# Patient Record
Sex: Male | Born: 1950 | Race: White | Hispanic: No | Marital: Married
Health system: Midwestern US, Community
[De-identification: ages and names within clinical notes are randomized; demographics above are authoritative.]

## PROBLEM LIST (undated history)

## (undated) DIAGNOSIS — E119 Type 2 diabetes mellitus without complications: Secondary | ICD-10-CM

## (undated) DIAGNOSIS — I10 Essential (primary) hypertension: Secondary | ICD-10-CM

## (undated) DIAGNOSIS — K219 Gastro-esophageal reflux disease without esophagitis: Secondary | ICD-10-CM

## (undated) DIAGNOSIS — E7800 Pure hypercholesterolemia, unspecified: Secondary | ICD-10-CM

## (undated) DIAGNOSIS — M48062 Spinal stenosis, lumbar region with neurogenic claudication: Secondary | ICD-10-CM

## (undated) DIAGNOSIS — I2510 Atherosclerotic heart disease of native coronary artery without angina pectoris: Secondary | ICD-10-CM

## (undated) DIAGNOSIS — R9439 Abnormal result of other cardiovascular function study: Principal | ICD-10-CM

## (undated) DIAGNOSIS — E782 Mixed hyperlipidemia: Secondary | ICD-10-CM

## (undated) DIAGNOSIS — Z955 Presence of coronary angioplasty implant and graft: Secondary | ICD-10-CM

## (undated) DIAGNOSIS — Z125 Encounter for screening for malignant neoplasm of prostate: Secondary | ICD-10-CM

## (undated) DIAGNOSIS — D649 Anemia, unspecified: Secondary | ICD-10-CM

## (undated) DIAGNOSIS — R1013 Epigastric pain: Secondary | ICD-10-CM

## (undated) DIAGNOSIS — F419 Anxiety disorder, unspecified: Principal | ICD-10-CM

## (undated) DIAGNOSIS — R7401 Elevation of levels of liver transaminase levels: Secondary | ICD-10-CM

## (undated) DIAGNOSIS — M5431 Sciatica, right side: Secondary | ICD-10-CM

## (undated) DIAGNOSIS — M5432 Sciatica, left side: Secondary | ICD-10-CM

## (undated) DIAGNOSIS — M5441 Lumbago with sciatica, right side: Secondary | ICD-10-CM

## (undated) DIAGNOSIS — I255 Ischemic cardiomyopathy: Secondary | ICD-10-CM

## (undated) DIAGNOSIS — M5442 Lumbago with sciatica, left side: Secondary | ICD-10-CM

## (undated) HISTORY — PX: KNEE SURGERY: SHX244

## (undated) HISTORY — PX: BACK SURGERY: SHX140

---

## 2017-10-08 LAB — COMPREHENSIVE METABOLIC PANEL
ALT: 13 U/L (ref 0–41)
AST: 14 U/L (ref 0–40)
Albumin/Globulin Ratio: 2.1 mmol/L — ABNORMAL HIGH (ref 1.00–2.00)
Albumin: 4.7 g/dL (ref 3.5–5.2)
Alk Phosphatase: 36 U/L — ABNORMAL LOW (ref 40–130)
Anion Gap: 14 mmol/L (ref 2–17)
BUN: 20 mg/dL (ref 8–23)
CO2: 27 mmol/L (ref 22–29)
Calcium: 9.7 mg/dL (ref 8.8–10.2)
Chloride: 101 mmol/L (ref 98–107)
Creatinine: 1 mg/dL (ref 0.7–1.3)
GFR African American: 89 mL/min/{1.73_m2} — ABNORMAL LOW (ref >=90)
GFR Non-African American: 77 mL/min/{1.73_m2} — ABNORMAL LOW (ref >=90)
Globulin: 2 g/dL (ref 1.9–4.4)
Glucose: 108 mg/dL — ABNORMAL HIGH (ref 70–99)
Potassium: 4.3 mmol/L (ref 3.5–5.3)
Sodium: 142 mmol/L (ref 135–145)
Total Bilirubin: 0.4 mg/dL (ref 0.00–1.20)
Total Protein: 6.9 g/dL (ref 6.4–8.3)

## 2017-10-08 LAB — HEMOGLOBIN A1C
Est. Avg. Glucose, WB: 120
Est. Avg. Glucose-calculated: 129
Hemoglobin A1C: 5.8 % (ref 4.0–6.0)

## 2017-10-08 LAB — LIPID PANEL
Chol/HDL Ratio: 3.2 (ref 0.0–4.4)
Cholesterol: 174 mg/dL (ref 100–200)
HDL: 54 mg/dL — ABNORMAL LOW (ref 55–72)
LDL Cholesterol: 96.2 mg/dL (ref 0.0–100.0)
LDL/HDL Ratio: 1.8
Triglycerides: 119 mg/dL (ref 0–149)
VLDL: 23.8 mg/dL (ref 5.0–40.0)

## 2018-01-21 LAB — COMPREHENSIVE METABOLIC PANEL
ALT: 15 U/L (ref 0–41)
AST: 18 U/L (ref 0–40)
Albumin/Globulin Ratio: 2.1 mmol/L — ABNORMAL HIGH (ref 1.00–2.00)
Albumin: 4.6 g/dL (ref 3.5–5.2)
Alk Phosphatase: 39 U/L — ABNORMAL LOW (ref 40–130)
Anion Gap: 12 mmol/L (ref 2–17)
BUN: 17 mg/dL (ref 8–23)
CO2: 26 mmol/L (ref 22–29)
Calcium: 9.7 mg/dL (ref 8.8–10.2)
Chloride: 103 mmol/L (ref 98–107)
Creatinine: 1 mg/dL (ref 0.7–1.3)
GFR African American: 89 mL/min/{1.73_m2} — ABNORMAL LOW (ref >=90)
GFR Non-African American: 76 mL/min/{1.73_m2} — ABNORMAL LOW (ref >=90)
Globulin: 2 g/dL (ref 1.9–4.4)
Glucose: 113 mg/dL — ABNORMAL HIGH (ref 70–99)
OSMOLALITY CALCULATED: 284 mOsm/kg (ref 270–287)
Potassium: 4.5 mmol/L (ref 3.5–5.3)
Sodium: 141 mmol/L (ref 135–145)
Total Bilirubin: 0.5 mg/dL (ref 0.00–1.20)
Total Protein: 6.8 g/dL (ref 6.4–8.3)

## 2018-01-21 LAB — CBC WITH AUTO DIFFERENTIAL
Absolute Baso #: 0 10*3/uL (ref 0.0–0.2)
Absolute Eos #: 0.1 10*3/uL (ref 0.0–0.5)
Absolute Lymph #: 1 10*3/uL (ref 1.0–3.2)
Absolute Mono #: 0.3 10*3/uL (ref 0.3–1.0)
Basophils %: 0.7 % (ref 0.0–2.0)
Eosinophils %: 1.4 % (ref 0.0–7.0)
Hematocrit: 42 % (ref 38.0–52.0)
Hemoglobin: 13.7 g/dL (ref 12.0–17.3)
Immature Grans (Abs): 0 10*3/uL
Immature Granulocytes: 0.5 %
Lymphocytes: 23.3 % (ref 15.0–45.0)
MCH: 29.9 pg (ref 27.0–34.5)
MCHC: 32.6 g/dL (ref 32.0–36.0)
MCV: 91.7 fL (ref 84.0–100.0)
MPV: 11.7 fL (ref 7.2–13.2)
Monocytes: 8 % (ref 4.0–12.0)
NRBC Absolute: 0 10*3/uL
NRBC Automated: 0 %
Neutrophils %: 66.1 % (ref 42.0–74.0)
Neutrophils Absolute: 2.8 10*3/uL (ref 1.6–7.3)
Platelets: 178 10*3/uL (ref 140–440)
RBC: 4.58 x10e6/mcL (ref 4.00–5.20)
RDW: 11.8 % (ref 11.0–16.0)
WBC: 4.3 10*3/uL (ref 3.8–10.6)

## 2018-01-21 LAB — TSH WITH REFLEX TO FT4: TSH: 3.66 mcIU/mL (ref 0.358–3.740)

## 2018-01-21 LAB — LIPID PANEL
Chol/HDL Ratio: 2.9 (ref 0.0–4.4)
Cholesterol: 180 mg/dL (ref 100–200)
HDL: 62 mg/dL (ref 55–72)
LDL Cholesterol: 99 mg/dL (ref 0.0–100.0)
LDL/HDL Ratio: 1.6
Triglycerides: 95 mg/dL (ref 0–149)
VLDL: 19 mg/dL (ref 5.0–40.0)

## 2018-01-21 LAB — PSA SCREENING: Screening PSA: 1.13 ng/mL (ref 0.000–4.000)

## 2018-01-21 NOTE — Other (Signed)
Outpatient Cardiac Rehab Order       Outpatient Cardiac Rehabilitation Order Entered On:  03/08/2018 11:48 EST    Performed On:  03/08/2018 11:47 EST by Lenis Noon  AMY               Outpatient Cardiac Rehabilitation Order   Phase II Cardiac Rehab :   Phase II - Up to 12 weeks of continuous EKG telemetry monitoring during exercise and educational class series   Begin Cardiac Rehabilitation :   Patient is stable and cleared to begin Cardiac Rehabilitation immediately   Referring Physician :   Bennie Pierini,  AMY - 03/08/2018 11:47 EST   Cardiac Rehab Phase II   Myocardial Infarction :   Myocardial infarction   Myocardial Infarction Date :   02/13/2018 EST   PTCA/Stent :   PTCA/Stent   PTCA/Stent Date :   02/13/2018 EST   LEVINE,  AMY - 03/08/2018 11:47 EST    Signature Line     Electronically Signed on 03/08/2018 11:47 AM EST   ________________________________________________   Harden Mo      Electronically Signed on 03/08/2018 05:36 PM EST   ________________________________________________   Kathaleen Grinder, MD, Margit Banda.

## 2018-02-13 ENCOUNTER — Encounter (HOSPITAL_BASED_OUTPATIENT_CLINIC_OR_DEPARTMENT_OTHER): Payer: Self-pay | Admitting: Emergency Medicine

## 2018-02-13 ENCOUNTER — Other Ambulatory Visit: Payer: Self-pay

## 2018-02-13 ENCOUNTER — Inpatient Hospital Stay (HOSPITAL_BASED_OUTPATIENT_CLINIC_OR_DEPARTMENT_OTHER)
Admission: EM | Admit: 2018-02-13 | Discharge: 2018-02-16 | DRG: 247 | Disposition: A | Discharge status: Home / Self Care | Payer: Medicare Other | Attending: Cardiology | Admitting: Cardiology

## 2018-02-13 ENCOUNTER — Emergency Department (HOSPITAL_BASED_OUTPATIENT_CLINIC_OR_DEPARTMENT_OTHER): Payer: Medicare Other

## 2018-02-13 DIAGNOSIS — E7800 Pure hypercholesterolemia, unspecified: Secondary | ICD-10-CM | POA: Diagnosis not present

## 2018-02-13 DIAGNOSIS — Z7984 Long term (current) use of oral hypoglycemic drugs: Secondary | ICD-10-CM | POA: Diagnosis not present

## 2018-02-13 DIAGNOSIS — E785 Hyperlipidemia, unspecified: Secondary | ICD-10-CM | POA: Diagnosis present

## 2018-02-13 DIAGNOSIS — Z8249 Family history of ischemic heart disease and other diseases of the circulatory system: Secondary | ICD-10-CM

## 2018-02-13 DIAGNOSIS — I214 Non-ST elevation (NSTEMI) myocardial infarction: Secondary | ICD-10-CM | POA: Diagnosis present

## 2018-02-13 DIAGNOSIS — I493 Ventricular premature depolarization: Secondary | ICD-10-CM | POA: Diagnosis present

## 2018-02-13 DIAGNOSIS — I2510 Atherosclerotic heart disease of native coronary artery without angina pectoris: Secondary | ICD-10-CM

## 2018-02-13 DIAGNOSIS — Z8349 Family history of other endocrine, nutritional and metabolic diseases: Secondary | ICD-10-CM | POA: Diagnosis not present

## 2018-02-13 DIAGNOSIS — K219 Gastro-esophageal reflux disease without esophagitis: Secondary | ICD-10-CM | POA: Diagnosis present

## 2018-02-13 DIAGNOSIS — I10 Essential (primary) hypertension: Secondary | ICD-10-CM | POA: Diagnosis present

## 2018-02-13 DIAGNOSIS — I255 Ischemic cardiomyopathy: Secondary | ICD-10-CM | POA: Diagnosis present

## 2018-02-13 DIAGNOSIS — I2584 Coronary atherosclerosis due to calcified coronary lesion: Secondary | ICD-10-CM | POA: Diagnosis present

## 2018-02-13 DIAGNOSIS — E119 Type 2 diabetes mellitus without complications: Secondary | ICD-10-CM | POA: Diagnosis present

## 2018-02-13 DIAGNOSIS — Z888 Allergy status to other drugs, medicaments and biological substances status: Secondary | ICD-10-CM | POA: Diagnosis not present

## 2018-02-13 DIAGNOSIS — Z87891 Personal history of nicotine dependence: Secondary | ICD-10-CM | POA: Diagnosis not present

## 2018-02-13 DIAGNOSIS — Z833 Family history of diabetes mellitus: Secondary | ICD-10-CM | POA: Diagnosis not present

## 2018-02-13 DIAGNOSIS — Z955 Presence of coronary angioplasty implant and graft: Secondary | ICD-10-CM

## 2018-02-13 HISTORY — DX: Pure hypercholesterolemia, unspecified: E78.00

## 2018-02-13 HISTORY — DX: Gastro-esophageal reflux disease without esophagitis: K21.9

## 2018-02-13 HISTORY — DX: Type 2 diabetes mellitus without complications: E11.9

## 2018-02-13 HISTORY — DX: Essential (primary) hypertension: I10

## 2018-02-13 LAB — HEMOGLOBIN A1C
Hgb A1c MFr Bld: 5.3 % (ref 4.8–5.6)
Mean Plasma Glucose: 105.41 mg/dL

## 2018-02-13 LAB — COMPREHENSIVE METABOLIC PANEL
ALT: 17 U/L (ref 0–44)
AST: 24 U/L (ref 15–41)
Albumin: 4.5 g/dL (ref 3.5–5.0)
Alkaline Phosphatase: 38 U/L (ref 38–126)
Anion gap: 9 (ref 5–15)
BUN: 20 mg/dL (ref 8–23)
CO2: 26 mmol/L (ref 22–32)
Calcium: 9.8 mg/dL (ref 8.9–10.3)
Chloride: 104 mmol/L (ref 98–111)
Creatinine, Ser: 1 mg/dL (ref 0.61–1.24)
GFR calc Af Amer: >60 mL/min (ref >60)
GFR calc non Af Amer: >60 mL/min (ref >60)
Glucose, Bld: 144 mg/dL — ABNORMAL HIGH (ref 70–99)
Potassium: 3.9 mmol/L (ref 3.5–5.1)
Sodium: 139 mmol/L (ref 135–145)
Total Bilirubin: 0.7 mg/dL (ref 0.3–1.2)
Total Protein: 7.7 g/dL (ref 6.5–8.1)

## 2018-02-13 LAB — CBC WITH DIFFERENTIAL/PLATELET
Abs Immature Granulocytes: 0.03 10*3/uL (ref 0.00–0.07)
Basophils Absolute: 0 10*3/uL (ref 0.0–0.1)
Basophils Relative: 1 %
EOS ABS: 0.1 10*3/uL (ref 0.0–0.5)
Eosinophils Relative: 1 %
HCT: 44.5 % (ref 39.0–52.0)
Hemoglobin: 14.3 g/dL (ref 13.0–17.0)
Immature Granulocytes: 1 %
LYMPHS ABS: 1.4 10*3/uL (ref 0.7–4.0)
Lymphocytes Relative: 23 %
MCH: 29.7 pg (ref 26.0–34.0)
MCHC: 32.1 g/dL (ref 30.0–36.0)
MCV: 92.5 fL (ref 80.0–100.0)
Monocytes Absolute: 0.6 10*3/uL (ref 0.1–1.0)
Monocytes Relative: 10 %
Neutro Abs: 3.8 10*3/uL (ref 1.7–7.7)
Neutrophils Relative %: 64 %
Platelets: 173 10*3/uL (ref 150–400)
RBC: 4.81 MIL/uL (ref 4.22–5.81)
RDW: 11.9 % (ref 11.5–15.5)
WBC: 5.9 10*3/uL (ref 4.0–10.5)
nRBC: 0 % (ref 0.0–0.2)

## 2018-02-13 LAB — GLUCOSE, CAPILLARY
Glucose-Capillary: 102 mg/dL — ABNORMAL HIGH (ref 70–99)
Glucose-Capillary: 96 mg/dL (ref 70–99)

## 2018-02-13 LAB — TROPONIN I
Troponin I: 0.57 ng/mL (ref <0.03)
Troponin I: 7.54 ng/mL (ref <0.03)

## 2018-02-13 LAB — TSH: TSH: 3.837 u[IU]/mL (ref 0.350–4.500)

## 2018-02-13 LAB — BRAIN NATRIURETIC PEPTIDE: B Natriuretic Peptide: 98.7 pg/mL (ref 0.0–100.0)

## 2018-02-13 LAB — HEPARIN LEVEL (UNFRACTIONATED): Heparin Unfractionated: 0.4 IU/mL (ref 0.30–0.70)

## 2018-02-13 MED ORDER — NITROGLYCERIN 2 % TD OINT
1.0000 [in_us] | TOPICAL_OINTMENT | Freq: Once | TRANSDERMAL | Status: AC
  Administered 2018-02-13: 1 [in_us] via TOPICAL
  Filled 2018-02-13: qty 1

## 2018-02-13 MED ORDER — METOPROLOL TARTRATE 25 MG PO TABS
25.0000 mg | ORAL_TABLET | Freq: Two times a day (BID) | ORAL | Status: DC
  Administered 2018-02-13 – 2018-02-14 (×3): 25 mg via ORAL
  Filled 2018-02-13 (×3): qty 1

## 2018-02-13 MED ORDER — ASPIRIN 81 MG PO CHEW
324.0000 mg | CHEWABLE_TABLET | Freq: Once | ORAL | Status: AC
  Administered 2018-02-13: 324 mg via ORAL
  Filled 2018-02-13: qty 4

## 2018-02-13 MED ORDER — ACETAMINOPHEN 325 MG PO TABS
650.0000 mg | ORAL_TABLET | ORAL | Status: DC | PRN
  Administered 2018-02-13 – 2018-02-15 (×3): 650 mg via ORAL
  Filled 2018-02-13 (×3): qty 2

## 2018-02-13 MED ORDER — FENTANYL CITRATE (PF) 100 MCG/2ML IJ SOLN
50.0000 ug | Freq: Once | INTRAMUSCULAR | Status: AC
  Administered 2018-02-13: 50 ug via INTRAVENOUS
  Filled 2018-02-13: qty 2

## 2018-02-13 MED ORDER — ALUM & MAG HYDROXIDE-SIMETH 200-200-20 MG/5ML PO SUSP: 30.0000 mL | Freq: Once | ORAL | Status: DC

## 2018-02-13 MED ORDER — ZOLPIDEM TARTRATE 5 MG PO TABS: 5.0000 mg | ORAL_TABLET | Freq: Every evening | ORAL | Status: DC | PRN

## 2018-02-13 MED ORDER — INSULIN ASPART 100 UNIT/ML ~~LOC~~ SOLN: 0.0000 [IU] | Freq: Three times a day (TID) | SUBCUTANEOUS | Status: DC

## 2018-02-13 MED ORDER — NITROGLYCERIN 0.4 MG SL SUBL
0.4000 mg | SUBLINGUAL_TABLET | SUBLINGUAL | Status: AC | PRN
  Administered 2018-02-13 (×3): 0.4 mg via SUBLINGUAL
  Filled 2018-02-13: qty 1

## 2018-02-13 MED ORDER — ASPIRIN EC 81 MG PO TBEC
81.0000 mg | DELAYED_RELEASE_TABLET | Freq: Every day | ORAL | Status: DC
  Administered 2018-02-14 – 2018-02-16 (×2): 81 mg via ORAL
  Filled 2018-02-13 (×2): qty 1

## 2018-02-13 MED ORDER — HEPARIN (PORCINE) 25000 UT/250ML-% IV SOLN
1400.0000 [IU]/h | INTRAVENOUS | Status: DC
  Administered 2018-02-13 – 2018-02-14 (×2): 1400 [IU]/h via INTRAVENOUS
  Filled 2018-02-13 (×3): qty 250

## 2018-02-13 MED ORDER — LIDOCAINE VISCOUS HCL 2 % MT SOLN: 15.0000 mL | Freq: Once | OROMUCOSAL | Status: DC

## 2018-02-13 MED ORDER — ONDANSETRON HCL 4 MG/2ML IJ SOLN: 4.0000 mg | Freq: Four times a day (QID) | INTRAMUSCULAR | Status: DC | PRN

## 2018-02-13 MED ORDER — ATORVASTATIN CALCIUM 80 MG PO TABS
80.0000 mg | ORAL_TABLET | Freq: Every day | ORAL | Status: DC
  Administered 2018-02-13: 80 mg via ORAL
  Filled 2018-02-13: qty 1

## 2018-02-13 MED ORDER — INSULIN ASPART 100 UNIT/ML ~~LOC~~ SOLN: 0.0000 [IU] | Freq: Every day | SUBCUTANEOUS | Status: DC

## 2018-02-13 MED ORDER — NITROGLYCERIN 2 % TD OINT
0.5000 [in_us] | TOPICAL_OINTMENT | Freq: Four times a day (QID) | TRANSDERMAL | Status: DC
  Administered 2018-02-13 – 2018-02-15 (×6): 0.5 [in_us] via TOPICAL
  Filled 2018-02-13: qty 30

## 2018-02-13 MED ORDER — ALPRAZOLAM 0.25 MG PO TABS
0.2500 mg | ORAL_TABLET | Freq: Two times a day (BID) | ORAL | Status: DC | PRN
  Administered 2018-02-15 (×2): 0.25 mg via ORAL
  Filled 2018-02-13 (×2): qty 1

## 2018-02-13 MED ORDER — NITROGLYCERIN 0.4 MG SL SUBL: 0.4000 mg | SUBLINGUAL_TABLET | SUBLINGUAL | Status: DC | PRN

## 2018-02-13 MED ORDER — HEPARIN BOLUS VIA INFUSION
4000.0000 [IU] | Freq: Once | INTRAVENOUS | Status: AC
  Administered 2018-02-13: 4000 [IU] via INTRAVENOUS

## 2018-02-13 NOTE — ED Notes (Signed)
No repeat trp needed per EDP 

## 2018-02-13 NOTE — ED Notes (Signed)
Attempted report x1. 

## 2018-02-13 NOTE — Progress Notes (Signed)
Critical Troponin 7.54 paged md denies pain, ordered EKG

## 2018-02-13 NOTE — H&P (Signed)
Cardiology Admission History and Physical:   Patient ID: Patrick EmmsFrank Brame; MRN: [id number]; DOB: 01/21/1951   Admission date: 02/13/2018  Primary Care Provider: System, Pcp Not In Primary Cardiologist: No primary care provider on file. New Primary Electrophysiologist:  None   Chief Complaint:  NSTEMI  Patient Profile:   Patrick Crane is a 67 y.o. male with a history of DM, HTN, HLD.  He [facility] for chest pain.  Cardiac enzymes were elevated, indicating a non-STEMI.  He was transferred to [facility].  History of Present Illness:   Mr. Martha ClanHutto is here with his wife. [place] from [place]. He has never seen cardiology before.   He was wakened by chest pain at midnight last pm. Aching pain, 6/10 worst. Rolaids no help. Position change no help. Went downstairs, walking did not change the pain. No SOB, N&V or diaphoresis.   When sx did not resolve, [facility]. He got ASA 81 mg x 4, nitro x 3 then nitro paste. Pain was relieved after that. He had earlier gotten fentanyl 50 mcg IV, but feels the nitro helped more. Is on hep gtt. Has been pain-free since the nitro. He got HA from the nitro.  He had been wakened by chest pain a week earlier, it came and went but it eventually went away.   His exercise has been decreased by the holidays, but walked twice for 30" between Tday and Xmas, no CP or SOB w/ this.   Has been busy during the holidays, has not felt limited by chest pain or SOB. He was chopping wood a week ago, did not have any sx then. Initial CP episode was a couple of days later.    Past Medical History:  Diagnosis Date  . Diabetes mellitus without complication (HCC)    A1c is generally normal.  . GERD (gastroesophageal reflux disease)   . High cholesterol    treated  . Hypertension    Not on rx in > 2 years after 60 lb wt loss.    Past Surgical History:  Procedure Laterality Date  . BACK SURGERY    . KNEE SURGERY       Medications Prior to Admission: Prior to Admission medications   pending     Allergies:   No Known Allergies  Social History:   Social History   Socioeconomic History  . Marital status: Married    Spouse name: Not on file  . Number of children: Not on file  . Years of education: Not on file  . Highest education level: Not on file  Occupational History  . Occupation: Retired  Engineer, productionocial Needs  . Financial resource strain: Not on file  . Food insecurity:    Worry: Not on file    Inability: Not on file  . Transportation needs:    Medical: Not on file    Non-medical: Not on file  Tobacco Use  . Smoking status: Former Smoker    Packs/day: 1.00    Types: Cigarettes    Last attempt to quit: 02/17/1982    Years since quitting: 36.0  . Smokeless tobacco: Never Used  Substance and Sexual Activity  . Alcohol use: Yes    Comment: occ  . Drug use: Never  . Sexual activity: Not on file  Lifestyle  . Physical activity:    Days per week: Not on file    Minutes per session: Not on file  . Stress: Not  on file  Relationships  . Social connections:    Talks on phone: Not on file    Gets together: Not on file    Attends religious service: Not on file    Active member of club or organization: Not on file    Attends meetings of clubs or organizations: Not on file    Relationship status: Not on file  . Intimate partner violence:    Fear of current or ex partner: Not on file    Emotionally abused: Not on file    Physically abused: Not on file    Forced sexual activity: Not on file  Other Topics Concern  . Not on file  Social History Narrative   [place]    Family History:   The patient's family history includes CAD in his maternal grandfather; CAD (age of onset: 9189) in his mother; Diabetes (age of onset: 2873) in his father; Hyperlipidemia in his mother; Hypertension in his mother.   The patient He indicated that his mother is deceased. He indicated that his  father is deceased. He indicated that his maternal grandfather is deceased.    ROS:  Please see the history of present illness.  All other ROS reviewed and negative.     Physical Exam/Data:   Vitals:   02/13/18 0930 02/13/18 1100 02/13/18 1230 02/13/18 1330  BP: (!) 144/93 136/89 (!) 164/93 (!) 130/91  Pulse: 73 72 66 64  Resp: 15 15 12 17   Temp:      TempSrc:      SpO2: 95% 95% 97% 94%  Weight:      Height:       No intake or output data in the 24 hours ending 02/13/18 1702 Filed Weights   02/13/18 0547  Weight: 122.5 kg   Body mass index is 32.87 kg/m.  General:  Well nourished, well developed, in no acute distress HEENT: normal Lymph: no adenopathy Neck:  JVD not elevated Endocrine:  No thryomegaly Vascular: No carotid bruits; FA pulses 2+ bilaterally  Cardiac:  normal S1, S2; RRR; murmur, no rub or gallop  Lungs:  clear to auscultation bilaterally, no wheezing, rhonchi or rales  Abd: soft, nontender, no hepatomegaly  Ext: no edema Musculoskeletal:  No deformities, BUE and BLE strength normal and equal Skin: warm and dry  Neuro:  CNs 2-12 intact, no focal abnormalities noted Psych:  Normal affect    EKG:  The ECG that was done 12/28, is SR, HR 91, ST depression noted anterior leads, morphology changes from earlier ECG, personally reviewed   Relevant CV Studies:  None  Laboratory Data:  Chemistry Recent Labs  Lab 02/13/18 0505  NA 139  K 3.9  CL 104  CO2 26  GLUCOSE 144*  BUN 20  CREATININE 1.00  CALCIUM 9.8  GFRNONAA >60  GFRAA >60  ANIONGAP 9    Recent Labs  Lab 02/13/18 0505  PROT 7.7  ALBUMIN 4.5  AST 24  ALT 17  ALKPHOS 38  BILITOT 0.7   Hematology Recent Labs  Lab 02/13/18 0505  WBC 5.9  RBC 4.81  HGB 14.3  HCT 44.5  MCV 92.5  MCH 29.7  MCHC 32.1  RDW 11.9  PLT 173   Cardiac Enzymes Recent Labs  Lab 02/13/18 0505  TROPONINI 0.57*     BNP Recent Labs  Lab 02/13/18 0505  BNP 98.7     Radiology/Studies:  Dg  Chest 2 View  Result Date: 02/13/2018 CLINICAL DATA:  Acute onset of generalized chest pain. EXAM: CHEST - 2 VIEW COMPARISON:  None. FINDINGS: The lungs are well-aerated. Vascular congestion is noted. Increased interstitial markings raise concern for mild interstitial edema. There is no evidence of pleural effusion or pneumothorax. The heart is normal in size; the mediastinal contour is within normal limits. No acute osseous abnormalities are seen. IMPRESSION: Vascular congestion. Increased interstitial markings raise concern for mild interstitial edema. Electronically Signed   By: Roanna Raider M.D.   On: 02/13/2018 05:27    Assessment and Plan:   Principal Problem: 1.  NSTEMI (non-ST elevated myocardial infarction) (HCC) -Initial cardiac enzymes are elevated, symptoms relieved by nitrates -Currently pain-free. -Continue heparin, nitrates, add beta-blocker and high-dose statin - Cardiac cath on Monday. - Risks benefits of a cardiac catheterization including, but not limited to, death, stroke, MI, kidney damage and bleeding were discussed with the patient who indicates understanding and agrees to proceed.  -His wife and son are present and agree with the plan  Active Problems: 2.  Hypertension -He has not been on blood pressure medication since weight loss - Add beta-blocker and see how tolerated  3.  High cholesterol -History of hyperlipidemia, treated -Check a lipid profile and add high-dose statin  4.  Diabetes mellitus without complication (HCC) -Add sliding scale insulin and hold metformin while in the hospital  5.  GERD (gastroesophageal reflux disease) - We will go ahead and start on Protonix if he is not on anything at home.   Severity of Illness: The appropriate patient status for this patient is INPATIENT. Inpatient status is judged to be reasonable and necessary in order to provide the required intensity of service to ensure the patient's safety. The patient's presenting  symptoms, physical exam findings, and initial radiographic and laboratory data in the context of their chronic comorbidities is felt to place them at high risk for further clinical deterioration. Furthermore, it is not anticipated that the patient will be medically stable for discharge from the hospital within 2 midnights of admission. The following factors support the patient status of inpatient.   " The patient's presenting symptoms include Chest pain. " The worrisome physical exam findings include abnl ECG. " The initial radiographic and laboratory data are worrisome because of elevated troponin. " The chronic co-morbidities include DM   * I certify that at the point of admission it is my clinical judgment that the patient will require inpatient hospital care spanning beyond 2 midnights from the point of admission due to high intensity of service, high risk for further deterioration and high frequency of surveillance required.*    For questions or updates, please contact [facility] Please consult [email or web address] for contact info under Cardiology/STEMI.    SignedTheodore Demark, PA-C  02/13/2018 5:02 PM

## 2018-02-13 NOTE — Progress Notes (Signed)
ANTICOAGULATION CONSULT NOTE - Initial Consult  Pharmacy Consult for heparin Indication: chest pain/ACS  No Known Allergies  Patient Measurements: Height: 6\' 4"  (193 cm) Weight: 270 lb (122.5 kg) IBW/kg (Calculated) : 86.8 Heparin Dosing Weight: 112.7 kg  Vital Signs: Temp: 98 F (36.7 C) (12/28 1643) Temp Source: Oral (12/28 1643) BP: 174/84 (12/28 1643) Pulse Rate: 78 (12/28 1643)  Labs: Recent Labs    02/13/18 0505 02/13/18 1622  HGB 14.3  --   HCT 44.5  --   PLT 173  --   HEPARINUNFRC  --  0.40  CREATININE 1.00  --   TROPONINI 0.57*  --     Estimated Creatinine Clearance: 99.7 mL/min (by C-G formula based on SCr of 1 mg/dL).  Assessment: 67 yo man with CP to start heparin  Initial hep lvl within goal 0.4  Goal of Therapy:  Heparin level 0.3-0.7 units/ml Monitor platelets by anticoagulation protocol: Yes   Plan:  Heparin drip 1400 units/hr Daily hep lvl cbc  Isaac BlissMichael Colvin Blatt, PharmD, BCPS, BCCCP Clinical Pharmacist [phone]  Please check AMION for all [facility] numbers  02/13/2018 5:34 PM

## 2018-02-13 NOTE — ED Triage Notes (Signed)
Pt is c/o chest pain that started about midnight tonight  Pt states the pain is throughout his chest  States he had some pain yesterday but it went away  Denies any other symptoms at this time

## 2018-02-13 NOTE — Progress Notes (Signed)
ANTICOAGULATION CONSULT NOTE - Initial Consult  Pharmacy Consult for heparin Indication: chest pain/ACS  No Known Allergies  Patient Measurements:   Heparin Dosing Weight: 112.7 kg  Vital Signs: Temp: 98.3 F (36.8 C) (12/28 0509) Temp Source: Oral (12/28 0509) BP: 163/88 (12/28 0509) Pulse Rate: 88 (12/28 0509)  Labs: Recent Labs    02/13/18 0505  HGB 14.3  HCT 44.5  PLT 173  CREATININE 1.00  TROPONINI 0.57*    CrCl cannot be calculated (Unknown ideal weight.).   Medical History: Past Medical History:  Diagnosis Date  . Diabetes mellitus without complication (HCC)   . High cholesterol   . Hypertension     Medications:  See medication history  Assessment: 67 yo man with CP to start heparin Goal of Therapy:  Heparin level 0.3-0.7 units/ml Monitor platelets by anticoagulation protocol: Yes   Plan:  Heparin 4000 unit bolus and drip at 1400 units/hr Check heparin level 6-8 hours after start and daily Daily CBC Monitor for bleeding complications  Lynzi Meulemans Poteet 02/13/2018,5:47 AM

## 2018-02-13 NOTE — ED Notes (Signed)
ED Provider at bedside. 

## 2018-02-13 NOTE — ED Notes (Signed)
Date and time results received: 02/13/18 0538 (use smartphrase ".now" to insert current time)  Test: troponin Critical Value: 0.57  Name of Provider Notified: Dr. Eudelia Bunchardama  Orders Received? Or Actions Taken?: awaiting further orders

## 2018-02-13 NOTE — ED Notes (Signed)
Patient transported to X-ray 

## 2018-02-13 NOTE — ED Provider Notes (Signed)
[facility] EMERGENCY DEPARTMENT Provider Note  CSN: [id number] Arrival date & time: 02/13/18 0451  Chief Complaint(s) Chest Pain  HPI Patrick Crane is a 67 y.o. male   The history is provided by the patient.  Chest Pain   This is a recurrent problem. Episode onset: 5 days. Episode frequency: intermittent. Progression since onset: fluctutating. Associated with: mostly while lying down. The pain is present in the substernal region. The pain is moderate. The quality of the pain is described as dull. The pain does not radiate. Exacerbated by: lying. Pertinent negatives include no cough, no diaphoresis, no exertional chest pressure, no irregular heartbeat, no leg pain, no lower extremity edema, no nausea, no shortness of breath and no vomiting. Treatments tried: improves with burping and sitting up. The treatment provided moderate relief. Risk factors include being elderly.  His past medical history is significant for diabetes, hyperlipidemia and hypertension.  Pertinent negatives for past medical history include no CAD.  Procedure history is negative for cardiac catheterization and exercise treadmill test.    Past Medical History Past Medical History:  Diagnosis Date  . Diabetes mellitus without complication (HCC)   . High cholesterol   . Hypertension    There are no active problems to display for this patient.  Home Medication(s) Prior to Admission medications   Not on File                                                                                                                                    Past Surgical History Past Surgical History:  Procedure Laterality Date  . BACK SURGERY    . KNEE SURGERY     Family History Family History  Problem Relation Age of Onset  . Hyperlipidemia Mother   . Hypertension Mother   . CAD Mother   . Diabetes Father     Social History Social History   Tobacco Use  . Smoking status: Former Games developer  . Smokeless tobacco:  Never Used  Substance Use Topics  . Alcohol use: Yes    Comment: occ  . Drug use: Never   Allergies Patient has no known allergies.  Review of Systems Review of Systems  Constitutional: Negative for diaphoresis.  Respiratory: Negative for cough and shortness of breath.   Cardiovascular: Positive for chest pain.  Gastrointestinal: Negative for nausea and vomiting.   All other systems are reviewed and are negative for acute change except as noted in the HPI  Physical Exam Vital Signs  I have reviewed the triage vital signs BP (!) 163/88 (BP Location: Right Arm)   Pulse 88   Temp 98.3 F (36.8 C) (Oral)   Resp 18   SpO2 96%   Physical Exam Vitals signs reviewed.  Constitutional:      General: He is not in acute distress.    Appearance: He is well-developed. He is not diaphoretic.  HENT:     Head: Normocephalic  and atraumatic.     Nose: Nose normal.  Eyes:     General: No scleral icterus.       Right eye: No discharge.        Left eye: No discharge.     Conjunctiva/sclera: Conjunctivae normal.     Pupils: Pupils are equal, round, and reactive to light.  Neck:     Musculoskeletal: Normal range of motion and neck supple.  Cardiovascular:     Rate and Rhythm: Normal rate. Rhythm regularly irregular.     Heart sounds: No murmur. No friction rub. No gallop.   Pulmonary:     Effort: Pulmonary effort is normal. No respiratory distress.     Breath sounds: Normal breath sounds. No stridor. No rales.  Abdominal:     General: There is no distension.     Palpations: Abdomen is soft.     Tenderness: There is no abdominal tenderness.  Musculoskeletal:        General: No tenderness.  Skin:    General: Skin is warm and dry.     Findings: No erythema or rash.  Neurological:     Mental Status: He is alert and oriented to person, place, and time.     ED Results and Treatments Labs (all labs ordered are listed, but only abnormal results are displayed) Labs Reviewed    COMPREHENSIVE METABOLIC PANEL - Abnormal; Notable for the following components:      Result Value   Glucose, Bld 144 (*)    All other components within normal limits  TROPONIN I - Abnormal; Notable for the following components:   Troponin I 0.57 (*)    All other components within normal limits  CBC WITH DIFFERENTIAL/PLATELET  BRAIN NATRIURETIC PEPTIDE                                                                                                                         EKG  EKG Interpretation  Date/Time:  Saturday February 13 2018 05:01:20 EST Ventricular Rate:  88 PR Interval:    QRS Duration: 112 QT Interval:  389 QTC Calculation: 471 R Axis:   74 Text Interpretation:  Sinus rhythm Multiple ventricular premature complexes Short PR interval Borderline intraventricular conduction delay Borderline ST depression, anterior leads. No old tracing to compare Confirmed by Drema Pryardama, Pedro [phone]([id number]) on 02/13/2018 5:51:59 AM       EKG Interpretation  Date/Time:  Saturday February 13 2018 05:47:47 EST Ventricular Rate:  91 PR Interval:    QRS Duration: 91 QT Interval:  300 QTC Calculation: 369 R Axis:   74 Text Interpretation:  POSTERIOR EKG Sinus rhythm Abnormal lateral Q waves ST depr, consider ischemia, anterolateral lds Baseline wander in lead(s) III aVF NO STEMI Confirmed by Drema Pryardama, Pedro [phone]) on 02/13/2018 5:53:11 AM        Radiology Dg Chest 2 View  Result Date: 02/13/2018 CLINICAL DATA:  Acute onset of generalized chest pain. EXAM: CHEST - 2 VIEW COMPARISON:  None. FINDINGS: The  lungs are well-aerated. Vascular congestion is noted. Increased interstitial markings raise concern for mild interstitial edema. There is no evidence of pleural effusion or pneumothorax. The heart is normal in size; the mediastinal contour is within normal limits. No acute osseous abnormalities are seen. IMPRESSION: Vascular congestion. Increased interstitial markings raise concern for mild  interstitial edema. Electronically Signed   By: Roanna RaiderJeffery  Chang M.D.   On: 02/13/2018 05:27   Pertinent labs & imaging results that were available during my care of the patient were reviewed by me and considered in my medical decision making (see chart for details).  Medications Ordered in ED Medications  nitroGLYCERIN (NITROSTAT) SL tablet 0.4 mg (has no administration in time range)  aspirin chewable tablet 324 mg (has no administration in time range)  fentaNYL (SUBLIMAZE) injection 50 mcg (has no administration in time range)  heparin bolus via infusion 4,000 Units (has no administration in time range)  heparin ADULT infusion 100 units/mL (25000 units/28150mL sodium chloride 0.45%) (has no administration in time range)                                                                                                                                    Procedures .Critical Care Performed by: Nira Connardama, Pedro Eduardo, MD Authorized by: Nira Connardama, Pedro Eduardo, MD     CRITICAL CARE Performed by: Amadeo GarnetPedro Eduardo Cardama Total critical care time: 35 minutes Critical care time was exclusive of separately billable procedures and treating other patients. Critical care was necessary to treat or prevent imminent or life-threatening deterioration. Critical care was time spent personally by me on the following activities: development of treatment plan with patient and/or surrogate as well as nursing, discussions with consultants, evaluation of patient's response to treatment, examination of patient, obtaining history from patient or surrogate, ordering and performing treatments and interventions, ordering and review of laboratory studies, ordering and review of radiographic studies, pulse oximetry and re-evaluation of patient's condition.   (including critical care time)  Medical Decision Making / ED Course I have reviewed the nursing notes for this encounter and the patient's prior records (if available in  EHR or on provided paperwork).    Patient presents with 5 days of intermittent substernal chest pain.  This episode has been ongoing for 6 hours.  EKG with ST depression in the anterior leads.  Posterior EKG without evidence of ST segment elevation MI.  Initial troponin positive, consistent with NSTEMI.  Chest x-ray concerning for pulmonary edema. Not suspicious for aortic dissection. Patient is satting well on room air.  BNP added.  Patient given 325 of aspirin, nitroglycerin, and started on heparin bolus.  Will discuss case with cardiology for admission and continued management.    Final Clinical Impression(s) / ED Diagnoses Final diagnoses:  NSTEMI (non-ST elevated myocardial infarction) [facility](HCC)      This chart was dictated using voice recognition software.  Despite best efforts to proofread,  errors can occur  which can change the documentation meaning.   Nira Conn, MD 02/13/18 [phone]

## 2018-02-13 NOTE — ED Notes (Signed)
Posterior EKG per Provider

## 2018-02-14 DIAGNOSIS — I214 Non-ST elevation (NSTEMI) myocardial infarction: Principal | ICD-10-CM

## 2018-02-14 DIAGNOSIS — I10 Essential (primary) hypertension: Secondary | ICD-10-CM

## 2018-02-14 DIAGNOSIS — E119 Type 2 diabetes mellitus without complications: Secondary | ICD-10-CM

## 2018-02-14 DIAGNOSIS — E7800 Pure hypercholesterolemia, unspecified: Secondary | ICD-10-CM

## 2018-02-14 LAB — COMPREHENSIVE METABOLIC PANEL
ALBUMIN: 3.7 g/dL (ref 3.5–5.0)
ALT: 19 U/L (ref 0–44)
AST: 37 U/L (ref 15–41)
Alkaline Phosphatase: 30 U/L — ABNORMAL LOW (ref 38–126)
Anion gap: 9 (ref 5–15)
BUN: 14 mg/dL (ref 8–23)
CHLORIDE: 104 mmol/L (ref 98–111)
CO2: 27 mmol/L (ref 22–32)
Calcium: 9.3 mg/dL (ref 8.9–10.3)
Creatinine, Ser: 1.07 mg/dL (ref 0.61–1.24)
GFR calc Af Amer: >60 mL/min (ref >60)
GFR calc non Af Amer: >60 mL/min (ref >60)
GLUCOSE: 114 mg/dL — AB (ref 70–99)
Potassium: 3.8 mmol/L (ref 3.5–5.1)
SODIUM: 140 mmol/L (ref 135–145)
Total Bilirubin: 0.8 mg/dL (ref 0.3–1.2)
Total Protein: 6.4 g/dL — ABNORMAL LOW (ref 6.5–8.1)

## 2018-02-14 LAB — TROPONIN I
Troponin I: 5.45 ng/mL (ref <0.03)
Troponin I: 6.57 ng/mL (ref <0.03)

## 2018-02-14 LAB — GLUCOSE, CAPILLARY
Glucose-Capillary: 122 mg/dL — ABNORMAL HIGH (ref 70–99)
Glucose-Capillary: 123 mg/dL — ABNORMAL HIGH (ref 70–99)
Glucose-Capillary: 108 mg/dL — ABNORMAL HIGH (ref 70–99)
Glucose-Capillary: 133 mg/dL — ABNORMAL HIGH (ref 70–99)

## 2018-02-14 LAB — LIPID PANEL
Cholesterol: 158 mg/dL (ref 0–200)
HDL: 51 mg/dL (ref >40)
LDL Cholesterol: 84 mg/dL (ref 0–99)
Total CHOL/HDL Ratio: 3.1 RATIO
Triglycerides: 115 mg/dL (ref <150)
VLDL: 23 mg/dL (ref 0–40)

## 2018-02-14 LAB — CBC
HCT: 37.6 % — ABNORMAL LOW (ref 39.0–52.0)
Hemoglobin: 12.8 g/dL — ABNORMAL LOW (ref 13.0–17.0)
MCH: 31.2 pg (ref 26.0–34.0)
MCHC: 34 g/dL (ref 30.0–36.0)
MCV: 91.7 fL (ref 80.0–100.0)
PLATELETS: 154 10*3/uL (ref 150–400)
RBC: 4.1 MIL/uL — ABNORMAL LOW (ref 4.22–5.81)
RDW: 12 % (ref 11.5–15.5)
WBC: 7 10*3/uL (ref 4.0–10.5)
nRBC: 0 % (ref 0.0–0.2)

## 2018-02-14 LAB — PROTIME-INR
INR: 1.06
Prothrombin Time: 13.7 seconds (ref 11.4–15.2)

## 2018-02-14 LAB — HEPARIN LEVEL (UNFRACTIONATED): Heparin Unfractionated: 0.41 IU/mL (ref 0.30–0.70)

## 2018-02-14 LAB — HIV ANTIBODY (ROUTINE TESTING W REFLEX): HIV SCREEN 4TH GENERATION: NONREACTIVE

## 2018-02-14 MED ORDER — SODIUM CHLORIDE 0.9 % WEIGHT BASED INFUSION
3.0000 mL/kg/h | INTRAVENOUS | Status: DC
  Administered 2018-02-15: 3 mL/kg/h via INTRAVENOUS

## 2018-02-14 MED ORDER — SODIUM CHLORIDE 0.9% FLUSH: 3.0000 mL | Freq: Two times a day (BID) | INTRAVENOUS | Status: DC

## 2018-02-14 MED ORDER — ASPIRIN 81 MG PO CHEW
81.0000 mg | CHEWABLE_TABLET | ORAL | Status: AC
  Administered 2018-02-15: 81 mg via ORAL
  Filled 2018-02-14: qty 1

## 2018-02-14 MED ORDER — SODIUM CHLORIDE 0.9 % IV SOLN: 250.0000 mL | INTRAVENOUS | Status: DC | PRN

## 2018-02-14 MED ORDER — SODIUM CHLORIDE 0.9 % WEIGHT BASED INFUSION
1.0000 mL/kg/h | INTRAVENOUS | Status: DC
  Administered 2018-02-15: 1 mL/kg/h via INTRAVENOUS

## 2018-02-14 MED ORDER — LOSARTAN POTASSIUM 50 MG PO TABS
25.0000 mg | ORAL_TABLET | Freq: Every day | ORAL | Status: DC
  Administered 2018-02-14 – 2018-02-16 (×3): 25 mg via ORAL
  Filled 2018-02-14 (×3): qty 1

## 2018-02-14 MED ORDER — SODIUM CHLORIDE 0.9% FLUSH: 3.0000 mL | INTRAVENOUS | Status: DC | PRN

## 2018-02-14 NOTE — H&P (View-Only) (Signed)
 Progress Note  Patient Name: Patrick Crane Date of Encounter: 02/14/2018  Primary Cardiologist: No primary care provider on file.   Subjective   Denies any further CP.  No SOB.  Trop peaked at 5.45  Inpatient Medications    Scheduled Meds: . aspirin EC  81 mg Oral Daily  . atorvastatin  80 mg Oral q1800  . insulin aspart  0-15 Units Subcutaneous TID WC  . insulin aspart  0-5 Units Subcutaneous QHS  . metoprolol tartrate  25 mg Oral BID  . nitroGLYCERIN  0.5 inch Topical Q6H   Continuous Infusions: . heparin 1,400 Units/hr (02/14/18 0600)   PRN Meds: acetaminophen, ALPRAZolam, nitroGLYCERIN, ondansetron (ZOFRAN) IV, zolpidem   Vital Signs    Vitals:   02/13/18 2009 02/13/18 2320 02/14/18 0412 02/14/18 0915  BP: (!) 152/81 (!) 150/95 120/63 (!) 156/96  Pulse: 77 74 71 71  Resp: 18 16 15 17  Temp: 97.8 F (36.6 C) 98.2 F (36.8 C) 97.6 F (36.4 C)   TempSrc: Oral Oral Oral   SpO2: 98% 97% 98% 96%  Weight:   122.5 kg   Height:        Intake/Output Summary (Last 24 hours) at 02/14/2018 1111 Last data filed at 02/14/2018 0600 Gross per 24 hour  Intake 690.59 ml  Output 1025 ml  Net -334.41 ml   Filed Weights   02/13/18 0547 02/14/18 0412  Weight: 122.5 kg 122.5 kg    Telemetry    NSR - Personally Reviewed  ECG    Sinus bradycardia with nonspecific ST abnormality - Personally Reviewed  Physical Exam   GEN: No acute distress.   Neck: No JVD Cardiac: RRR, no murmurs, rubs, or gallops.  Respiratory: Clear to auscultation bilaterally. GI: Soft, nontender, non-distended  MS: No edema; No deformity. Neuro:  Nonfocal  Psych: Normal affect   Labs    Chemistry Recent Labs  Lab 02/13/18 0505 02/14/18 0428  NA 139 140  K 3.9 3.8  CL 104 104  CO2 26 27  GLUCOSE 144* 114*  BUN 20 14  CREATININE 1.00 1.07  CALCIUM 9.8 9.3  PROT 7.7 6.4*  ALBUMIN 4.5 3.7  AST 24 37  ALT 17 19  ALKPHOS 38 30*  BILITOT 0.7 0.8  GFRNONAA >60 >60  GFRAA >60 >60   ANIONGAP 9 9     Hematology Recent Labs  Lab 02/13/18 0505 02/14/18 0428  WBC 5.9 7.0  RBC 4.81 4.10*  HGB 14.3 12.8*  HCT 44.5 37.6*  MCV 92.5 91.7  MCH 29.7 31.2  MCHC 32.1 34.0  RDW 11.9 12.0  PLT 173 154    Cardiac Enzymes Recent Labs  Lab 02/13/18 0505 02/13/18 1808 02/13/18 2331 02/14/18 0428  TROPONINI 0.57* 7.54* 6.57* 5.45*   No results for input(s): TROPIPOC in the last 168 hours.   BNP Recent Labs  Lab 02/13/18 0505  BNP 98.7     DDimer No results for input(s): DDIMER in the last 168 hours.   Radiology    Dg Chest 2 View  Result Date: 02/13/2018 CLINICAL DATA:  Acute onset of generalized chest pain. EXAM: CHEST - 2 VIEW COMPARISON:  None. FINDINGS: The lungs are well-aerated. Vascular congestion is noted. Increased interstitial markings raise concern for mild interstitial edema. There is no evidence of pleural effusion or pneumothorax. The heart is normal in size; the mediastinal contour is within normal limits. No acute osseous abnormalities are seen. IMPRESSION: Vascular congestion. Increased interstitial markings raise concern for mild interstitial edema.   Electronically Signed   By: Roanna RaiderJeffery  Chang M.D.   On: 02/13/2018 05:27    Cardiac Studies   none  Patient Profile     67 y.o. male with no prior cardiac hx but a hx of DM, HTN, hyperlipidemia, remote tobacco use and family hx of CAD.  He is visiting his children and is from [place] . About 2 weeks ago he had a brief episode of chest discomfort but did not seek medical attention.  Last night he awakened with CP that he described as an aching sensation 6/10 with radiation to his left arm but no associated SOB, N or diaphoresis.  This lasted about 6 hours and he [facility] where he ruled in for NSTEMI  Assessment & Plan    1.  NSTEMI -trop elevated at 0.57 on admission and now has peaked at 6.57 and trending downward with EKG showing ST depression in the anterior and lateral  leads -He has not had any further CP after ASA and NTG -Continue IV heparin gtt, nitropaste, ASA, BB, high dose statin -[facility]  2.  HTN -BP remains elevated on exam today -continue lopressor 25mg  BID -add losartan 25mg  daily in setting of DM  3.  Hyperlipidemia -he has not been on lipid lowering agent in the past -check FLP in am -continue high dose atorva  4.  DM -he was on Metformin at home -hold metformin for cath -BS checks q 6 hours with SS Insulin coverage -check HbA1c  For questions or updates, please contact [facility] Please consult [email or web address] for contact info under Cardiology/STEMI.      Signed, Armanda Magicraci Turner, MD  02/14/2018, 11:11 AM

## 2018-02-14 NOTE — Progress Notes (Signed)
ANTICOAGULATION CONSULT NOTE   Pharmacy Consult for heparin Indication: chest pain/ACS  No Known Allergies  Patient Measurements: Height: 6\' 4"  (193 cm) Weight: 270 lb 1 oz (122.5 kg) IBW/kg (Calculated) : 86.8 Heparin Dosing Weight: 112.7 kg  Vital Signs: Temp: 97.6 F (36.4 C) (12/29 0412) Temp Source: Oral (12/29 0412) BP: 156/96 (12/29 0915) Pulse Rate: 71 (12/29 0915)  Labs: Recent Labs    02/13/18 0505 02/13/18 1622 02/13/18 1808 02/13/18 2331 02/14/18 0428  HGB 14.3  --   --   --  12.8*  HCT 44.5  --   --   --  37.6*  PLT 173  --   --   --  154  LABPROT  --   --   --   --  13.7  INR  --   --   --   --  1.06  HEPARINUNFRC  --  0.40  --   --  0.41  CREATININE 1.00  --   --   --  1.07  TROPONINI 0.57*  --  7.54* 6.57* 5.45*    Estimated Creatinine Clearance: 93.2 mL/min (by C-G formula based on SCr of 1.07 mg/dL).  Assessment: 67 yo man with CP to start heparin. No anticoag PTA.   Heparin level came back therapeutic at 0.41, on 1400 units/hr. Hgb 12.8, plt 154. No s/sx of bleeding. No infusion issues.   Goal of Therapy:  Heparin level 0.3-0.7 units/ml Monitor platelets by anticoagulation protocol: Yes   Plan:  Continue heparin infusion at 1400 units/hr Monitor daily HL, CBC, for s/sx of bleeding  Sherron MondayKimberly Taylar Hartsough, PharmD, BCCCP Clinical Pharmacist  Pager: [phone] Phone: [phone] Please check AMION for all [facility] numbers 02/14/2018 10:10 AM

## 2018-02-14 NOTE — Progress Notes (Signed)
Progress Note  Patient Name: Estanislado EmmsFrank Vig Date of Encounter: 02/14/2018  Primary Cardiologist: No primary care provider on file.   Subjective   Denies any further CP.  No SOB.  Trop peaked at 5.45  Inpatient Medications    Scheduled Meds: . aspirin EC  81 mg Oral Daily  . atorvastatin  80 mg Oral q1800  . insulin aspart  0-15 Units Subcutaneous TID WC  . insulin aspart  0-5 Units Subcutaneous QHS  . metoprolol tartrate  25 mg Oral BID  . nitroGLYCERIN  0.5 inch Topical Q6H   Continuous Infusions: . heparin 1,400 Units/hr (02/14/18 0600)   PRN Meds: acetaminophen, ALPRAZolam, nitroGLYCERIN, ondansetron (ZOFRAN) IV, zolpidem   Vital Signs    Vitals:   02/13/18 2009 02/13/18 2320 02/14/18 0412 02/14/18 0915  BP: (!) 152/81 (!) 150/95 120/63 (!) 156/96  Pulse: 77 74 71 71  Resp: 18 16 15 17   Temp: 97.8 F (36.6 C) 98.2 F (36.8 C) 97.6 F (36.4 C)   TempSrc: Oral Oral Oral   SpO2: 98% 97% 98% 96%  Weight:   122.5 kg   Height:        Intake/Output Summary (Last 24 hours) at 02/14/2018 1111 Last data filed at 02/14/2018 0600 Gross per 24 hour  Intake 690.59 ml  Output 1025 ml  Net -334.41 ml   Filed Weights   02/13/18 0547 02/14/18 0412  Weight: 122.5 kg 122.5 kg    Telemetry    NSR - Personally Reviewed  ECG    Sinus bradycardia with nonspecific ST abnormality - Personally Reviewed  Physical Exam   GEN: No acute distress.   Neck: No JVD Cardiac: RRR, no murmurs, rubs, or gallops.  Respiratory: Clear to auscultation bilaterally. GI: Soft, nontender, non-distended  MS: No edema; No deformity. Neuro:  Nonfocal  Psych: Normal affect   Labs    Chemistry Recent Labs  Lab 02/13/18 0505 02/14/18 0428  NA 139 140  K 3.9 3.8  CL 104 104  CO2 26 27  GLUCOSE 144* 114*  BUN 20 14  CREATININE 1.00 1.07  CALCIUM 9.8 9.3  PROT 7.7 6.4*  ALBUMIN 4.5 3.7  AST 24 37  ALT 17 19  ALKPHOS 38 30*  BILITOT 0.7 0.8  GFRNONAA >60 >60  GFRAA >60 >60   ANIONGAP 9 9     Hematology Recent Labs  Lab 02/13/18 0505 02/14/18 0428  WBC 5.9 7.0  RBC 4.81 4.10*  HGB 14.3 12.8*  HCT 44.5 37.6*  MCV 92.5 91.7  MCH 29.7 31.2  MCHC 32.1 34.0  RDW 11.9 12.0  PLT 173 154    Cardiac Enzymes Recent Labs  Lab 02/13/18 0505 02/13/18 1808 02/13/18 2331 02/14/18 0428  TROPONINI 0.57* 7.54* 6.57* 5.45*   No results for input(s): TROPIPOC in the last 168 hours.   BNP Recent Labs  Lab 02/13/18 0505  BNP 98.7     DDimer No results for input(s): DDIMER in the last 168 hours.   Radiology    Dg Chest 2 View  Result Date: 02/13/2018 CLINICAL DATA:  Acute onset of generalized chest pain. EXAM: CHEST - 2 VIEW COMPARISON:  None. FINDINGS: The lungs are well-aerated. Vascular congestion is noted. Increased interstitial markings raise concern for mild interstitial edema. There is no evidence of pleural effusion or pneumothorax. The heart is normal in size; the mediastinal contour is within normal limits. No acute osseous abnormalities are seen. IMPRESSION: Vascular congestion. Increased interstitial markings raise concern for mild interstitial edema.  Electronically Signed   By: Roanna RaiderJeffery  Chang M.D.   On: 02/13/2018 05:27    Cardiac Studies   none  Patient Profile     67 y.o. male with no prior cardiac hx but a hx of DM, HTN, hyperlipidemia, remote tobacco use and family hx of CAD.  He is visiting his children and is from [place] . About 2 weeks ago he had a brief episode of chest discomfort but did not seek medical attention.  Last night he awakened with CP that he described as an aching sensation 6/10 with radiation to his left arm but no associated SOB, N or diaphoresis.  This lasted about 6 hours and he presented to [facility] where he ruled in for NSTEMI  Assessment & Plan    1.  NSTEMI -trop elevated at 0.57 on admission and now has peaked at 6.57 and trending downward with EKG showing ST depression in the anterior and lateral  leads -He has not had any further CP after ASA and NTG -Continue IV heparin gtt, nitropaste, ASA, BB, high dose statin -[facility]  2.  HTN -BP remains elevated on exam today -continue lopressor 25mg  BID -add losartan 25mg  daily in setting of DM  3.  Hyperlipidemia -he has not been on lipid lowering agent in the past -check FLP in am -continue high dose atorva  4.  DM -he was on Metformin at home -hold metformin for cath -BS checks q 6 hours with SS Insulin coverage -check HbA1c  For questions or updates, please contact [facility] Please consult [email or web address] for contact info under Cardiology/STEMI.      Signed, Armanda Magicraci Zariyah Stephens, MD  02/14/2018, 11:11 AM

## 2018-02-15 ENCOUNTER — Other Ambulatory Visit (HOSPITAL_COMMUNITY): Payer: Medicare Other

## 2018-02-15 ENCOUNTER — Encounter (HOSPITAL_COMMUNITY): Payer: Self-pay | Admitting: Internal Medicine

## 2018-02-15 ENCOUNTER — Encounter (HOSPITAL_COMMUNITY)
Admission: EM | Disposition: A | Discharge status: Home / Self Care | Payer: Self-pay | Source: Home / Self Care | Attending: Cardiology

## 2018-02-15 DIAGNOSIS — I2510 Atherosclerotic heart disease of native coronary artery without angina pectoris: Secondary | ICD-10-CM

## 2018-02-15 DIAGNOSIS — E785 Hyperlipidemia, unspecified: Secondary | ICD-10-CM

## 2018-02-15 HISTORY — PX: LEFT HEART CATH AND CORONARY ANGIOGRAPHY: CATH118249

## 2018-02-15 HISTORY — PX: CORONARY STENT INTERVENTION: CATH118234

## 2018-02-15 LAB — GLUCOSE, CAPILLARY
Glucose-Capillary: 108 mg/dL — ABNORMAL HIGH (ref 70–99)
Glucose-Capillary: 110 mg/dL — ABNORMAL HIGH (ref 70–99)
Glucose-Capillary: 105 mg/dL — ABNORMAL HIGH (ref 70–99)
Glucose-Capillary: 104 mg/dL — ABNORMAL HIGH (ref 70–99)
Glucose-Capillary: 84 mg/dL (ref 70–99)

## 2018-02-15 LAB — CBC
HCT: 39 % (ref 39.0–52.0)
HEMOGLOBIN: 12.8 g/dL — AB (ref 13.0–17.0)
MCH: 30.4 pg (ref 26.0–34.0)
MCHC: 32.8 g/dL (ref 30.0–36.0)
MCV: 92.6 fL (ref 80.0–100.0)
NRBC: 0 % (ref 0.0–0.2)
Platelets: 157 10*3/uL (ref 150–400)
RBC: 4.21 MIL/uL — AB (ref 4.22–5.81)
RDW: 12.1 % (ref 11.5–15.5)
WBC: 6.7 10*3/uL (ref 4.0–10.5)

## 2018-02-15 LAB — HEMOGLOBIN A1C
Hgb A1c MFr Bld: 5.3 % (ref 4.8–5.6)
Mean Plasma Glucose: 105.41 mg/dL

## 2018-02-15 LAB — HEPARIN LEVEL (UNFRACTIONATED): Heparin Unfractionated: 0.34 IU/mL (ref 0.30–0.70)

## 2018-02-15 LAB — POCT ACTIVATED CLOTTING TIME
Activated Clotting Time: 263 seconds
Activated Clotting Time: 268 seconds

## 2018-02-15 LAB — LIPID PANEL
CHOL/HDL RATIO: 2.9 ratio
Cholesterol: 151 mg/dL (ref 0–200)
HDL: 52 mg/dL (ref >40)
LDL Cholesterol: 78 mg/dL (ref 0–99)
Triglycerides: 103 mg/dL (ref <150)
VLDL: 21 mg/dL (ref 0–40)

## 2018-02-15 SURGERY — LEFT HEART CATH AND CORONARY ANGIOGRAPHY
Anesthesia: LOCAL

## 2018-02-15 MED ORDER — LABETALOL HCL 5 MG/ML IV SOLN: 10.0000 mg | INTRAVENOUS | Status: AC | PRN

## 2018-02-15 MED ORDER — ANGIOPLASTY BOOK
Freq: Once | Status: AC
  Administered 2018-02-15: 21:00:00
  Filled 2018-02-15: qty 1

## 2018-02-15 MED ORDER — CARVEDILOL 3.125 MG PO TABS
6.2500 mg | ORAL_TABLET | Freq: Two times a day (BID) | ORAL | Status: DC
  Administered 2018-02-15 – 2018-02-16 (×3): 6.25 mg via ORAL
  Filled 2018-02-15 (×3): qty 2

## 2018-02-15 MED ORDER — TICAGRELOR 90 MG PO TABS
ORAL_TABLET | ORAL | Status: AC
  Filled 2018-02-15: qty 2

## 2018-02-15 MED ORDER — ROSUVASTATIN CALCIUM 20 MG PO TABS
20.0000 mg | ORAL_TABLET | Freq: Every day | ORAL | Status: DC
  Administered 2018-02-15: 18:00:00 20 mg via ORAL
  Filled 2018-02-15: qty 1

## 2018-02-15 MED ORDER — VERAPAMIL HCL 2.5 MG/ML IV SOLN
INTRAVENOUS | Status: AC
  Filled 2018-02-15: qty 2

## 2018-02-15 MED ORDER — VERAPAMIL HCL 2.5 MG/ML IV SOLN
INTRAVENOUS | Status: DC | PRN
  Administered 2018-02-15: 10 mL via INTRA_ARTERIAL

## 2018-02-15 MED ORDER — THE SENSUOUS HEART BOOK
Freq: Once | Status: AC
  Administered 2018-02-15: 21:00:00
  Filled 2018-02-15: qty 1

## 2018-02-15 MED ORDER — SODIUM CHLORIDE 0.9% FLUSH
3.0000 mL | Freq: Two times a day (BID) | INTRAVENOUS | Status: DC
  Administered 2018-02-15 (×2): 3 mL via INTRAVENOUS

## 2018-02-15 MED ORDER — LIDOCAINE HCL (PF) 1 % IJ SOLN
INTRAMUSCULAR | Status: DC | PRN
  Administered 2018-02-15: 2 mL

## 2018-02-15 MED ORDER — ENOXAPARIN SODIUM 40 MG/0.4ML ~~LOC~~ SOLN
40.0000 mg | SUBCUTANEOUS | Status: DC
  Administered 2018-02-16: 09:00:00 40 mg via SUBCUTANEOUS
  Filled 2018-02-15: qty 0.4

## 2018-02-15 MED ORDER — FENTANYL CITRATE (PF) 100 MCG/2ML IJ SOLN
INTRAMUSCULAR | Status: AC
  Filled 2018-02-15: qty 2

## 2018-02-15 MED ORDER — HEPARIN (PORCINE) IN NACL 1000-0.9 UT/500ML-% IV SOLN
INTRAVENOUS | Status: AC
  Filled 2018-02-15: qty 1000

## 2018-02-15 MED ORDER — MIDAZOLAM HCL 2 MG/2ML IJ SOLN
INTRAMUSCULAR | Status: AC
  Filled 2018-02-15: qty 2

## 2018-02-15 MED ORDER — HYDRALAZINE HCL 20 MG/ML IJ SOLN: 5.0000 mg | INTRAMUSCULAR | Status: AC | PRN

## 2018-02-15 MED ORDER — LIDOCAINE HCL (PF) 1 % IJ SOLN
INTRAMUSCULAR | Status: AC
  Filled 2018-02-15: qty 30

## 2018-02-15 MED ORDER — NITROGLYCERIN 1 MG/10 ML FOR IR/CATH LAB
INTRA_ARTERIAL | Status: DC | PRN
  Administered 2018-02-15: 200 ug via INTRACORONARY

## 2018-02-15 MED ORDER — HEPARIN (PORCINE) IN NACL 1000-0.9 UT/500ML-% IV SOLN
INTRAVENOUS | Status: DC | PRN
  Administered 2018-02-15: 500 mL

## 2018-02-15 MED ORDER — IOHEXOL 350 MG/ML SOLN
INTRAVENOUS | Status: DC | PRN
  Administered 2018-02-15: 150 mL via INTRA_ARTERIAL

## 2018-02-15 MED ORDER — HEPARIN SODIUM (PORCINE) 1000 UNIT/ML IJ SOLN
INTRAMUSCULAR | Status: AC
  Filled 2018-02-15: qty 1

## 2018-02-15 MED ORDER — MIDAZOLAM HCL 2 MG/2ML IJ SOLN
INTRAMUSCULAR | Status: DC | PRN
  Administered 2018-02-15 (×3): 1 mg via INTRAVENOUS

## 2018-02-15 MED ORDER — SODIUM CHLORIDE 0.9 % IV SOLN: 250.0000 mL | INTRAVENOUS | Status: DC | PRN

## 2018-02-15 MED ORDER — SODIUM CHLORIDE 0.9 % IV SOLN
INTRAVENOUS | Status: AC | PRN
  Administered 2018-02-15: 10 mL/h via INTRAVENOUS

## 2018-02-15 MED ORDER — TICAGRELOR 90 MG PO TABS
ORAL_TABLET | ORAL | Status: DC | PRN
  Administered 2018-02-15: 180 mg via ORAL

## 2018-02-15 MED ORDER — HEPARIN SODIUM (PORCINE) 1000 UNIT/ML IJ SOLN
INTRAMUSCULAR | Status: DC | PRN
  Administered 2018-02-15: 2000 [IU] via INTRAVENOUS
  Administered 2018-02-15: 7000 [IU] via INTRAVENOUS
  Administered 2018-02-15: 5000 [IU] via INTRAVENOUS

## 2018-02-15 MED ORDER — NITROGLYCERIN 1 MG/10 ML FOR IR/CATH LAB
INTRA_ARTERIAL | Status: AC
  Filled 2018-02-15: qty 10

## 2018-02-15 MED ORDER — FENTANYL CITRATE (PF) 100 MCG/2ML IJ SOLN
INTRAMUSCULAR | Status: DC | PRN
  Administered 2018-02-15 (×4): 25 ug via INTRAVENOUS

## 2018-02-15 MED ORDER — HEART ATTACK BOUNCING BOOK
Freq: Once | Status: AC
  Administered 2018-02-15: 21:00:00
  Filled 2018-02-15: qty 1

## 2018-02-15 MED ORDER — SODIUM CHLORIDE 0.9% FLUSH: 3.0000 mL | INTRAVENOUS | Status: DC | PRN

## 2018-02-15 MED ORDER — TICAGRELOR 90 MG PO TABS
90.0000 mg | ORAL_TABLET | Freq: Two times a day (BID) | ORAL | Status: DC
  Administered 2018-02-15 – 2018-02-16 (×2): 90 mg via ORAL
  Filled 2018-02-15 (×2): qty 1

## 2018-02-15 SURGICAL SUPPLY — 17 items
BALLN SAPPHIRE 2.5X12 (BALLOONS) ×2
BALLN SAPPHIRE ~~LOC~~ 3.25X10 (BALLOONS) ×2 IMPLANT
BALLOON SAPPHIRE 2.5X12 (BALLOONS) ×1 IMPLANT
CATH 5FR JL3.5 JR4 ANG PIG MP (CATHETERS) ×2 IMPLANT
CATH LAUNCHER 6FR EBU3.5 (CATHETERS) ×2 IMPLANT
DEVICE RAD COMP TR BAND LRG (VASCULAR PRODUCTS) ×2 IMPLANT
GLIDESHEATH SLEND SS 6F .021 (SHEATH) ×2 IMPLANT
GUIDEWIRE INQWIRE 1.5J.035X260 (WIRE) ×1 IMPLANT
INQWIRE 1.5J .035X260CM (WIRE) ×2
KIT ENCORE 26 ADVANTAGE (KITS) ×2 IMPLANT
KIT HEART LEFT (KITS) ×2 IMPLANT
PACK CARDIAC CATHETERIZATION (CUSTOM PROCEDURE TRAY) ×2 IMPLANT
STENT SYNERGY DES 3X16 (Permanent Stent) ×2 IMPLANT
SYR MEDRAD MARK 7 150ML (SYRINGE) ×2 IMPLANT
TRANSDUCER W/STOPCOCK (MISCELLANEOUS) ×2 IMPLANT
TUBING CIL FLEX 10 FLL-RA (TUBING) ×2 IMPLANT
WIRE COUGAR XT STRL 190CM (WIRE) ×2 IMPLANT

## 2018-02-15 NOTE — Brief Op Note (Signed)
BRIEF CARDIAC CATHETERIZATION NOTE  DATE: 02/15/2018 TIME: 9:53 AM  PATIENT:  Patrick Crane  67 y.o. male  PRE-OPERATIVE DIAGNOSIS:  NSTEMI  POST-OPERATIVE DIAGNOSIS:  *NSTEMI  PROCEDURE:  Procedure(s): LEFT HEART CATH AND CORONARY ANGIOGRAPHY (N/A) CORONARY STENT INTERVENTION (N/A)  SURGEON:  Surgeon(s) and Role:    * Lucyann Romano, Cristal Deerhristopher, MD - Primary  FINDINGS: 1. 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 2. Moderately to severely reduced LVEF with mid anterior severe hypokinesis. 3. Mildly elevated LVEDP (~20 mmHg). 4. Successful PCI to OM2 using Synergy 3.0 x 16 mm DES with 0% residual stenosis and TIMI-3 flow.  RECOMMENDATIONS: 1. DAPT with ASA and ticagrelor for at least 12 months. 2. Aggressive secondary prevention. 3. Favor medical therapy of complex LAD disease for now.  Patient will need close outpatient follow-up at [facility] and consideration of PCI/atherectomy to LAD/D1 if he has recurrent symptoms or LVEF does not improve. 4. Optimize evidence-based HF therapy.  Yvonne Kendallhristopher Macaela Presas, MD [facility] Pager: [phone]

## 2018-02-15 NOTE — Progress Notes (Addendum)
Progress Note  Patient Name: Patrick EmmsFrank Sturkey Date of Encounter: 02/15/2018  Primary Cardiologist: [facility]. [place]  Subjective   No significant overnight events. Patient denies any chest pain or shortness of breath. He has no complaints at this time.   Patient is scheduled for left heart catheterization this morning. Answered patient and wife's questions about procedure.   Inpatient Medications    Scheduled Meds: . aspirin EC  81 mg Oral Daily  . atorvastatin  80 mg Oral q1800  . insulin aspart  0-15 Units Subcutaneous TID WC  . insulin aspart  0-5 Units Subcutaneous QHS  . losartan  25 mg Oral Daily  . metoprolol tartrate  25 mg Oral BID  . nitroGLYCERIN  0.5 inch Topical Q6H  . sodium chloride flush  3 mL Intravenous Q12H   Continuous Infusions: . sodium chloride    . sodium chloride 1 mL/kg/hr (02/15/18 0518)  . heparin 1,400 Units/hr (02/15/18 0600)   PRN Meds: sodium chloride, acetaminophen, ALPRAZolam, nitroGLYCERIN, ondansetron (ZOFRAN) IV, sodium chloride flush, zolpidem   Vital Signs    Vitals:   02/14/18 1924 02/14/18 2347 02/15/18 0400 02/15/18 0410  BP: 136/83 129/77 129/75 129/75  Pulse: 71 74 68 68  Resp: 17 12 10 19   Temp: 98.2 F (36.8 C) 98 F (36.7 C) 98.4 F (36.9 C) 97.9 F (36.6 C)  TempSrc: Oral Oral Oral Oral  SpO2: 93% 99% 94%   Weight:    121.2 kg  Height:        Intake/Output Summary (Last 24 hours) at 02/15/2018 0741 Last data filed at 02/15/2018 0600 Gross per 24 hour  Intake 1261.75 ml  Output -  Net 1261.75 ml   Filed Weights   02/13/18 0547 02/14/18 0412 02/15/18 0410  Weight: 122.5 kg 122.5 kg 121.2 kg    Telemetry    Sinus rhythm with frequent PVCs at times in trigeminy pattern. - Personally Reviewed  ECG    Normal sinus rhythm, rate 75 bpm, with PVC and T wave inversions in leads I and aVL. - Personally Reviewed  Physical Exam   GEN: Caucasian male sitting comfortably in hospital bed. Alert  and in no acute distress.   Neck: Supple. Cardiac: RRR. No murmurs, rubs, or gallops.  Respiratory: Clear to auscultation bilaterally. GI: Abdomen soft, non-distended, and non-tender to palpation.  MS: No lower extremity edema. No deformity. Neuro:  No focal deficits.  Psych: Normal affect. Pleasant and cooperative.  Labs    Chemistry Recent Labs  Lab 02/13/18 0505 02/14/18 0428  NA 139 140  K 3.9 3.8  CL 104 104  CO2 26 27  GLUCOSE 144* 114*  BUN 20 14  CREATININE 1.00 1.07  CALCIUM 9.8 9.3  PROT 7.7 6.4*  ALBUMIN 4.5 3.7  AST 24 37  ALT 17 19  ALKPHOS 38 30*  BILITOT 0.7 0.8  GFRNONAA >60 >60  GFRAA >60 >60  ANIONGAP 9 9     Hematology Recent Labs  Lab 02/13/18 0505 02/14/18 0428 02/15/18 0220  WBC 5.9 7.0 6.7  RBC 4.81 4.10* 4.21*  HGB 14.3 12.8* 12.8*  HCT 44.5 37.6* 39.0  MCV 92.5 91.7 92.6  MCH 29.7 31.2 30.4  MCHC 32.1 34.0 32.8  RDW 11.9 12.0 12.1  PLT 173 154 157    Cardiac Enzymes Recent Labs  Lab 02/13/18 0505 02/13/18 1808 02/13/18 2331 02/14/18 0428  TROPONINI 0.57* 7.54* 6.57* 5.45*   No results for input(s): TROPIPOC in the last 168  hours.   BNP Recent Labs  Lab 02/13/18 0505  BNP 98.7     DDimer No results for input(s): DDIMER in the last 168 hours.   Radiology    No results found.  Cardiac Studies   None.  Patient Profile     67 y.o. male with a history of hypertension, hyperlipidemia, diabetes mellitus, remote tobacco use, and family history of CAD but no prior cardiac history. Patient his visiting his children and is from [place] He presented to the ED on 02/13/2018 after being awakened with chest pain that radiated to his left arm. Pain persisted for 6 hours so [facility] evaluation where he was found to have an NSTEMI.  Assessment & Plan    NSTEMI - Troponin peaked at 6.57.  - EKG this morning showed normal sinus rhythm with T wave inversions in leads I and  aVL. - Patient denies any angina since receiving Nitroglycerin in the ED.  - Continue IV Heparin drip. - Continue aspirin, high-intensity statin, and beta-blocker. - Patient is scheduled for left heart catheterization later today.   Hypertension - Most recent BP 129/75.  - Continue Losartan 25mg  daily. - Continue Lopressor 25mg  twice daily.  Hyperlipidemia - Patient on Zocor 20mg  at home. Intolerant to Lipitor in the past due to myalgias.  - Lipid panel this admission: Cholesterol 151, Triglycerides 103, HDL 52, LDL 78. - [facility] However, may need to transition to Crestor (given previous intolerance to Lipitor) depending on what heart catheterization shows.  Diabetes Mellitus - Patient is on Metformin at home. This has been held for catheterization. - Continue sliding scale insulin.   For questions or updates, please contact [facility] Please consult [email or web address] for contact info under        Signed, Corrin ParkerCallie E Goodrich, PA-C  02/15/2018, 7:41 AM     Patient seen and examined. Agree with assessment and plan. Pt back from cath. Data and cines reviewed. Significant 2 vesseal CAD involving 95% OM2 vessel and heavily calcified 70% mid LAD involving D1; s/p PCI with DES stent to OM2.  Feels better.  R radial site stable.  Telemetry with sinus rhythm in 80s with PVC's.    Lennette Biharihomas A. , MD, [facility] 02/15/2018 5:25 PM

## 2018-02-15 NOTE — Progress Notes (Signed)
TR BAND REMOVAL  LOCATION:    right radial  DEFLATED PER PROTOCOL:    Yes.    TIME BAND OFF / DRESSING APPLIED:    1345   SITE UPON ARRIVAL:    Level 0  SITE AFTER BAND REMOVAL:    Level 1( BRUISE, SOFT)  CIRCULATION SENSATION AND MOVEMENT:    Within Normal Limits   Yes.    COMMENTS:   TOLERATED PROCEDURE WELL

## 2018-02-15 NOTE — Interval H&P Note (Signed)
History and Physical Interval Note:  02/15/2018 8:39 AM  Patrick Crane  has presented today for cardiac catheterization, with the diagnosis of NSTEMI  The various methods of treatment have been discussed with the patient and family. After consideration of risks, benefits and other options for treatment, the patient has consented to  Procedure(s): LEFT HEART CATH AND CORONARY ANGIOGRAPHY (N/A) as a surgical intervention .  The patient's history has been reviewed, patient examined, no change in status, stable for surgery.  I have reviewed the patient's chart and labs.  Questions were answered to the patient's satisfaction.    Cath Lab Visit (complete for each Cath Lab visit)  Clinical Evaluation Leading to the Procedure:   ACS: Yes.    Non-ACS:  N/A  Aundraya Dripps

## 2018-02-16 ENCOUNTER — Inpatient Hospital Stay (HOSPITAL_COMMUNITY): Payer: Medicare Other

## 2018-02-16 ENCOUNTER — Other Ambulatory Visit (HOSPITAL_COMMUNITY): Payer: Medicare Other

## 2018-02-16 DIAGNOSIS — I214 Non-ST elevation (NSTEMI) myocardial infarction: Secondary | ICD-10-CM

## 2018-02-16 DIAGNOSIS — I2510 Atherosclerotic heart disease of native coronary artery without angina pectoris: Secondary | ICD-10-CM

## 2018-02-16 DIAGNOSIS — I255 Ischemic cardiomyopathy: Secondary | ICD-10-CM

## 2018-02-16 LAB — BASIC METABOLIC PANEL
ANION GAP: 9 (ref 5–15)
BUN: 16 mg/dL (ref 8–23)
CO2: 26 mmol/L (ref 22–32)
Calcium: 9.3 mg/dL (ref 8.9–10.3)
Chloride: 106 mmol/L (ref 98–111)
Creatinine, Ser: 1.09 mg/dL (ref 0.61–1.24)
GFR calc non Af Amer: >60 mL/min (ref >60)
Glucose, Bld: 112 mg/dL — ABNORMAL HIGH (ref 70–99)
Potassium: 4 mmol/L (ref 3.5–5.1)
SODIUM: 141 mmol/L (ref 135–145)

## 2018-02-16 LAB — CBC
HCT: 38.8 % — ABNORMAL LOW (ref 39.0–52.0)
Hemoglobin: 12.8 g/dL — ABNORMAL LOW (ref 13.0–17.0)
MCH: 30.4 pg (ref 26.0–34.0)
MCHC: 33 g/dL (ref 30.0–36.0)
MCV: 92.2 fL (ref 80.0–100.0)
NRBC: 0 % (ref 0.0–0.2)
Platelets: 155 10*3/uL (ref 150–400)
RBC: 4.21 MIL/uL — ABNORMAL LOW (ref 4.22–5.81)
RDW: 12.1 % (ref 11.5–15.5)
WBC: 6.4 10*3/uL (ref 4.0–10.5)

## 2018-02-16 LAB — ECHOCARDIOGRAM COMPLETE
Height: 76 in
Weight: 4232.83 oz

## 2018-02-16 MED ORDER — NITROGLYCERIN 0.4 MG SL SUBL: 0.4000 mg | SUBLINGUAL_TABLET | SUBLINGUAL | 3 refills | Status: AC | PRN

## 2018-02-16 MED ORDER — ROSUVASTATIN CALCIUM 20 MG PO TABS: 20.0000 mg | ORAL_TABLET | Freq: Every day | ORAL | 3 refills | Status: DC

## 2018-02-16 MED ORDER — ROSUVASTATIN CALCIUM 40 MG PO TABS: 20.0000 mg | ORAL_TABLET | Freq: Every day | ORAL | 2 refills | Status: AC

## 2018-02-16 MED ORDER — TICAGRELOR 90 MG PO TABS: 90.0000 mg | ORAL_TABLET | Freq: Two times a day (BID) | ORAL | 3 refills | Status: AC

## 2018-02-16 MED ORDER — ASPIRIN 81 MG PO TBEC: 81.0000 mg | DELAYED_RELEASE_TABLET | Freq: Every day | ORAL | 1 refills | Status: AC

## 2018-02-16 MED ORDER — CARVEDILOL 6.25 MG PO TABS: 6.2500 mg | ORAL_TABLET | Freq: Two times a day (BID) | ORAL | 3 refills | Status: AC

## 2018-02-16 MED ORDER — LOSARTAN POTASSIUM 25 MG PO TABS: 25.0000 mg | ORAL_TABLET | Freq: Two times a day (BID) | ORAL | 2 refills | Status: AC

## 2018-02-16 MED ORDER — LOSARTAN POTASSIUM 25 MG PO TABS: 25.0000 mg | ORAL_TABLET | Freq: Every day | ORAL | 3 refills | Status: DC

## 2018-02-16 MED FILL — CARVEDILOL 6.25 MG TABLET: 6.25 | 30 days supply | Qty: 60 | Fill #0

## 2018-02-16 MED FILL — ASPIRIN LOW DOSE 81 MG TBEC: 81 | 90 days supply | Qty: 90 | Fill #0

## 2018-02-16 MED FILL — LOSARTAN POTASSIUM 25 MG TA: 25 | 30 days supply | Qty: 30 | Fill #0

## 2018-02-16 MED FILL — BRILINTA 90 MG TABLET: 90 | 30 days supply | Qty: 60 | Fill #0

## 2018-02-16 MED FILL — NITROGLYCERIN 0.4 MG TAB SL: 0.4 | 8 days supply | Qty: 25 | Fill #0

## 2018-02-16 MED FILL — ROSUVASTATIN CALCIUM 20 MG: 20 | 30 days supply | Qty: 30 | Fill #0

## 2018-02-16 NOTE — Progress Notes (Signed)
CARDIAC REHAB PHASE I   PRE:  Rate/Rhythm: 72 SR PVCs  BP:  Supine: 131/63  Sitting:   Standing:    SaO2:   MODE:  Ambulation: 600 ft   POST:  Rate/Rhythm: 85 SR PVCs  BP:  Supine:   Sitting: 132/66  Standing:    SaO2: 97%RA 0755-0905 Pt walked 600 ft on RA and tolerated well. No CP but does have PVCs. MI education completed with pt and family who voiced understanding. Stressed importance of brilinta with stent, reviewed NTG use, MI restrictions, risk factors, ex ed and gave heart healthy diet. Discuss[place] CRP [place] and [facility]. Pt knows also to discuss with new cardiologist in [place].   Patrick Nuttingharlene Umeka Wrench, RN BSN  02/16/2018 8:57 AM

## 2018-02-16 NOTE — Progress Notes (Addendum)
Progress Note  Patient Name: Patrick Crane Date of Encounter: 02/16/2018  Primary Cardiologist: New to [facility]. Lives in [place], [place].  Subjective   No significant overnight events. No chest pain. Episodes of dyspnea after taking Brilinta. Patient is eager to go home.  Inpatient Medications    Scheduled Meds: . aspirin EC  81 mg Oral Daily  . carvedilol  6.25 mg Oral BID WC  . enoxaparin (LOVENOX) injection  40 mg Subcutaneous Q24H  . insulin aspart  0-15 Units Subcutaneous TID WC  . insulin aspart  0-5 Units Subcutaneous QHS  . losartan  25 mg Oral Daily  . rosuvastatin  20 mg Oral q1800  . sodium chloride flush  3 mL Intravenous Q12H  . ticagrelor  90 mg Oral BID   Continuous Infusions: . sodium chloride     PRN Meds: sodium chloride, acetaminophen, ALPRAZolam, nitroGLYCERIN, ondansetron (ZOFRAN) IV, sodium chloride flush, zolpidem   Vital Signs    Vitals:   02/15/18 1019 02/15/18 1653 02/15/18 2001 02/16/18 0622  BP: (!) 151/78 (!) 154/80 124/72 128/64  Pulse: 68 68 71 62  Resp: 14 14 20 16   Temp: 98.3 F (36.8 C) 98.2 F (36.8 C) 98.3 F (36.8 C) 97.7 F (36.5 C)  TempSrc: Oral Oral Oral Oral  SpO2: 94% 96% 93% 97%  Weight:    120 kg  Height:        Intake/Output Summary (Last 24 hours) at 02/16/2018 0811 Last data filed at 02/16/2018 0547 Gross per 24 hour  Intake 1062 ml  Output 2455 ml  Net -1393 ml   Filed Weights   02/14/18 0412 02/15/18 0410 02/16/18 0622  Weight: 122.5 kg 121.2 kg 120 kg    Telemetry    Sinus rhythm with heart rates in the 60's to 80's with frequent PVCs. - Personally Reviewed  ECG    Normal sinus rhythm, rate 61 bpm, with T wave inversions in leads I and aVL and elevated J point in lead V1. - Personally Reviewed  Physical Exam   GEN: Caucasian male sitting comfortably in hospital bed. Alert and in no acute distress.   Neck: Supple. Cardiac: RRR. No murmurs, rubs, or gallops. Right radial cath site soft with no  induration or bruising.  Respiratory: Clear to auscultation bilaterally. No wheezes, rhonchi, or rales.  GI: Abdomen soft, non-distended, and non-tender to palpation.  MS: No lower extremity edema. SCDs in place. No deformity. Neuro:  No focal deficits.  Psych: Normal affect. Pleasant and cooperative.  Labs    Chemistry Recent Labs  Lab 02/13/18 0505 02/14/18 0428 02/16/18 0126  NA 139 140 141  K 3.9 3.8 4.0  CL 104 104 106  CO2 26 27 26   GLUCOSE 144* 114* 112*  BUN 20 14 16   CREATININE 1.00 1.07 1.09  CALCIUM 9.8 9.3 9.3  PROT 7.7 6.4*  --   ALBUMIN 4.5 3.7  --   AST 24 37  --   ALT 17 19  --   ALKPHOS 38 30*  --   BILITOT 0.7 0.8  --   GFRNONAA >60 >60 >60  GFRAA >60 >60 >60  ANIONGAP 9 9 9      Hematology Recent Labs  Lab 02/14/18 0428 02/15/18 0220 02/16/18 0126  WBC 7.0 6.7 6.4  RBC 4.10* 4.21* 4.21*  HGB 12.8* 12.8* 12.8*  HCT 37.6* 39.0 38.8*  MCV 91.7 92.6 92.2  MCH 31.2 30.4 30.4  MCHC 34.0 32.8 33.0  RDW 12.0 12.1 12.1  PLT 154  157 155    Cardiac Enzymes Recent Labs  Lab 02/13/18 0505 02/13/18 1808 02/13/18 2331 02/14/18 0428  TROPONINI 0.57* 7.54* 6.57* 5.45*   No results for input(s): TROPIPOC in the last 168 hours.   BNP Recent Labs  Lab 02/13/18 0505  BNP 98.7     DDimer No results for input(s): DDIMER in the last 168 hours.   Radiology    No results found.  Cardiac Studies   Left Heart Catheterization 02/15/2018: Conclusions: 1. Significant two-vessel coronary artery disease with complex 70% mid LAD stenosis involving D1 and heavy calcification, as well as 95% OM2 stenosis (culprit lesion). 2. Moderately to severely reduced LVEF with mid anterior severe hypokinesis. 3. Mildly elevated left ventricular filling pressure. 4. Successful PCI to OM2 using Synergy 3.0 x 16 mm drug-eluting stent with 0% residual stenosis and TIMI-3 flow.  Recommendations: 1. Dual antiplatelet therapy with aspirin and ticagrelor for at least 12  months. 2. Aggressive secondary prevention. 3. Favor medical therapy of complex LAD disease for now. Patient will need close outpatient follow-up at home [facility] and consideration of PCI/atherectomy to LAD/D1 if he has recurrent symptoms or LVEF does not improve with revascularization of OM2 and heart failure therapy. 4. Optimize evidence-based heart failure therapy; if renal function allows, gentle diuresis may need to be initiated later today or tomorrow.  Patient Profile     67 y.o. male with a history of hypertension, hyperlipidemia, diabetes mellitus, remote tobacco use, and family history of CAD but no prior cardiac history. Patient his visiting his children and is from [place]. He presented to the ED on 02/13/2018 after being awakened with chest pain that radiated to his left arm. Pain persisted for 6 hours so patient presented to [facility] ED for further evaluation where he was found to have an NSTEMI.  Assessment & Plan    NSTEMI - Troponin peaked at 7.54.  - EKG this morning showed normal sinus rhythm with T wave inversions in leads I and aVL and elevated J point in lead V1. - Left heart catheterization showed significiant 2-vessel CAD with complex 70% mid LAD stenosis involving D1 and heavy calcification, as well as 95% OM2 stenosis (culprit lesion). Patient underwent successful PCI with DES to the OM2 lesion.  - Echo pending. - Continue dual antiplatelet therapy with Aspirin 81mg  daily and Brilinta 90mg  twice daily.  - Continue high-intensity statin and beta-blocker. - Patient will need close outpatient follow-up in [place] where he lives. - Patient likely can be discharged after Echo results are back. Will defer to MD.   Ischemic Cardiomyopathy - Left heart catheterization showed moderately to severely reduced LVEF with mid anterior severe hypokinesis. - Echo pending. - Patient appears euvolemic on exam this morning. - Continue ARB and  beta-blocker.  - I don't think patient needs any diuresis. Depending on what the EF on the Echo shows, could consider prescribing PRN Lasix at time of discharge.    Hypertension - Most recent BP 128/64.  - Continue Losartan 25mg  daily. - Continue Lopressor 25mg  twice daily.  Hyperlipidemia - Patient on Zocor 20mg  at home. Intolerant to Lipitor in the past due to myalgias.  - Lipid panel this admission: Cholesterol 151, Triglycerides 103, HDL 52, LDL 78. - Continue Crestor 20mg  daily.   Diabetes Mellitus - Patient is on Metformin at home. This was held prior catheterization. Can restart 48 hours after catheterization.  - Continue sliding scale insulin.   For questions or updates, please contact  [facility] Please consult [email or web address] for contact info under        Signed, Corrin ParkerCallie E Goodrich, PA-C  02/16/2018, 8:11 AM    Patient seen and examined. Agree with assessment and plan. Feels well post PCI to LCX OM2 vessel following NSTEMI. Significant LAD calcification extending into diagonal. LDL 78; increase crestor to 40 mg. Echo just completed; await results. Will titrate losartan to 25 mg bid.  Pt has an appointment with cardi[place] in [place] next week. DC today.   Lennette Biharihomas A. Kelly, MD, [facility] 02/16/2018 11:41 AM

## 2018-02-16 NOTE — Discharge Instructions (Signed)
Additional Recommendations: - You can restart your Metformin tomorrow (02/17/2018), 48 hours after your cardiac catheterization. - You may want to ask your primary care physician or primary Cardiologist about repeating a lipid panel and CMP in about 6 weeks since we increased the statin you are one.  Post NSTEMI: NO HEAVY LIFTING X 2 WEEKS. NO SEXUAL ACTIVITY X 2 WEEKS. NO DRIVING X 1 WEEK. NO SOAKING BATHS, HOT TUBS, POOLS, ETC., X 7 DAYS.  Radial Site Care: Refer to this sheet in the next few weeks. These instructions provide you with information on caring for yourself after your procedure. Your caregiver may also give you more specific instructions. Your treatment has been planned according to current medical practices, but problems sometimes occur. Call your caregiver if you have any problems or questions after your procedure. HOME CARE INSTRUCTIONS  You may shower the day after the procedure.Remove the bandage (dressing) and gently wash the site with plain soap and water.Gently pat the site dry.   Do not apply powder or lotion to the site.   Do not submerge the affected site in water for 3 to 5 days.   Inspect the site at least twice daily.   Do not flex or bend the affected arm for 24 hours.   No lifting over 5 pounds (2.3 kg) for 5 days after your procedure.   Do not drive home if you are discharged the same day of the procedure. Have someone else drive you.  What to expect:  Any bruising will usually fade within 1 to 2 weeks.   Blood that collects in the tissue (hematoma) may be painful to the touch. It should usually decrease in size and tenderness within 1 to 2 weeks.  SEEK IMMEDIATE MEDICAL CARE IF:  You have unusual pain at the radial site.   You have redness, warmth, swelling, or pain at the radial site.   You have drainage (other than a small amount of blood on the dressing).   You have chills.   You have a fever or persistent symptoms for more than 72 hours.     You have a fever and your symptoms suddenly get worse.   Your arm becomes pale, cool, tingly, or numb.   You have heavy bleeding from the site. Hold pressure on the site.   **PLEASE REMEMBER TO BRING ALL OF YOUR MEDICATIONS TO EACH OF YOUR FOLLOW-UP OFFICE VISITS.  Cardiac Catheterization Results for Primary Cardiologist: Conclusions: 1. Significant two-vessel coronary artery disease with complex 70% mid LAD stenosis involving D1 and heavy calcification, as well as 95% OM2 stenosis (culprit lesion). 2. Moderately to severely reduced LVEF with mid anterior severe hypokinesis. 3. Mildly elevated left ventricular filling pressure. 4. Successful PCI to OM2 using Synergy 3.0 x 16 mm drug-eluting stent with 0% residual stenosis and TIMI-3 flow.  Recommendations: 1. Dual antiplatelet therapy with aspirin and ticagrelor for at least 12 months. 2. Aggressive secondary prevention. 3. Favor medical therapy of complex LAD disease for now. Patient will need close outpatient follow-up at home [facility] and consideration of PCI/atherectomy to LAD/D1 if he has recurrent symptoms or LVEF does not improve with revascularization of OM2 and heart failure therapy. 4. Optimize evidence-based heart failure therapy; if renal function allows, gentle diuresis may need to be initiated later today or tomorrow.  Echocardiogram Results for Primary Cardiologist: Study Conclusions: - Left ventricle: The cavity size was normal. There was mild   concentric hypertrophy. Systolic function was mildly to   moderately reduced.  The estimated ejection fraction was in the   range of 40% to 45%. Moderate hypokinesis of the   basal-midanterolateral and inferolateral myocardium; consistent   with ischemia or infarction in the distribution of the left   circumflex coronary artery. - Aortic valve: There was trivial regurgitation.

## 2018-02-16 NOTE — Care Management (Signed)
#  4.   S/W  KYLE  @ [employer] # [phone]  TICAGRELOR: NONE FORMULARY   BRILINTA  90 MG BID COVER- YES CO-PAY- $ 393.31 TIER- 3 DRUG PRIOR APPROVAL- NO  DEDUCTIBLE : NOT MET PRESCRIPTION  NEED TO BE FILL ON 02-17-2018 TO HELP WITH DEDUCTIBLE FOR 2020  PREFERRED  PHARMACY : YES [facility] AND  [facility]

## 2018-02-16 NOTE — Care Management (Signed)
1127 02-16-18 Tomi BambergerBrenda Graves-Bigelow, RN,BSN [phone] Transitions of Care Pharmacy delivered medications to patients room. No further needs from CM at this time.

## 2018-02-16 NOTE — Discharge Summary (Signed)
Discharge Summary    Patient ID: Patrick Crane MRN: [id number]; DOB: 1950-08-01  Admit date: 02/13/2018 Discharge date: 02/16/2018  Primary Care Provider: PCP not listed in System. Primary Cardiologist: New to [facility]. Patient lives in [place], [place]. Primary Electrophysiologist:  None   Discharge Diagnoses    Principal Problem:   NSTEMI (non-ST elevated myocardial infarction) [facility]) Active Problems:   Ischemic cardiomyopathy   CAD (coronary artery disease)   Hypertension   Hyperlipidemia   Diabetes mellitus without complication (HCC)   GERD (gastroesophageal reflux disease)   Allergies Allergies  Allergen Reactions  . Lipitor [Atorvastatin Calcium] Other (See Comments)    myalgia    Diagnostic Studies/Procedures    Left Heart Catheterization 02/15/2018: Conclusions: 1. Significant two-vessel coronary artery disease with complex 70% mid LAD stenosis involving D1 and heavy calcification, as well as 95% OM2 stenosis (culprit lesion). 2. Moderately to severely reduced LVEF with mid anterior severe hypokinesis. 3. Mildly elevated left ventricular filling pressure. 4. Successful PCI to OM2 using Synergy 3.0 x 16 mm drug-eluting stent with 0% residual stenosis and TIMI-3 flow.  Recommendations: 1. Dual antiplatelet therapy with aspirin and ticagrelor for at least 12 months. 2. Aggressive secondary prevention. 3. Favor medical therapy of complex LAD disease for now. Patient will need close outpatient follow-up at home [facility]) and consideration of PCI/atherectomy to LAD/D1 if he has recurrent symptoms or LVEF does not improve with revascularization of OM2 and heart failure therapy. 4. Optimize evidence-based heart failure therapy; if renal function allows, gentle diuresis may need to be initiated later today or tomorrow. _____________  Echocardiogram 02/16/2018: Study Conclusions: - Left ventricle: The cavity size was normal. There was mild   concentric hypertrophy.  Systolic function was mildly to   moderately reduced. The estimated ejection fraction was in the   range of 40% to 45%. Moderate hypokinesis of the   basal-midanterolateral and inferolateral myocardium; consistent   with ischemia or infarction in the distribution of the left   circumflex coronary artery. - Aortic valve: There was trivial regurgitation.   History of Present Illness     Patrick Crane is a 67 year old Caucasian male with a history of hypertension, hyperlipidemia, diabetes mellitus, remote tobacco use, and family history of CAD but no prior cardiac history. He is from [place], [place], but is in town visiting family for the holidays. Patient was awakened from sleep with chest pain around midnight on 02/13/2018. Patient describes the pain as an "aching" pain and ranks it as a 6/10 on the pain scale at its worse. He tried Rolaids with no help relief. Patient went downstairs. Pain did not worsen with exertion and was not associated with position. Patient denies any associated diaphoresis, shortness of breath, or nausea/vomiting with the pain.   When the symptoms did not resolve, patient's wife drove him to the [facility] ED. EKG showed sinus rhythm with ST depression in anterior and lateral leads. Troponin elevated at 0.57. Patient  received Aspirin 81mg  x4, Nitroglycerin x3, and then Nitropaste. He also received Fentanyl IV earlier but patient feels like the Nitroglycerin helped more. Patient had no further episodes of chest pain after receiving the Nitroglycerin. He was started on a Heparin drip and transferred to [facility] for further evaluation and management of NSTEMI.  Of note, patient had similar chest pain that woke him from sleep about 1 week prior to presentation that eventually went away. Patient states he has been busy during the holiday and has not felt limited  by chest pain or shortness of breath. He denies any recent exertional symptoms. He was chopping wood about 1  week prior to presentation and did not experience any symptoms then. His initial episode of chest occurred a couple of days later.   Hospital Course     Consultants: None.  NSTEMI Patrick Crane was admitted on 02/13/2018 for NSTEMI as stated above. Troponin peaked at 7.54. Left heart catheterization showed significiant 2-vessel CAD with complex 70% mid LAD stenosis involving D1 and heavy calcification, as well as 95% OM2 stenosis (culprit lesion). Patient underwent successful PCI with DES to the OM2 lesion. Echocardiogram showed LVEF of 40-45% with moderate hypokinesis of the basal-midanterolateral and inferolateral myocardium. Patient tolerated the procedure well and was able to ambulate today without any problems.  - Continue dual antiplatelet therapy with Aspirin 81mg  daily and Brilinta 90mg  twice daily.  - Continue high-intensity statin and beta-blocker. - Patient will need close outpatient follow-up in [place] where he lives. He may need PCI/atherecotmy to LAD/D1 if he has recurrent symptoms or LVEF does not improve with revascularization of OM2 and heart failure therapy.   Ischemic Cardiomyopathy - Echo as above. - Patient appears euvolemic on exam today. No diuretics needed at this time.  - Continue ARB and beta-blocker.  Hypertension - Patient has been hypertensive during admission with systolic BP as high as the 170's at one time. Patient started on Lopressor on admission and Losartan was added later with improvement of BP. Most recent BP 113/64. - Will increase Losartan to 25mg  twice daily at discharge. - Continue Lopressor 25mg  twice daily. - Patient has an automatic BP machine at home and will check his BP if he is symptomatic.   Hyperlipidemia - Patient on Zocor 20mg  at home. Intolerant to Lipitor in the past due to myalgias.  - Lipid panel this admission: Cholesterol 151, Triglycerides 103, HDL 52, LDL 78. - Crestor 20mg  started after catheterization. Will increase to  40mg  daily at time of discharge.  - Recommend repeat lipid panel and CMP in 6 weeks after transitioning to high-intensity statin.  Diabetes Mellitus - Patient is on Metformin at home. This was held prior catheterization. Can restart tomorrow (02/17/2018), which will be 48 hours after catheterization.   Patient seen and examined by Dr. Tresa EndoKelly and was determined to be stable for discharge. Patient will follow-up with outpatient Cardiology in [place] where he lives. Medications as below.   Discharge Vitals Blood pressure 113/64, pulse 63, temperature 98 F (36.7 C), temperature source Oral, resp. rate 16, height 6\' 4"  (1.93 m), weight 120 kg, SpO2 95 %.  Filed Weights   02/14/18 0412 02/15/18 0410 02/16/18 0622  Weight: 122.5 kg 121.2 kg 120 kg    Labs & Radiologic Studies    CBC Recent Labs    02/15/18 0220 02/16/18 0126  WBC 6.7 6.4  HGB 12.8* 12.8*  HCT 39.0 38.8*  MCV 92.6 92.2  PLT 157 155   Basic Metabolic Panel Recent Labs    16/11/9610/29/19 0428 02/16/18 0126  NA 140 141  K 3.8 4.0  CL 104 106  CO2 27 26  GLUCOSE 114* 112*  BUN 14 16  CREATININE 1.07 1.09  CALCIUM 9.3 9.3   Liver Function Tests Recent Labs    02/14/18 0428  AST 37  ALT 19  ALKPHOS 30*  BILITOT 0.8  PROT 6.4*  ALBUMIN 3.7   No results for input(s): LIPASE, AMYLASE in the last 72 hours. Cardiac Enzymes Recent Labs  02/13/18 1808 02/13/18 2331 02/14/18 0428  TROPONINI 7.54* 6.57* 5.45*   BNP Invalid input(s): POCBNP D-Dimer No results for input(s): DDIMER in the last 72 hours. Hemoglobin A1C Recent Labs    02/15/18 0220  HGBA1C 5.3   Fasting Lipid Panel Recent Labs    02/15/18 0220  CHOL 151  HDL 52  LDLCALC 78  TRIG 103  CHOLHDL 2.9   Thyroid Function Tests Recent Labs    02/13/18 1808  TSH 3.837   _____________  Dg Chest 2 View  Result Date: 02/13/2018 CLINICAL DATA:  Acute onset of generalized chest pain. EXAM: CHEST - 2 VIEW COMPARISON:  None.  FINDINGS: The lungs are well-aerated. Vascular congestion is noted. Increased interstitial markings raise concern for mild interstitial edema. There is no evidence of pleural effusion or pneumothorax. The heart is normal in size; the mediastinal contour is within normal limits. No acute osseous abnormalities are seen. IMPRESSION: Vascular congestion. Increased interstitial markings raise concern for mild interstitial edema. Electronically Signed   By: Roanna RaiderJeffery  Chang M.D.   On: 02/13/2018 05:27   Disposition   Patient is being discharged home today in good condition.  Follow-up Plans & Appointments    Follow-up Information    Cardiology Follow up.   Why:  Please follow-up up with a Cardiologist where you live in the next 1-2 weeks.          Discharge Instructions    AMB Referral to Cardiac Rehabilitation - Phase II   Complete by:  As directed    Diagnosis:   Coronary Stents NSTEMI     Amb Referral to Cardiac Rehabilitation   Complete by:  As directed    [facility] in [place]   Diagnosis:   Coronary Stents NSTEMI        Discharge Medications   Allergies as of 02/16/2018      Reactions   Lipitor [atorvastatin Calcium] Other (See Comments)   myalgia      Medication List    STOP taking these medications   simvastatin 20 MG tablet Commonly known as:  ZOCOR     TAKE these medications   aspirin 81 MG EC tablet Take 1 tablet (81 mg total) by mouth daily. What changed:  when to take this   carvedilol 6.25 MG tablet Commonly known as:  COREG Take 1 tablet (6.25 mg total) by mouth 2 (two) times daily with a meal.   Fish Oil 1200 MG Caps Take 1,200 mg by mouth daily.   ibuprofen 200 MG tablet Commonly known as:  ADVIL,MOTRIN Take 400 mg by mouth daily as needed (pain).   latanoprost 0.005 % ophthalmic solution Commonly known as:  XALATAN Place 1 drop into both eyes at bedtime.   losartan 25 MG tablet Commonly known as:   COZAAR Take 1 tablet (25 mg total) by mouth 2 (two) times daily.   Melatonin 10 MG Tabs Take 10 mg by mouth at bedtime.   metFORMIN 500 MG tablet Commonly known as:  GLUCOPHAGE Take 500 mg by mouth 2 (two) times daily with a meal.   nitroGLYCERIN 0.4 MG SL tablet Commonly known as:  NITROSTAT Place 1 tablet (0.4 mg total) under the tongue every 5 (five) minutes x 3 doses as needed for chest pain.   omeprazole 20 MG capsule Commonly known as:  PRILOSEC Take 20 mg by mouth 2 (two) times daily before a meal.   rosuvastatin 40 MG tablet Commonly known as:  CRESTOR Take  0.5 tablets (20 mg total) by mouth daily at 6 PM.   ticagrelor 90 MG Tabs tablet Commonly known as:  BRILINTA Take 1 tablet (90 mg total) by mouth 2 (two) times daily.        Acute coronary syndrome (MI, NSTEMI, STEMI, etc) this admission?: Yes.     AHA/ACC Clinical Performance & Quality Measures: 5. Aspirin prescribed? - Yes 6. ADP Receptor Inhibitor (Plavix/Clopidogrel, Brilinta/Ticagrelor or Effient/Prasugrel) prescribed (includes medically managed patients)? - Yes 7. Beta Blocker prescribed? - Yes 8. High Intensity Statin (Lipitor 40-80mg  or Crestor 20-40mg ) prescribed? - Yes 9. EF assessed during THIS hospitalization? - Yes 10. For EF <40%, was ACEI/ARB prescribed? - Not Applicable (EF >/= 40%) 11. For EF <40%, Aldosterone Antagonist (Spironolactone or Eplerenone) prescribed? - Not Applicable (EF >/= 40%) 12. Cardiac Rehab Phase II ordered (Included Medically managed Patients)? - Yes     Outstanding Labs/Studies   Recommend having repeat lipid panel and CMP in about 6 weeks after transitioning to high-intensity statin.   Duration of Discharge Encounter   Greater than 30 minutes including physician time.  Signed, Corrin Parker, PA-C 02/16/2018, 12:44 PM

## 2018-04-05 LAB — HEPATIC FUNCTION PANEL
ALT: 22 U/L (ref 0–41)
AST: 20 U/L (ref 0–40)
Albumin: 4.3 g/dL (ref 3.5–5.2)
Alk Phosphatase: 45 U/L (ref 40–130)
Bilirubin, Direct: <0.2 mg/dL (ref 0.00–0.30)
Total Bilirubin: 0.2 mg/dL (ref 0.00–1.20)
Total Protein: 6.4 g/dL (ref 6.4–8.3)

## 2018-04-05 LAB — LIPID PANEL
Chol/HDL Ratio: 2.8 (ref 0.0–4.4)
Cholesterol: 102 mg/dL (ref 100–200)
HDL: 37 mg/dL — ABNORMAL LOW (ref 55–72)
LDL Cholesterol: 48 mg/dL (ref 0.0–100.0)
LDL/HDL Ratio: 1.3
Triglycerides: 85 mg/dL (ref 0–149)
VLDL: 17 mg/dL (ref 5.0–40.0)

## 2018-06-02 LAB — CBC WITH AUTO DIFFERENTIAL
Absolute Baso #: 0 10*3/uL (ref 0.0–0.2)
Absolute Eos #: 0.1 10*3/uL (ref 0.0–0.5)
Absolute Lymph #: 1.1 10*3/uL (ref 1.0–3.2)
Absolute Mono #: 0.3 10*3/uL (ref 0.3–1.0)
Basophils %: 0.7 % (ref 0.0–2.0)
Eosinophils %: 1.7 % (ref 0.0–7.0)
Hematocrit: 36.9 % — ABNORMAL LOW (ref 38.0–52.0)
Hemoglobin: 12.1 g/dL (ref 12.0–17.3)
Immature Grans (Abs): 0 10*3/uL
Immature Granulocytes: 0 %
Lymphocytes: 27.3 % (ref 15.0–45.0)
MCH: 30.1 pg (ref 27.0–34.5)
MCHC: 32.8 g/dL (ref 32.0–36.0)
MCV: 91.8 fL (ref 84.0–100.0)
MPV: 11.8 fL (ref 7.2–13.2)
Monocytes: 8.1 % (ref 4.0–12.0)
NRBC Absolute: 0 10*3/uL
NRBC Automated: 0 %
Neutrophils %: 62.2 % (ref 42.0–74.0)
Neutrophils Absolute: 2.5 10*3/uL (ref 1.6–7.3)
Platelets: 155 10*3/uL (ref 140–440)
RBC: 4.02 x10e6/mcL (ref 4.00–5.20)
RDW: 12.4 % (ref 11.0–16.0)
WBC: 4.1 10*3/uL (ref 3.8–10.6)

## 2018-08-09 LAB — COMPREHENSIVE METABOLIC PANEL
ALT: 14 U/L (ref 0–41)
AST: 17 U/L (ref 0–40)
Albumin/Globulin Ratio: 1.8 mmol/L (ref 1.00–2.00)
Albumin: 4.3 g/dL (ref 3.5–5.2)
Alk Phosphatase: 41 U/L (ref 40–130)
Anion Gap: 12 mmol/L (ref 2–17)
BUN: 17 mg/dL (ref 8–23)
CO2: 26 mmol/L (ref 22–29)
Calcium: 9.3 mg/dL (ref 8.8–10.2)
Chloride: 105 mmol/L (ref 98–107)
Creatinine: 0.9 mg/dL (ref 0.7–1.3)
GFR African American: 101 mL/min/{1.73_m2} (ref >=90)
GFR Non-African American: 87 mL/min/{1.73_m2} — ABNORMAL LOW (ref >=90)
Globulin: 2 g/dL (ref 1.9–4.4)
Glucose: 111 mg/dL — ABNORMAL HIGH (ref 70–99)
Osmolaliy Calculated: 287 mosm/kg (ref 270–287)
Potassium: 4.3 mmol/L (ref 3.5–5.3)
Sodium: 143 mmol/L (ref 135–145)
Total Bilirubin: 0.3 mg/dL (ref 0.00–1.20)
Total Protein: 6.7 g/dL (ref 6.4–8.3)

## 2018-08-09 LAB — LIPID PANEL
Chol/HDL Ratio: 2.2 (ref 0.0–4.4)
Cholesterol: 127 mg/dL (ref 100–200)
HDL: 57 mg/dL (ref 55–72)
LDL Cholesterol: 53 mg/dL (ref 0.0–100.0)
LDL/HDL Ratio: 0.9
Triglycerides: 83 mg/dL (ref 0–149)
VLDL: 16.6 mg/dL (ref 5.0–40.0)

## 2018-08-09 LAB — HEMOGLOBIN A1C
Est. Avg. Glucose, WB: 128
Est. Avg. Glucose-calculated: 140
Hemoglobin A1C: 6.1 % — ABNORMAL HIGH (ref 4.0–6.0)

## 2018-11-18 LAB — HEMOGLOBIN A1C
Est. Avg. Glucose, WB: 111
Est. Avg. Glucose-calculated: 118
Hemoglobin A1C: 5.5 % (ref 4.0–6.0)

## 2019-05-11 LAB — CBC WITH AUTO DIFFERENTIAL
Basophils %: 0.9 % (ref 0.0–2.0)
Basophils Absolute: 0 10*3/uL (ref 0.0–0.2)
Eosinophils %: 1.3 % (ref 0.0–7.0)
Eosinophils Absolute: 0.1 10*3/uL (ref 0.0–0.5)
Hematocrit: 40.2 % (ref 38.0–52.0)
Hemoglobin: 13.5 g/dL (ref 13.0–17.3)
Immature Grans (Abs): 0.01 10*3/uL (ref 0.00–0.06)
Immature Granulocytes %: 0.2 % (ref 0.1–0.6)
Lymphocytes Absolute: 1.1 10*3/uL (ref 1.0–3.2)
Lymphocytes: 23.1 % (ref 15.0–45.0)
MCH: 30.9 pg (ref 27.0–34.5)
MCHC: 33.6 g/dL (ref 32.0–36.0)
MCV: 92 fL (ref 84.0–100.0)
MPV: 11.5 fL (ref 7.2–13.2)
Monocytes %: 8.9 % (ref 4.0–12.0)
Monocytes Absolute: 0.4 10*3/uL (ref 0.3–1.0)
NRBC Absolute: 0 10*3/uL (ref 0.000–0.012)
NRBC Automated: 0 % (ref 0.0–0.2)
Neutrophils %: 65.6 % (ref 42.0–74.0)
Neutrophils Absolute: 3 10*3/uL (ref 1.6–7.3)
Platelets: 194 10*3/uL (ref 140–440)
RBC: 4.37 x10e6/mcL (ref 4.00–5.60)
RDW: 12.4 % (ref 11.0–16.0)
WBC: 4.6 10*3/uL (ref 3.8–10.6)

## 2019-05-11 LAB — COMPREHENSIVE METABOLIC PANEL
ALT: 19 U/L (ref 0–41)
AST: 17 U/L (ref 0–40)
Albumin/Globulin Ratio: 2.2 mmol/L — ABNORMAL HIGH (ref 1.00–2.00)
Albumin: 4.8 g/dL (ref 3.5–5.2)
Alk Phosphatase: 46 U/L (ref 40–130)
Anion Gap: 10 mmol/L (ref 2–17)
BUN: 21 mg/dL (ref 8–23)
CO2: 27 mmol/L (ref 22–29)
Calcium: 9.6 mg/dL (ref 8.8–10.2)
Chloride: 102 mmol/L (ref 98–107)
Creatinine: 0.9 mg/dL (ref 0.7–1.3)
GFR African American: 100 mL/min/{1.73_m2} (ref >=90)
GFR Non-African American: 86 mL/min/{1.73_m2} — ABNORMAL LOW (ref >=90)
Globulin: 2 g/dL (ref 1.9–4.4)
Glucose: 96 mg/dL (ref 70–99)
Osmolaliy Calculated: 280 mosm/kg (ref 270–287)
Potassium: 4.9 mmol/L (ref 3.5–5.3)
Sodium: 139 mmol/L (ref 135–145)
Total Bilirubin: 0.6 mg/dL (ref 0.00–1.20)
Total Protein: 7 g/dL (ref 6.4–8.3)

## 2019-05-11 LAB — LIPID PANEL
Chol/HDL Ratio: 2 (ref 0.0–4.4)
Cholesterol: 135 mg/dL (ref 100–200)
HDL: 67 mg/dL (ref 55–72)
LDL Cholesterol: 56.8 mg/dL (ref 0.0–100.0)
LDL/HDL Ratio: 0.8
Triglycerides: 56 mg/dL (ref 0–149)
VLDL: 11.2 mg/dL (ref 5.0–40.0)

## 2019-05-11 LAB — HEMOGLOBIN A1C
Est. Avg. Glucose, WB: 111
Est. Avg. Glucose-calculated: 118
Hemoglobin A1C: 5.5 % (ref 4.0–6.0)

## 2019-05-11 LAB — PSA SCREENING: PSA, Screening: 1.02 ng/mL (ref 0.000–4.000)

## 2019-05-11 LAB — TSH WITH REFLEX TO FT4: TSH: 2.64 mcIU/mL (ref 0.358–3.740)

## 2019-10-20 LAB — COMPREHENSIVE METABOLIC PANEL
ALT: 20 U/L (ref 0–41)
AST: 19 U/L (ref 0–40)
Albumin/Globulin Ratio: 1.7 mmol/L (ref 1.00–2.70)
Albumin: 4.3 g/dL (ref 3.5–5.2)
Alk Phosphatase: 39 U/L — ABNORMAL LOW (ref 40–130)
Anion Gap: 8 mmol/L (ref 2–17)
BUN: 19 mg/dL (ref 8–23)
CO2: 26 mmol/L (ref 22–29)
Calcium: 9.3 mg/dL (ref 8.8–10.2)
Chloride: 104 mmol/L (ref 98–107)
Creatinine: 0.9 mg/dL (ref 0.7–1.3)
GFR African American: 100 mL/min/{1.73_m2} (ref >=90)
GFR Non-African American: 86 mL/min/{1.73_m2} — ABNORMAL LOW (ref >=90)
Globulin: 3 g/dL (ref 1.9–4.4)
Glucose: 100 mg/dL — ABNORMAL HIGH (ref 70–99)
Osmolaliy Calculated: 278 mosm/kg (ref 270–287)
Potassium: 4 mmol/L (ref 3.5–5.3)
Sodium: 138 mmol/L (ref 135–145)
Total Bilirubin: 0.3 mg/dL (ref 0.00–1.20)
Total Protein: 6.8 g/dL (ref 6.4–8.3)

## 2019-10-20 LAB — HEMOGLOBIN A1C
Est. Avg. Glucose, WB: 114
Est. Avg. Glucose-calculated: 122
Hemoglobin A1C: 5.6 % (ref 4.0–6.0)

## 2019-11-01 NOTE — Progress Notes (Signed)
Outpatient PT Certification Letter-Text       PT Outpatient Certification Letter Entered On:  11/01/2019 10:50 EDT    Performed On:  11/01/2019 10:47 EDT by Jerlyn Ly, Devin               Physician Certification   Date of Injury or Surgery :   06/18/2019 EDT   Ordering Physician Name :   Vernie Shanks N-MD   Number of Visits This Interval :   1   Dear Physician :   Thank you for your referral. Below is the patient information for the stated interval of treatment. Please review, modify (if necessary), sign, and return. Thank you.   Date of Evaluation :   11/01/2019 EDT   PT Certification Interval End :   01/29/2020 EST   Physician Signature Required :   Yes   Staff Physician Signature :   The physician's electronic signature noted above indicates approval of the documented Plan of Care for the stated interval.   Bushkar, PT, Devin - 11/01/2019 10:47 EDT   Plan   PT Frequency Outpatient :   Tu/Th   PT Duration Outpatient :   90    PT Duration Unit Outpatient :   Days   PT Anticipated Treatments, Needs :   Electrical stimulation, Functional training, Joint mobilization, Lumbar stabilization, Lumbar traction, Manual therapy, Moist heat/ice, Posture/Body mechanics training, Patient education, Self Care Management, Soft tissue massage/MFR, Therapeutic activities, Therapeutic exercises, Ultrasound   PT Certification Letter Complete :   Yes   Bushkar, PT, Devin - 11/01/2019 10:47 EDT   Outpatient Review   *Clinical Assessment Summary :   Pt presents to PT c c/o episodic low back pain as well as R LE radicular symptoms after long drives in the car. He demonstrates some spinal ROM limitations as well as core muscle weakness and would benefit from PT intervention to address these impairments in order to increase his activity tolerance without pain.      Rehab Potential Physical Therapy :   Maryann Alar, PT, Devin - 11/01/2019 10:47 EDT   Prior Functional Level Grid   ADL :   Independent   Mobility :   Independent   Instrumental  ADL :   Independent   Cognitive-Communication Skills :   Independent   Bushkar, PT, Devin - 11/01/2019 10:47 EDT   PT Impairments or Limitations :   Pain limiting function   Bushkar, PT, Devin - 11/01/2019 10:47 EDT    Signature Line                                                 Electronically Signed On 11/01/19 10:47 AM                                               _________________________________________________                                               Sharion Dove, PT, Devin  Electronically Signed On 11/14/19 08:17 AM                                               _________________________________________________                                                Vernie Shanks N-MD                  Dicatation Date: 11/01/19 10:47 AM

## 2020-05-31 LAB — CBC WITH AUTO DIFFERENTIAL
Absolute Baso #: 0 10*3/uL (ref 0.0–0.2)
Absolute Eos #: 0.1 10*3/uL (ref 0.0–0.5)
Absolute Lymph #: 1.2 10*3/uL (ref 1.0–3.2)
Absolute Mono #: 0.4 10*3/uL (ref 0.3–1.0)
Basophils %: 0.7 % (ref 0.0–2.0)
Eosinophils %: 1.5 % (ref 0.0–7.0)
Hematocrit: 36.1 % — ABNORMAL LOW (ref 38.0–52.0)
Hemoglobin: 12.1 g/dL — ABNORMAL LOW (ref 13.0–17.3)
Immature Grans (Abs): 0.01 10*3/uL (ref 0.00–0.06)
Immature Granulocytes: 0.2 % (ref 0.0–0.6)
Lymphocytes: 28.2 % (ref 15.0–45.0)
MCH: 30.9 pg (ref 27.0–34.5)
MCHC: 33.5 g/dL (ref 32.0–36.0)
MCV: 92.1 fL (ref 84.0–100.0)
MPV: 11.7 fL (ref 7.2–13.2)
Monocytes: 10.5 % (ref 4.0–12.0)
NRBC Absolute: 0 10*3/uL (ref 0.000–0.012)
NRBC Automated: 0 % (ref 0.0–0.2)
Neutrophils %: 58.9 % (ref 42.0–74.0)
Neutrophils Absolute: 2.4 10*3/uL (ref 1.6–7.3)
Platelets: 161 10*3/uL (ref 140–440)
RBC: 3.92 x10e6/mcL — ABNORMAL LOW (ref 4.00–5.60)
RDW: 12.6 % (ref 11.0–16.0)
WBC: 4.1 10*3/uL (ref 3.8–10.6)

## 2020-05-31 LAB — URINALYSIS W/ RFLX MICROSCOPIC
Bilirubin Urine: NEGATIVE
Blood, Urine: NEGATIVE
Glucose, UA: NEGATIVE mg/dL
Ketones, Urine: NEGATIVE mg/dL
Leukocyte Esterase, Urine: NEGATIVE
Nitrite, Urine: NEGATIVE
Protein, UA: NEGATIVE
Specific Gravity, UA: 1.025 (ref 1.003–1.035)
Urobilinogen, Urine: 0.2 EU/dL
pH, UA: 6 (ref 4.5–8.0)

## 2020-05-31 LAB — TSH WITH REFLEX TO FT4: TSH: 2.34 mcIU/mL (ref 0.358–3.740)

## 2020-05-31 LAB — COMPREHENSIVE METABOLIC PANEL
ALT: 19 U/L (ref 0–50)
AST: 19 U/L (ref 0–50)
Albumin/Globulin Ratio: 2.3 mmol/L (ref 1.00–2.70)
Albumin: 4.5 g/dL (ref 3.5–5.2)
Alk Phosphatase: 35 U/L — ABNORMAL LOW (ref 40–130)
Anion Gap: 11 mmol/L (ref 2–17)
BUN: 21 mg/dL (ref 8–23)
Bun/Cre Ratio: 23.3 — ABNORMAL HIGH (ref 6.0–17.0)
CO2: 26 mmol/L (ref 22–29)
Calcium: 9 mg/dL (ref 8.8–10.2)
Chloride: 104 mmol/L (ref 98–107)
Creatinine: 0.9 mg/dL (ref 0.7–1.3)
GFR African American: 99 mL/min/{1.73_m2} (ref >=90)
GFR Non-African American: 86 mL/min/{1.73_m2} — ABNORMAL LOW (ref >=90)
Globulin: 2 g/dL (ref 1.9–4.4)
Glucose: 95 mg/dL (ref 70–99)
OSMOLALITY CALCULATED: 284 mOsm/kg (ref 270–287)
Potassium: 4.3 mmol/L (ref 3.5–5.3)
Sodium: 141 mmol/L (ref 135–145)
Total Bilirubin: 0.49 mg/dL (ref 0.00–1.20)
Total Protein: 6.5 g/dL (ref 6.4–8.3)

## 2020-05-31 LAB — LIPID PANEL
Chol/HDL Ratio: 2.2 (ref 0.0–4.4)
Cholesterol: 126 mg/dL (ref 100–200)
HDL: 58 mg/dL (ref >=40)
LDL Cholesterol: 55.2 mg/dL (ref 0.0–100.0)
LDL/HDL Ratio: 1
Triglycerides: 64 mg/dL (ref 0–149)
VLDL: 12.8 mg/dL (ref 5.0–40.0)

## 2020-05-31 LAB — HEMOGLOBIN A1C
Est. Avg. Glucose, WB: 117
Est. Avg. Glucose-calculated: 126
Hemoglobin A1C: 5.7 % (ref 4.0–6.0)

## 2020-05-31 LAB — PSA SCREENING: Screening PSA: 1.14 ng/mL (ref 0.000–4.000)

## 2020-06-21 LAB — IRON AND TIBC
Iron Saturation: 25 % (ref 20–40)
Iron: 75 ug/dL (ref 59–158)
TIBC: 297 ug/dL (ref 250–450)
UIBC: 222 ug/dL (ref 112.0–347.0)

## 2020-06-21 LAB — CBC WITH AUTO DIFFERENTIAL
Absolute Baso #: 0 10*3/uL (ref 0.0–0.2)
Absolute Eos #: 0.1 10*3/uL (ref 0.0–0.5)
Absolute Lymph #: 1.4 10*3/uL (ref 1.0–3.2)
Absolute Mono #: 0.5 10*3/uL (ref 0.3–1.0)
Basophils %: 0.7 % (ref 0.0–2.0)
Eosinophils %: 1.2 % (ref 0.0–7.0)
Hematocrit: 38.4 % (ref 38.0–52.0)
Hemoglobin: 12.6 g/dL — ABNORMAL LOW (ref 13.0–17.3)
Immature Grans (Abs): 0.01 10*3/uL (ref 0.00–0.06)
Immature Granulocytes: 0.2 % (ref 0.0–0.6)
Lymphocytes: 23.7 % (ref 15.0–45.0)
MCH: 30.6 pg (ref 27.0–34.5)
MCHC: 32.8 g/dL (ref 32.0–36.0)
MCV: 93.2 fL (ref 84.0–100.0)
MPV: 12 fL (ref 7.2–13.2)
Monocytes: 7.9 % (ref 4.0–12.0)
NRBC Absolute: 0 10*3/uL (ref 0.000–0.012)
NRBC Automated: 0 % (ref 0.0–0.2)
Neutrophils %: 66.3 % (ref 42.0–74.0)
Neutrophils Absolute: 4 10*3/uL (ref 1.6–7.3)
Platelets: 153 10*3/uL (ref 140–440)
RBC: 4.12 x10e6/mcL (ref 4.00–5.60)
RDW: 12.7 % (ref 11.0–16.0)
WBC: 6 10*3/uL (ref 3.8–10.6)

## 2020-06-21 LAB — FERRITIN: Ferritin: 171 ng/mL (ref 30.0–400.0)

## 2020-06-21 LAB — FOLATE: Folate: 7.32 ng/mL (ref 4.80–24.20)

## 2020-06-21 LAB — VITAMIN B12: Vitamin B-12: 239 pg/mL (ref 232–1245)

## 2020-06-29 LAB — POC COVID-19 (LIAT IN HOUSE): SARS-CoV-2: NOT DETECTED

## 2020-10-05 MED ORDER — OMEPRAZOLE 20 MG PO CPDR
20 MG | ORAL_CAPSULE | Freq: Every day | ORAL | 1 refills | Status: AC
Start: 2020-10-05 — End: 2021-04-03

## 2020-10-05 NOTE — Telephone Encounter (Signed)
Pls es

## 2020-10-05 NOTE — Telephone Encounter (Signed)
Patient is requesting refill  What pharmacy would you like this sent to? [facility]   Have there been any medication changes since your last visit? No    Medication: omeprazole  Dosage: 20 MG  Frequency: 1x daily  Quantity: 90    Was taking omeprazole before then stopped taking it because did think he needed it but realized he does need it and would like it sent over.

## 2020-10-05 NOTE — Telephone Encounter (Signed)
INSTRUCTIONS ON SIG INCOMPLETE

## 2020-11-22 ENCOUNTER — Ambulatory Visit: Admit: 2020-11-22 | Discharge: 2020-11-22 | Payer: MEDICARE | Attending: Family | Primary: Family Medicine

## 2020-11-22 DIAGNOSIS — M25562 Pain in left knee: Secondary | ICD-10-CM

## 2020-11-22 NOTE — Progress Notes (Signed)
William [name] (DOB:  May 09, 1950) is a 70 y.o. male here for evaluation of the following chief complaint(s):  Leg Pain (Left knee pain, twisted a certain way on Tuesday and felt a twinge) and Other (Pt is in a research study for diabetes and give himself an injection weekly,does not recall the medication name )      Subjective   SUBJECTIVE/OBJECTIVE:  HPI  Here for evaluation of L knee pain present for the past day.  Does report 2 days ago, he noted twisting injury of knee.  Has tried tylenol with partial relief of symptoms.  Has not tried any other otc medications or home remedies for relief.  No other exacerbating or relieving factors noted.    Review of Systems  See HPI, all other systems reviewed and are negative.         Objective   Physical Exam  Vitals and nursing note reviewed.   Constitutional:       Appearance: Normal appearance.   Musculoskeletal:      Right knee: Normal.      Left knee: Swelling (trace swelling noted about the L knee) and effusion present. No deformity, erythema, ecchymosis, lacerations, bony tenderness or crepitus. Normal range of motion. No tenderness. Normal alignment. Normal pulse.   Skin:     General: Skin is warm and dry.   Neurological:      General: No focal deficit present.      Mental Status: He is alert and oriented to person, place, and time.   Psychiatric:         Mood and Affect: Mood normal.         Behavior: Behavior normal.         Thought Content: Thought content normal.         Judgment: Judgment normal.               ASSESSMENT/PLAN:  1. Acute pain of left knee  -     XR KNEE LEFT (3 VIEWS); Future  -     [facility] [address]    Results for orders placed or performed in visit on 11/22/20   XR KNEE LEFT (3 VIEWS)    Narrative    **Final**  Left knee AP, oblique, and lateral: 11/22/20    INDICATION: "M25.562 Pain in left knee;M25.562 Pain in left knee".    COMPARISON: None    FINDINGS: No acute fracture or periosteal reaction. Trace joint  effusion.  Tricompartmental osteophytosis with mild medial compartment joint space  narrowing. No focal lytic or sclerotic lesion. No evident soft tissue  abnormality.    IMPRESSION:    1. No acute fracture or dislocation.  2. Trace knee joint effusion.  3. 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      Releasing Radiologist:   Lowella Bandy THI-MD  Released Date and Time:  11/22/20 12:26      No acute bony abnormalities, there is trace joint effusion and mild arthritis.  Recommend tylenol arthritis scheduled twice daily with mid-day dose as needed.  Recommend gentle non weight bearing exercises such as swimming or bicycling.  Will go ahead and refer to ortho per pt request.  Hinged knee brace applied per pt request.  F?U with ortho as needed.  States understanding and is in agreement with plan.  Declines crutches today for ambulation assistance.      Return if symptoms worsen or fail to improve, for re-evaluation.  An electronic signature was used to authenticate this note.    --Charlann Noss, APRN - NP

## 2020-12-03 ENCOUNTER — Ambulatory Visit: Admit: 2020-12-03 | Discharge: 2020-12-03 | Payer: MEDICARE | Attending: Physician Assistant | Primary: Family Medicine

## 2020-12-03 DIAGNOSIS — M25562 Pain in left knee: Secondary | ICD-10-CM

## 2020-12-03 MED ORDER — BUPIVACAINE HCL 0.5 % IJ SOLN
0.5 % | Freq: Once | INTRAMUSCULAR | Status: AC
Start: 2020-12-03 — End: 2020-12-03
  Administered 2020-12-03: 14:00:00 20 mL via INTRA_ARTICULAR

## 2020-12-03 MED ORDER — TRIAMCINOLONE ACETONIDE 40 MG/ML IJ SUSP
40 MG/ML | Freq: Once | INTRAMUSCULAR | Status: AC
Start: 2020-12-03 — End: 2020-12-03
  Administered 2020-12-03: 14:00:00 80 mg via INTRA_ARTICULAR

## 2020-12-03 MED ORDER — LIDOCAINE HCL 1 % IJ SOLN
1 % | Freq: Once | INTRAMUSCULAR | Status: AC
Start: 2020-12-03 — End: 2020-12-03
  Administered 2020-12-03: 14:00:00 4 mL via INTRA_ARTICULAR

## 2020-12-03 NOTE — Progress Notes (Signed)
ASSESSMENT/PLAN:      1. Acute pain of left knee  -     triamcinolone acetonide (KENALOG-40) injection 80 mg; 80 mg, Intra-artICUlar, ONCE, 1 dose, On Mon 12/03/20 at 1015  -     bupivacaine (MARCAINE) 0.5 % injection 20 mg; 20 mg (4 mL), Intra-artICUlar, ONCE, 1 dose, On Mon 12/03/20 at 1015  -     lidocaine 1 % injection 4 mL; 4 mL, Intra-artICUlar, ONCE, 1 dose, On Mon 12/03/20 at 1015    Return in about 4 weeks (around 12/31/2020).     Findings and treatment options were discussed with the patient at length today. The patient has decided to proceed with a cortisone injection.  We discussed he likely had degenerative tear of the medial meniscus but is not experiencing any mechanical symptoms. The patient will follow up in 4-6 weeks or sooner should anything change or worsen. All of their questions were answered to the best of my ability.       As part of the treatment protocol, I have offered the patient a steroid injection into the left knee. The patient has consented to this injection. Therefore, under sterile technique, I have injected the knee with 4cc 1% plain Lidocaine, 4cc 0.5% plain Marcaine, and 80mg  Kenalog from an anteromedial approach . The patient tolerated this procedure well.     SUBJECTIVE:  William [name] (DOB: 12-02-1950) is a 69 y.o. male, patient,here for evaluation of the New Patient (LFT KNEE PAIN AND SWELLING doi 11/20/2020)   .   Patient presents today for left knee pain over the past 2 weeks.  He had a twisting episode where he was simply changing directions.  He did not  note pain initially but as the day went on he noted increased pain over the medial aspect of the knee.  He denies any mechanical symptoms.  He states it has slowly improved but not completely resolved.  Is worse with standing for prolonged periods.  He has tried Tylenol as well as over-the-counter brace which has helped some.  He has been icing as well.  He states he has had a arthroscopy for meniscus tear previously  but he is not sure which knee it was on.    PHYSICAL EXAM    General appearance: Cooperative, well-developed, well-nourished, and in no acute distress.  Integumentary: No rashes, lesions, or ulcers.   Head: Normocephalic and atraumatic.   Chest and lungs: Breathing unlabored with symmetric expansion.   Peripheral vascular: Lower extremity inspection is normal with normal dorsal pedal pulses and no edema bilaterally.   Neurologic: Alert and oriented x3; intact sensation to light touch; no focal neurological deficits  Musculoskeletal: Examination of the right knee shows no effusion.  Range of motion is 0-130 degree with pain with terminal flexion.  Positive McMurray test, positive medial joint line tenderness.  Negative crepitus. Negative anterior drawer, posterior drawer, varus and valgus stress tests, negative Lachman test.      Imaging:  3 views of the left knee dated 11/22/2020 are independently interpreted today show mild narrowing of the medial compartment with no other arthritic changes noted.  No loose bodies are noted.  No fracture or other pathology is observed.        No Known Allergies    Current Outpatient Medications   Medication Sig Dispense Refill   ??? aspirin 81 MG EC tablet Take 81 mg by mouth daily     ??? Coenzyme Q10 (COQ10 PO) Take by mouth     ???  omeprazole (PRILOSEC) 20 MG delayed release capsule Take 1 capsule by mouth Daily 90 capsule 1   ??? carvedilol (COREG) 6.25 MG tablet as directed Orally twice a day     ??? diphenhydrAMINE-APAP, sleep, (TYLENOL PM EXTRA STRENGTH) 25-500 MG tablet 1 tablet at bedtime as needed Orally Once a day for 30 day(s)     ??? Latanoprost (XELPROS) 0.005 % EMUL 1 null into affected eye in the evening Ophthalmic Once a day     ??? losartan (COZAAR) 50 MG tablet 1 tablet Orally once a day     ??? methylPREDNISolone (MEDROL DOSEPACK) 4 MG tablet      ??? nitroGLYCERIN (NITROSTAT) 0.4 MG SL tablet as directed Sublingual 1 tab under tongue q 5 minutes x 3 doses chest pain doe not help  call 911     ??? rosuvastatin (CRESTOR) 20 MG tablet 1 tablet Orally Once a day     ??? ticagrelor (BRILINTA) 90 MG TABS tablet 1 tablet Orally Twice a day (Patient not taking: Reported on 11/22/2020)       Current Facility-Administered Medications   Medication Dose Route Frequency Provider Last Rate Last Admin   ??? triamcinolone acetonide (KENALOG-40) injection 80 mg  80 mg Intra-artICUlar Once Jorge Retz T [place] Stenberg, PA-C       ??? bupivacaine (MARCAINE) 0.5 % injection 20 mg  4 mL Intra-artICUlar Once Barnell Shieh T Shellee Streng, PA-C       ??? lidocaine 1 % injection 4 mL  4 mL Intra-artICUlar Once Hewitt Garner T Json Koelzer, PA-C           Past Medical History:   Diagnosis Date   ??? Eye pressure    ??? GERD (gastroesophageal reflux disease)    ??? Heart attack (HCC)    ??? Hyperlipidemia    ??? Prediabetes        Past Surgical History:   Procedure Laterality Date   ??? BACK SURGERY     ??? CAROTID STENT     ??? KNEE CARTILAGE SURGERY     ??? TONSILLECTOMY         No family history on file.    Social History     Socioeconomic History   ??? Marital status: Married     Spouse name: Not on file   ??? Number of children: Not on file   ??? Years of education: Not on file   ??? Highest education level: Not on file   Occupational History   ??? Not on file   Tobacco Use   ??? Smoking status: Former     Types: Cigarettes     Quit date: 1984     Years since quitting: 38.8   ??? Smokeless tobacco: Never   Substance and Sexual Activity   ??? Alcohol use: Not on file   ??? Drug use: Not on file   ??? Sexual activity: Not on file   Other Topics Concern   ??? Not on file   Social History Narrative   ??? Not on file     Social Determinants of Health     Financial Resource Strain: Not on file   Food Insecurity: Not on file   Transportation Needs: Not on file   Physical Activity: Not on file   Stress: Not on file   Social Connections: Not on file   Intimate Partner Violence: Not on file   Housing Stability: Not on file       Review of Systems    ROS noncontributory other than mentioned in  HPI    Vitals:  Vitals:    12/03/20 0945   Weight: 270 lb (122.5 kg)   Height: 6\' 4"  (1.93 m)      Body mass index is 32.87 kg/m??.           An electronic signature was used to authenticate this note.  -- , PA-C

## 2020-12-05 NOTE — Telephone Encounter (Signed)
Patient called requesting return of knee brace received on 10/6 at [facility]. Brace is too small. Did not wear brace. Sched 10/24 at 10am at [facility] for return.

## 2020-12-21 NOTE — Telephone Encounter (Signed)
Brace returned 10/24 at DME center.

## 2020-12-24 ENCOUNTER — Ambulatory Visit: Admit: 2020-12-24 | Payer: MEDICARE | Attending: Physician Assistant | Primary: Family Medicine

## 2020-12-24 DIAGNOSIS — M25562 Pain in left knee: Secondary | ICD-10-CM

## 2020-12-24 NOTE — Progress Notes (Signed)
ASSESSMENT/PLAN:      1. Acute pain of left knee    Return if symptoms worsen or fail to improve.     Findings and options were discussed today.  He is improved overall and wants to continue with conservative treatments.  We discussed some activity modification as well as some home exercises as well as Voltaren gel topically over-the-counter.  At this point he will follow-up as needed should he have any issues or complaints moving forward.    SUBJECTIVE:  William [name] (DOB: 04/05/1950) is a 70 y.o. male, patient,here for evaluation of the Follow-up (Lft knee pain )  .   Patient is a 70 year old male seen today for follow-up of left knee pain.  When he was seen previously we did give him a cortisone injection for some early arthritis and probable meniscal pathology.  Overall he states he is improved but still feels some pain with standing or walking.  Today he points more over the patellar tendon as a source of pain rather than the medial joint line.  Continues to deny any mechanical symptoms.  He is taking Tylenol as needed for pain control    PHYSICAL EXAM    Examination of the left knee shows no effusion.  Range of motion 0 to 130 degrees with mild anterior pain with terminal flexion.  Medial joint line tenderness is present.  Negative McMurray's test.  5 out of 5 strength with knee flexion and extension.  He is tender over the midportion of the patellar tendon.  Distally is neurovascular intact.  Ligamentous exam is stable.    Imaging:          No Known Allergies    Current Outpatient Medications   Medication Sig Dispense Refill    aspirin 81 MG EC tablet Take 81 mg by mouth daily      Coenzyme Q10 (COQ10 PO) Take by mouth      omeprazole (PRILOSEC) 20 MG delayed release capsule Take 1 capsule by mouth Daily 90 capsule 1    carvedilol (COREG) 6.25 MG tablet as directed Orally twice a day      diphenhydrAMINE-APAP, sleep, (TYLENOL PM EXTRA STRENGTH) 25-500 MG tablet 1 tablet at bedtime as needed Orally Once  a day for 30 day(s)      Latanoprost (XELPROS) 0.005 % EMUL 1 null into affected eye in the evening Ophthalmic Once a day      losartan (COZAAR) 50 MG tablet 1 tablet Orally once a day      methylPREDNISolone (MEDROL DOSEPACK) 4 MG tablet       nitroGLYCERIN (NITROSTAT) 0.4 MG SL tablet as directed Sublingual 1 tab under tongue q 5 minutes x 3 doses chest pain doe not help call 911      rosuvastatin (CRESTOR) 20 MG tablet 1 tablet Orally Once a day       No current facility-administered medications for this visit.       Past Medical History:   Diagnosis Date    Eye pressure     GERD (gastroesophageal reflux disease)     Heart attack (HCC)     Hyperlipidemia     Prediabetes        Past Surgical History:   Procedure Laterality Date    BACK SURGERY      CAROTID STENT      KNEE CARTILAGE SURGERY      TONSILLECTOMY         No family history on file.    Social  History     Socioeconomic History    Marital status: Married     Spouse name: Not on file    Number of children: Not on file    Years of education: Not on file    Highest education level: Not on file   Occupational History    Not on file   Tobacco Use    Smoking status: Former     Types: Cigarettes     Quit date: 58     Years since quitting: 38.8    Smokeless tobacco: Never   Substance and Sexual Activity    Alcohol use: Not on file    Drug use: Not on file    Sexual activity: Not on file   Other Topics Concern    Not on file   Social History Narrative    Not on file     Social Determinants of Health     Financial Resource Strain: Not on file   Food Insecurity: Not on file   Transportation Needs: Not on file   Physical Activity: Not on file   Stress: Not on file   Social Connections: Not on file   Intimate Partner Violence: Not on file   Housing Stability: Not on file       Review of Systems    ROS noncontributory other than mentioned in HPI    Vitals:  Vitals:    12/24/20 0936   Weight: 262 lb (118.8 kg)   Height: 6\' 4"  (1.93 m)      Body mass index is 31.89  kg/m??.           An electronic signature was used to authenticate this note.  -- , PA-C

## 2021-03-11 MED ORDER — OMEPRAZOLE 20 MG PO CPDR
20 MG | ORAL_CAPSULE | ORAL | 1 refills | Status: DC
Start: 2021-03-11 — End: 2021-06-06

## 2021-03-11 NOTE — Telephone Encounter (Signed)
Pls es

## 2021-05-23 MED ORDER — NIRMATRELVIR&RITONAVIR 300/100 20 X 150 MG & 10 X 100MG PO TBPK
2015010100 x 150 MG & 10 x 100MG | ORAL_TABLET | Freq: Two times a day (BID) | ORAL | 0 refills | Status: AC
Start: 2021-05-23 — End: 2021-06-06

## 2021-05-23 NOTE — Telephone Encounter (Signed)
No appts avail with you, [place], or [place]. If ok, pls es and send back to me.

## 2021-05-23 NOTE — Telephone Encounter (Signed)
Patient test positive with COVID last night with home test. Patient states symptoms started two days ago with sore throat, then cough, fatigue and congestion. [facility] Please advise.

## 2021-05-31 ENCOUNTER — Telehealth

## 2021-05-31 NOTE — Telephone Encounter (Signed)
Lab order in

## 2021-05-31 NOTE — Telephone Encounter (Signed)
Lab Request:  What type of appointment? [facility]  Date of the appointment? 4.20.2023  Will labs be drawn at a [facility] facility? Yes Did the patient ask for any specific labs?no.  Pt wants to know if any labs need to be done before his appt.

## 2021-06-04 ENCOUNTER — Encounter

## 2021-06-04 LAB — COMPREHENSIVE METABOLIC PANEL
ALT: 15 U/L (ref 0–50)
AST: 19 U/L (ref 0–50)
Albumin/Globulin Ratio: 1.9 (ref 1.00–2.70)
Albumin: 4.2 g/dL (ref 3.5–5.2)
Alk Phosphatase: 49 U/L (ref 40–130)
Anion Gap: 10 mmol/L (ref 2–17)
BUN: 20 mg/dL (ref 8–23)
CO2: 27 mmol/L (ref 22–29)
Calcium: 9.3 mg/dL (ref 8.8–10.2)
Chloride: 104 mmol/L (ref 98–107)
Creatinine: 1 mg/dL (ref 0.7–1.3)
Est, Glom Filt Rate: 80 mL/min/1.73m (ref >=60)
Globulin: 2.2 g/dL (ref 1.9–4.4)
Glucose: 96 mg/dL (ref 70–99)
OSMOLALITY CALCULATED: 284 mOsm/kg (ref 270–287)
Potassium: 4.7 mmol/L (ref 3.5–5.3)
Sodium: 141 mmol/L (ref 135–145)
Total Bilirubin: 0.4 mg/dL (ref 0.00–1.20)
Total Protein: 6.4 g/dL (ref 6.4–8.3)

## 2021-06-04 LAB — LIPID PANEL
Chol/HDL Ratio: 2.4 (ref 0.0–4.4)
Cholesterol: 141 mg/dL (ref 100–200)
HDL: 59 mg/dL (ref >=40)
LDL Cholesterol: 58.2 mg/dL (ref 0.0–100.0)
LDL/HDL Ratio: 1
Triglycerides: 119 mg/dL (ref 0–149)
VLDL: 23.8 mg/dL (ref 5.0–40.0)

## 2021-06-04 LAB — CBC WITH AUTO DIFFERENTIAL
Absolute Baso #: 0 10*3/uL (ref 0.0–0.2)
Absolute Eos #: 0.1 10*3/uL (ref 0.0–0.5)
Absolute Lymph #: 1.2 10*3/uL (ref 1.0–3.2)
Absolute Mono #: 0.4 10*3/uL (ref 0.3–1.0)
Basophils %: 0.7 % (ref 0.0–2.0)
Eosinophils %: 1.1 % (ref 0.0–7.0)
Hematocrit: 38.4 % (ref 38.0–52.0)
Hemoglobin: 12.9 g/dL — ABNORMAL LOW (ref 13.0–17.3)
Immature Grans (Abs): 0.02 10*3/uL (ref 0.00–0.06)
Immature Granulocytes: 0.4 % (ref 0.0–0.6)
Lymphocytes: 21.7 % (ref 15.0–45.0)
MCH: 30.5 pg (ref 27.0–34.5)
MCHC: 33.6 g/dL (ref 32.0–36.0)
MCV: 90.8 fL (ref 84.0–100.0)
MPV: 11.5 fL (ref 7.2–13.2)
Monocytes: 8.1 % (ref 4.0–12.0)
NRBC Absolute: 0 10*3/uL (ref 0.000–0.012)
NRBC Automated: 0 % (ref 0.0–0.2)
Neutrophils %: 68 % (ref 42.0–74.0)
Neutrophils Absolute: 3.7 10*3/uL (ref 1.6–7.3)
Platelets: 180 10*3/uL (ref 140–440)
RBC: 4.23 x10e6/mcL (ref 4.00–5.60)
RDW: 12.3 % (ref 11.0–16.0)
WBC: 5.4 10*3/uL (ref 3.8–10.6)

## 2021-06-04 LAB — URINALYSIS W/ RFLX MICROSCOPIC
Bilirubin Urine: NEGATIVE
Blood, Urine: NEGATIVE
Glucose, UA: NEGATIVE
Ketones, Urine: NEGATIVE
Leukocyte Esterase, Urine: NEGATIVE
Nitrite, Urine: NEGATIVE
Protein, UA: NEGATIVE
Specific Gravity, UA: 1.027 (ref 1.003–1.035)
Urobilinogen, Urine: 1 EU/dL
pH, UA: 6 (ref 4.5–8.0)

## 2021-06-04 LAB — HEMOGLOBIN A1C
Est. Avg. Glucose, WB: 114
Est. Avg. Glucose-calculated: 122
Hemoglobin A1C: 5.6 % (ref 4.0–6.0)

## 2021-06-04 LAB — TSH WITH REFLEX: TSH: 2.39 mcIU/mL (ref 0.358–3.740)

## 2021-06-04 LAB — PSA SCREENING: Screening PSA: 1.19 ng/mL (ref 0.000–4.000)

## 2021-06-04 NOTE — Other (Signed)
We will review labs at upcoming appointment.Call us if you need to reschedule.

## 2021-06-06 ENCOUNTER — Encounter: Admit: 2021-06-06 | Discharge: 2021-06-06 | Payer: MEDICARE | Attending: Family Medicine | Primary: Family Medicine

## 2021-06-06 DIAGNOSIS — R739 Hyperglycemia, unspecified: Secondary | ICD-10-CM

## 2021-06-06 MED ORDER — LOSARTAN POTASSIUM 100 MG PO TABS
100 MG | ORAL_TABLET | ORAL | 1 refills | Status: DC
Start: 2021-06-06 — End: 2021-09-08

## 2021-06-06 MED ORDER — OZEMPIC (2 MG/DOSE) 8 MG/3ML SC SOPN
8 MG/3ML | SUBCUTANEOUS | 3 refills | Status: AC
Start: 2021-06-06 — End: 2022-07-23

## 2021-06-06 MED ORDER — NITROGLYCERIN 0.4 MG SL SUBL
0.4 MG | ORAL_TABLET | SUBLINGUAL | 5 refills | Status: AC
Start: 2021-06-06 — End: 2023-04-17

## 2021-06-06 MED ORDER — OMEPRAZOLE 20 MG PO CPDR
20 MG | ORAL_CAPSULE | Freq: Every day | ORAL | 1 refills | Status: DC
Start: 2021-06-06 — End: 2021-10-18

## 2021-06-06 MED ORDER — LOSARTAN POTASSIUM 50 MG PO TABS
50 MG | ORAL_TABLET | ORAL | 1 refills | Status: DC
Start: 2021-06-06 — End: 2021-06-06

## 2021-06-06 MED ORDER — CARVEDILOL 6.25 MG PO TABS
6.25 MG | ORAL_TABLET | ORAL | 1 refills | Status: AC
Start: 2021-06-06 — End: ?

## 2021-06-06 MED ORDER — ROSUVASTATIN CALCIUM 20 MG PO TABS
20 MG | ORAL_TABLET | ORAL | 1 refills | Status: DC
Start: 2021-06-06 — End: 2021-08-22

## 2021-06-06 NOTE — Patient Instructions (Signed)
Starting a Weight Loss Plan: Care Instructions  Overview     If you're thinking about losing weight, it can be hard to know where to start. Your doctor can help you set up a weight loss plan that best meets your needs. You may want to take a class on nutrition or exercise, or you could join a weight loss support group. If you have questions about how to make changes to your eating or exercise habits, ask your doctor about seeing a registered dietitian or an exercise specialist.  It can be a big challenge to lose weight. But you don't have to make huge changes at once. Make small changes, and stick with them. When those changes become habit, add a few more changes.  If you don't think you're ready to make changes right now, try to pick a date in the future. Make an appointment to see your doctor to discuss whether the time is right for you to start a plan.  Follow-up care is a key part of your treatment and safety. Be sure to make and go to all appointments, and call your doctor if you are having problems. It's also a good idea to know your test results and keep a list of the medicines you take.  How can you care for yourself at home?  Set realistic goals. Many people expect to lose much more weight than is likely. A weight loss of 5% to 10% of your body weight may be enough to improve your health.  Get family and friends involved to provide support. Talk to them about why you are trying to lose weight, and ask them to help. They can help by participating in exercise and having meals with you, even if they may be eating something different.  Find what works best for you. If you do not have time or do not like to cook, a program that offers meal replacement bars or shakes may be better for you. Or if you like to prepare meals, finding a plan that includes daily menus and recipes may be best.  Ask your doctor about other health professionals who can help you achieve your weight loss goals.  A dietitian can help  you make healthy changes in your diet.  An exercise specialist or personal trainer can help you develop a safe and effective exercise program.  A counselor or psychiatrist can help you cope with issues such as depression, anxiety, or family problems that can make it hard to focus on weight loss.  Consider joining a support group for people who are trying to lose weight. Your doctor can suggest groups in your area.  Where can you learn more?  Go to https://www.bennett.info/ and enter U357 to learn more about "Starting a Weight Loss Plan: Care Instructions."  Current as of: May 9, 2022Content Version: 13.6   2006-2023 [employer], [employer].   Care instructions adapted under license by [facility]. If you have questions about a medical condition or this instruction, always ask your healthcare professional. [place] any warranty or liability for your use of this information.           A Healthy Heart: Care Instructions  Your Care Instructions     Coronary artery disease, also called heart disease, occurs when a substance called plaque builds up in the vessels that supply oxygen-rich blood to your heart muscle. This can narrow the blood vessels and reduce blood flow. A heart attack happens when blood flow is completely blocked. A  high-fat diet, smoking, and other factors increase the risk of heart disease.  Your doctor has found that you have a chance of having heart disease. You can do lots of things to keep your heart healthy. It may not be easy, but you can change your diet, exercise more, and quit smoking. These steps really work to lower your chance of heart disease.  Follow-up care is a key part of your treatment and safety. Be sure to make and go to all appointments, and call your doctor if you are having problems. It's also a good idea to know your test results and keep a list of the medicines you take.  How can you care for yourself at home?  Diet   Use  less salt when you cook and eat. This helps lower your blood pressure. Taste food before salting. Add only a little salt when you think you need it. With time, your taste buds will adjust to less salt.    Eat fewer snack items, fast foods, canned soups, and other high-salt, high-fat, processed foods.    Read food labels and try to avoid saturated and trans fats. They increase your risk of heart disease by raising cholesterol levels.    Limit the amount of solid fat-butter, margarine, and shortening-you eat. Use olive, peanut, or canola oil when you cook. Bake, broil, and steam foods instead of frying them.    Eat a variety of fruit and vegetables every day. Dark green, deep orange, red, or yellow fruits and vegetables are especially good for you. Examples include spinach, carrots, peaches, and berries.    Foods high in fiber can reduce your cholesterol and provide important vitamins and minerals. High-fiber foods include whole-grain cereals and breads, oatmeal, beans, brown rice, citrus fruits, and apples.    Eat lean proteins. Heart-healthy proteins include seafood, lean meats and poultry, eggs, beans, peas, nuts, seeds, and soy products.    Limit drinks and foods with added sugar. These include candy, desserts, and soda pop.   Lifestyle changes   If your doctor recommends it, get more exercise. Walking is a good choice. Bit by bit, increase the amount you walk every day. Try for at least 30 minutes on most days of the week. You also may want to swim, bike, or do other activities.    Do not smoke. If you need help quitting, talk to your doctor about stop-smoking programs and medicines. These can increase your chances of quitting for good. Quitting smoking may be the most important step you can take to protect your heart. It is never too late to quit.    Limit alcohol to 2 drinks a day for men and 1 drink a day for women. Too much alcohol can cause health problems.    Manage other health problems such as  diabetes, high blood pressure, and high cholesterol. If you think you may have a problem with alcohol or drug use, talk to your doctor.   Medicines   Take your medicines exactly as prescribed. Call your doctor if you think you are having a problem with your medicine.    If your doctor recommends aspirin, take the amount directed each day. Make sure you take aspirin and not another kind of pain reliever, such as acetaminophen (Tylenol).   When should you call for help?   Call 911 if you have symptoms of a heart attack. These may include:   Chest pain or pressure, or a strange feeling in the chest.  Sweating.    Shortness of breath.    Pain, pressure, or a strange feeling in the back, neck, jaw, or upper belly or in one or both shoulders or arms.    Lightheadedness or sudden weakness.    A fast or irregular heartbeat.   After you call 911, the operator may tell you to chew 1 adult-strength or 2 to 4 low-dose aspirin. Wait for an ambulance. Do not try to drive yourself.  Watch closely for changes in your health, and be sure to contact your doctor if you have any problems.  Where can you learn more?  Go to [employer] and enter F075 to learn more about "A Healthy Heart: Care Instructions."  Current as of: September 7, 2022Content Version: 13.6   2006-2023 Healthwise, Incorporated.   Care instructions adapted under license by [facility]. If you have questions about a medical condition or this instruction, always ask your healthcare professional. Healthwise, Incorporated disclaims any warranty or liability for your use of this information.      Personalized Preventive Plan for Gilmar Bua Long - 06/06/2021  Medicare offers a range of preventive health benefits. Some of the tests and screenings are paid in full while other may be subject to a deductible, co-insurance, and/or copay.    Some of these benefits include a comprehensive review of your medical history  including lifestyle, illnesses that may run in your family, and various assessments and screenings as appropriate.    After reviewing your medical record and screening and assessments performed today your provider may have ordered immunizations, labs, imaging, and/or referrals for you.  A list of these orders (if applicable) as well as your Preventive Care list are included within your After Visit Summary for your review.    Other Preventive Recommendations:    A preventive eye exam performed by an eye specialist is recommended every 1-2 years to screen for glaucoma; cataracts, macular degeneration, and other eye disorders.  A preventive dental visit is recommended every 6 months.  Try to get at least 150 minutes of exercise per week or 10,000 steps per day on a pedometer .  Order or download the FREE "Exercise & Physical Activity: Your Everyday Guide" from [employer]. Call [phone] or search [employer] online.  You need 1200-1500 mg of calcium and 1000-2000 IU of vitamin D per day. It is possible to meet your calcium requirement with diet alone, but a vitamin D supplement is usually necessary to meet this goal.  When exposed to the sun, use a sunscreen that protects against both UVA and UVB radiation with an SPF of 30 or greater. Reapply every 2 to 3 hours or after sweating, drying off with a towel, or swimming.  Always wear a seat belt when traveling in a car. Always wear a helmet when riding a bicycle or motorcycle.

## 2021-06-06 NOTE — Progress Notes (Signed)
Medicare Annual Wellness Visit    William [name] is here for Medicare AWV (Research study May 2020 for "Major Adverse Cardiovascular Events")    Assessment & Plan   Acute hyperglycemia  Essential hypertension  -     carvedilol (COREG) 6.25 MG tablet; as directed Orally twice a day, Disp-180 tablet, R-1Normal  -     EKG 12 Lead; Future  -     losartan (COZAAR) 100 MG tablet; 1 tablet Orally once a day, Disp-90 tablet, R-1Normal  Medicare annual wellness visit, subsequent  -     Hemoglobin A1C; Future  Type 2 diabetes mellitus without complication, without long-term current use of insulin (HCC)  -     nitroGLYCERIN (NITROSTAT) 0.4 MG SL tablet; as directed Sublingual 1 tab under tongue q 5 minutes x 3 doses chest pain doe not help call 911, Disp-25 tablet, R-5Normal  -     Semaglutide, 2 MG/DOSE, (OZEMPIC, 2 MG/DOSE,) 8 MG/3ML SOPN; Inject 2 mg into the skin once a week, Disp-9 mL, R-3Normal  Atherosclerosis of native coronary artery of native heart without angina pectoris  -     CBC with Auto Differential; Future  Mixed hyperlipidemia  -     rosuvastatin (CRESTOR) 20 MG tablet; 1 tablet Orally Once a day, Disp-90 tablet, R-1Normal  -     Lipid Panel; Future  Gastroesophageal reflux disease without esophagitis  -     omeprazole (PRILOSEC) 20 MG delayed release capsule; Take 1 capsule by mouth daily, Disp-90 capsule, R-1Normal      Recommendations for Preventive Services Due: see orders and patient instructions/AVS.  Recommended screening schedule for the next 5-10 years is provided to the patient in written form: see Patient Instructions/AVS.    Reviewed HRA health maintenance and lab work.  For diabetes patient has been on semaglutide 2.4 mg through a study.  We are going to change him to Ozempic but now that he is off of the study.  Hypertension is uncontrolled we increased his losartan to 100.  Diabetes is controlled because he has been on metformin and Ozempic.  CAD is stable.  Hyperlipidemia is controlled.   GERD is stable.     Return in about 4 weeks (around 07/04/2021), or 3 months as well.     Subjective       Patient's complete Health Risk Assessment and screening values have been reviewed and are found in Flowsheets. The following problems were reviewed today and where indicated follow up appointments were made and/or referrals ordered.  Patient is here for follow-up of chronic medical problems and AWV he is doing really well he has been in a study involving semaglutide 2.4 versus placebo.  Is been on it since 2020 and is lost a lot of weight has nausea and his A1c has been greatly improved he did not know if he was on it active drug or not but believes he was on active drug.  Positive Risk Factor Screenings with Interventions:         Alcohol Screening:  Alcohol Use: Heavy Drinker    Frequency of Alcohol Consumption: 2-3 times a week    Average Number of Drinks: 3 or 4    Frequency of Binge Drinking: Never      AUDIT-C Score: 4   AUDIT Total Score: 4    Interpretation of AUDIT-C score:   3-7 indicates potential alcohol risk.    8 or more is associated with harmful or hazardous drinking.   13 or more  in women, and 15 or more in men, is likely to indicate alcohol dependence.  Interventions:  Alcohol consumption guidelines reviewed. Moderation recommended               Weight and Activity:  Physical Activity: Insufficiently Active    Days of Exercise per Week: 1 day    Minutes of Exercise per Session: 30 min     On average, how many days per week do you engage in moderate to strenuous exercise (like a brisk walk)?: 1 day  Have you lost any weight without trying in the past 3 months?: No  Body Mass Index: (!) 32.98    Obesity Interventions:  See A/P for plan and any pertinent orders                               Objective   Vitals:    06/06/21 1421   BP: (!) 142/72   Site: Left Upper Arm   Position: Sitting   Cuff Size: Large Adult   Pulse: 68   Temp: 98.8 F (37.1 C)   TempSrc: Oral   SpO2: 98%   Weight: 271 lb  (122.9 kg)   Height: 6\' 4"  (1.93 m)      Body mass index is 32.99 kg/m.        General Appearance: alert and oriented to person, place and time, well developed and well- nourished, in no acute distress  Skin: warm and dry, no rash or erythema  Head: normocephalic and atraumatic  Eyes: pupils equal, round, and reactive to light, extraocular eye movements intact, conjunctivae normal  ENT: tympanic membrane, external ear and ear canal normal bilaterally, nose without deformity, nasal mucosa and turbinates normal without polyps  Neck: supple and non-tender without mass, no thyromegaly or thyroid nodules, no cervical lymphadenopathy  Pulmonary/Chest: clear to auscultation bilaterally- no wheezes, rales or rhonchi, normal air movement, no respiratory distress  Cardiovascular: normal rate, regular rhythm, normal S1 and S2, no murmurs, rubs, clicks, or gallops, distal pulses intact, no carotid bruits  Abdomen: soft, non-tender, non-distended, normal bowel sounds, no masses or organomegaly  Extremities: no cyanosis, clubbing or edema  Musculoskeletal: normal range of motion, no joint swelling, deformity or tenderness  Neurologic: reflexes normal and symmetric, no cranial nerve deficit, gait, coordination and speech normal       No Known Allergies  Prior to Visit Medications    Medication Sig Taking? Authorizing Provider   omeprazole (PRILOSEC) 20 MG delayed release capsule Take 1 capsule by mouth daily Yes ., MD   rosuvastatin (CRESTOR) 20 MG tablet 1 tablet Orally Once a day Yes Claybon Jabs., MD   carvedilol (COREG) 6.25 MG tablet as directed Orally twice a day Yes Claybon Jabs., MD   nitroGLYCERIN (NITROSTAT) 0.4 MG SL tablet as directed Sublingual 1 tab under tongue q 5 minutes x 3 doses chest pain doe not help call 911 Yes Claybon Jabs., MD   Semaglutide, 2 MG/DOSE, (OZEMPIC, 2 MG/DOSE,) 8 MG/3ML SOPN Inject 2 mg into the skin once a week Yes Claybon Jabs., MD   losartan (COZAAR) 100 MG tablet 1  tablet Orally once a day Yes Claybon Jabs., MD   aspirin 81 MG EC tablet Take 1 tablet by mouth daily Yes Historical Provider, MD   Coenzyme Q10 (COQ10 PO) Take by mouth Yes Historical Provider, MD   diphenhydrAMINE-APAP, sleep, (  TYLENOL PM EXTRA STRENGTH) 25-500 MG tablet 1 tablet at bedtime as needed Orally Once a day for 30 day(s) Yes [facility] Automatic Reconciliation, MD   Latanoprost (XELPROS) 0.005 % EMUL 1 null into affected eye in the evening Ophthalmic Once a day Yes [facility] Automatic Reconciliation, MD   methylPREDNISolone (MEDROL DOSEPACK) 4 MG tablet  Yes [facility] Automatic Reconciliation, MD       CareTeam (Including outside providers/suppliers regularly involved in providing care):   Patient Care Team:  Claybon Jabs., MD as PCP - General  Claybon Jabs., MD as PCP - Empaneled Provider     Reviewed and updated this visit:  Tobacco  Allergies  Meds  Problems  Med Hx  Surg Hx  Soc Hx  Fam Hx               Claybon Jabs, MD

## 2021-06-26 ENCOUNTER — Ambulatory Visit: Admit: 2021-06-26 | Discharge: 2021-06-26 | Payer: MEDICARE | Attending: Family | Primary: Family Medicine

## 2021-06-26 DIAGNOSIS — R1013 Epigastric pain: Secondary | ICD-10-CM

## 2021-06-26 NOTE — Progress Notes (Signed)
Routine OV  Name: William [name] JHERD'E Date: 06/26/2021   MRN: [id number] Sex: Male   Age: 71 y.o. Ethnicity: Non-Hispanic / Non Latino   DOB: 13-Jun-1950 Race: White (non-Hispanic)        Chief Complaint   Patient presents with    Abdominal Pain     Upper stomach pain          HISTORY OF PRESENT ILLNESS      Pt is c/o an episode of severe epigastric pain that occurred  June 09, 2021 while on a cruise after dinner. Pain was a 10/10 - lasting about 6 hrs. He was mildly nauseated, no stool changes associated with the discomfort. Took a pain pill he brought with him on cruise  The next day sx were gone.  Since then similar epigastric pain has occurred on 2 occasions but much milder.  Since then about once per week he has loose stool.  Patient did note when symptoms have occurred seems to be after eating fried foods    Patient denies chest pain, heartburn, diarrhea, constipation, back or flank pain, dysuria, fever chills, cough or headache.    No Known Allergies      Prior to Visit Medications    Medication Sig Taking? Authorizing Provider   fluticasone (FLONASE) 50 MCG/ACT nasal spray 1 spray by Each Nostril route daily Yes Historical Provider, MD   omeprazole (PRILOSEC) 20 MG delayed release capsule Take 1 capsule by mouth daily  Claybon Jabs., MD   rosuvastatin (CRESTOR) 20 MG tablet 1 tablet Orally Once a day  Claybon Jabs., MD   carvedilol (COREG) 6.25 MG tablet as directed Orally twice a day  Claybon Jabs., MD   nitroGLYCERIN (NITROSTAT) 0.4 MG SL tablet as directed Sublingual 1 tab under tongue q 5 minutes x 3 doses chest pain doe not help call 911  Claybon Jabs., MD   Semaglutide, 2 MG/DOSE, (OZEMPIC, 2 MG/DOSE,) 8 MG/3ML SOPN Inject 2 mg into the skin once a week  Claybon Jabs., MD   losartan (COZAAR) 100 MG tablet 1 tablet Orally once a day  Claybon Jabs., MD   aspirin 81 MG EC tablet Take 1 tablet by mouth daily  Historical Provider, MD   Coenzyme Q10 (COQ10 PO) Take by mouth  Historical Provider,  MD   diphenhydrAMINE-APAP, sleep, (TYLENOL PM EXTRA STRENGTH) 25-500 MG tablet 1 tablet at bedtime as needed Orally Once a day for 30 day(s)  [facility] Automatic Reconciliation, MD   Latanoprost (XELPROS) 0.005 % EMUL 1 null into affected eye in the evening Ophthalmic Once a day  [facility] Automatic Reconciliation, MD   methylPREDNISolone (MEDROL DOSEPACK) 4 MG tablet   [facility] Automatic Reconciliation, MD         Past Medical History:   Diagnosis Date    Eye pressure     GERD (gastroesophageal reflux disease)     Heart attack (HCC)     Hyperlipidemia     Hypertension     Prediabetes        Past Surgical History:   Procedure Laterality Date    BACK SURGERY      CAROTID STENT      KNEE CARTILAGE SURGERY      TONSILLECTOMY           Family History   Problem Relation Age of Onset    Alzheimer's Disease Mother  TIA    Heart Failure Father         DM    Heart Failure Maternal Grandmother     No Known Problems Maternal Grandfather     No Known Problems Paternal Grandmother     No Known Problems Paternal Grandfather        Social History     Tobacco Use    Smoking status: Former     Types: Cigarettes     Quit date: 1984     Years since quitting: 39.3    Smokeless tobacco: Never       REVIEW OF SYSTEMS  Other than what was mentioned in the history of present illness, a thorough 11 system review of system was done and was negative.    Objective   BP (!) 140/74   Pulse 67   Wt 272 lb (123.4 kg)   BMI 33.11 kg/m    Wt Readings from Last 3 Encounters:   06/26/21 272 lb (123.4 kg)   06/06/21 271 lb (122.9 kg)   12/24/20 262 lb (118.8 kg)       EXAMINATION    GENERAL APPEARANCE: normal, pleasant, in no acute distress.   HEAD: normocephalic, atraumatic.   EENT:  Eye: OU clear conjuctiva, Ears: bilaterally canal clear, TM translucent with LM present, O/P - clear  NECK: FROM, no LAD, no thyromegaly, nontender  LUNGS: clear to auscultation bilaterally. Non labored breath sounds  ABDOMEN: soft, non distended, nontender,  no palpable mass, no HSM, active bowel sounds x 4  SKIN:  no rash   EXTREMITIES: no edema   PERIPHERAL PULSES: 2+ radial, 2+ PT  NEUROLOGIC: nonfocal, alert and oriented, cognitive exam grossly normal, deep tendon reflexes 2+ symmetrical, motor strength normal upper and lower extremities.  Psych: mood appropriate, goal oriented, alert and oriented x 3     ASSESSMENT/PLAN    1. Colicky epigastric pain  -     US ABDOMEN LIMITED; Future  -     Basic Metabolic Panel; Future  -     CBC with Auto Differential; Future  -     Lipase; Future  2. Nausea  -     Basic Metabolic Panel; Future  -     CBC with Auto Differential; Future  -     Lipase; Future        Current Outpatient Medications   Medication Sig Dispense Refill    fluticasone (FLONASE) 50 MCG/ACT nasal spray 1 spray by Each Nostril route daily      omeprazole (PRILOSEC) 20 MG delayed release capsule Take 1 capsule by mouth daily 90 capsule 1    rosuvastatin (CRESTOR) 20 MG tablet 1 tablet Orally Once a day 90 tablet 1    carvedilol (COREG) 6.25 MG tablet as directed Orally twice a day 180 tablet 1    nitroGLYCERIN (NITROSTAT) 0.4 MG SL tablet as directed Sublingual 1 tab under tongue q 5 minutes x 3 doses chest pain doe not help call 911 25 tablet 5    Semaglutide, 2 MG/DOSE, (OZEMPIC, 2 MG/DOSE,) 8 MG/3ML SOPN Inject 2 mg into the skin once a week 9 mL 3    losartan (COZAAR) 100 MG tablet 1 tablet Orally once a day 90 tablet 1    aspirin 81 MG EC tablet Take 1 tablet by mouth daily      Coenzyme Q10 (COQ10 PO) Take by mouth      diphenhydrAMINE-APAP, sleep, (TYLENOL PM EXTRA STRENGTH) 25-500 MG tablet 1 tablet  at bedtime as needed Orally Once a day for 30 day(s)      Latanoprost (XELPROS) 0.005 % EMUL 1 null into affected eye in the evening Ophthalmic Once a day       No current facility-administered medications for this visit.        There is concern that patient's upper abdominal mostly epigastric pain may be due to gallbladder disease.  His symptoms are colicky  severe once recurred twice since over the last month.  Stools are occasionally loose.  He will obtain abdominal ultrasound over the next week.  Depending on those results further plan of care will be determined.  He will be contacted when results are available.  Recommended that he avoid fatty foods and large meals.  Should continue all medications as prescribed      Grayce Sessions, APRN - NP

## 2021-06-29 ENCOUNTER — Ambulatory Visit: Payer: MEDICARE | Primary: Family Medicine

## 2021-06-29 DIAGNOSIS — R1013 Epigastric pain: Secondary | ICD-10-CM

## 2021-07-01 ENCOUNTER — Telehealth

## 2021-07-01 NOTE — Telephone Encounter (Signed)
Patient needs to be referred to GI for  colicky epigastric pain( non emergent)  with negative abdominal ultrasound  except did show hepatic steatosis and he has a history of GERD on PPI.  Has not seen GI in years may need EGD.  Needs to reestablish to discuss these issues.  Please check [facility] to see if he can see where he went in the past and refer back to that GI if cannot find then referred to [facility]

## 2021-07-04 NOTE — Addendum Note (Signed)
Addended by: Ulyses Southward on: 07/04/2021 03:18 PM     Modules accepted: Orders

## 2021-07-08 ENCOUNTER — Encounter: Admit: 2021-07-08 | Discharge: 2021-07-08 | Payer: MEDICARE | Attending: Family Medicine | Primary: Family Medicine

## 2021-07-08 DIAGNOSIS — E1165 Type 2 diabetes mellitus with hyperglycemia: Secondary | ICD-10-CM

## 2021-07-08 NOTE — Assessment & Plan Note (Signed)
Well-controlled, continue current medications

## 2021-07-08 NOTE — Progress Notes (Signed)
William [name] (DOB:  Dec 06, 1950) is a 71 y.o. male, here for evaluation of the following chief complaint(s):  Other (1 MONTH FOLLOW UP BP CHECK)           ASSESSMENT/PLAN:  1. Type 2 diabetes mellitus with hyperglycemia, without long-term current use of insulin ([place])  2. Atherosclerosis of native coronary artery of native heart with angina pectoris ([place])  3. Essential hypertension  4. Epigastric pain  Comments:  WILL NEED TO ORDER NM HIDAA SCAN IF SX RETURN.  OTHERWISE THEY SEEM RESOLVED.  Patient is doing great from hypertension standpoint no change to current medications.  We will get blood work drawn in 6 months to monitor A1c lipid panel.  For his abdominal pain it has resolved no further work-up may be necessary but will monitor and if symptoms return consider HIDA scan    Return in about 6 months (around 01/08/2022).       Subjective   SUBJECTIVE/OBJECTIVE:  Other  Pertinent negatives include no abdominal pain, chest pain, congestion, coughing, fatigue, joint swelling or sore throat.    Patient is here for follow-up blood pressure and abdominal pain he has not had any abdominal pain since he had an ultrasound performed.  He is states he is feeling pretty good.  We reviewed the previous note.  He is also doing very well on his blood pressure med  Prior to Admission medications    Medication Sig Start Date End Date Taking? Authorizing Provider   fluticasone (FLONASE) 50 MCG/ACT nasal spray 1 spray by Each Nostril route daily    Historical Provider, MD   omeprazole (PRILOSEC) 20 MG delayed release capsule Take 1 capsule by mouth daily 06/06/21   Alexis Goodell., MD   rosuvastatin (CRESTOR) 20 MG tablet 1 tablet Orally Once a day 06/06/21   Alexis Goodell., MD   carvedilol (COREG) 6.25 MG tablet as directed Orally twice a day 06/06/21   Alexis Goodell., MD   nitroGLYCERIN (NITROSTAT) 0.4 MG SL tablet as directed Sublingual 1 tab under tongue q 5 minutes x 3 doses chest pain doe not help call 911 06/06/21   Alexis Goodell., MD   Semaglutide, 2 MG/DOSE, (OZEMPIC, 2 MG/DOSE,) 8 MG/3ML SOPN Inject 2 mg into the skin once a week 06/06/21   Alexis Goodell., MD   losartan (COZAAR) 100 MG tablet 1 tablet Orally once a day 06/06/21   Alexis Goodell., MD   aspirin 81 MG EC tablet Take 1 tablet by mouth daily    Historical Provider, MD   Coenzyme Q10 (COQ10 PO) Take by mouth    Historical Provider, MD   diphenhydrAMINE-APAP, sleep, (TYLENOL PM EXTRA STRENGTH) 25-500 MG tablet 1 tablet at bedtime as needed Orally Once a day for 30 day(s)    [facility] Automatic Reconciliation, MD   Latanoprost (XELPROS) 0.005 % EMUL 1 null into affected eye in the evening Ophthalmic Once a day    [facility] Automatic Reconciliation, MD       No Known Allergies    Past Medical History:   Diagnosis Date    Eye pressure     GERD (gastroesophageal reflux disease)     Heart attack ([place])     Hyperlipidemia     Hypertension     Prediabetes        Past Surgical History:   Procedure Laterality Date    BACK SURGERY      CAROTID STENT  KNEE CARTILAGE SURGERY      TONSILLECTOMY           Family History   Problem Relation Age of Onset    Alzheimer's Disease Mother         TIA    Heart Failure Father         DM    Heart Failure Maternal Grandmother     No Known Problems Maternal Grandfather     No Known Problems Paternal Grandmother     No Known Problems Paternal Grandfather        Social History     Tobacco Use    Smoking status: Former     Types: Cigarettes     Quit date: 1984     Years since quitting: 39.4    Smokeless tobacco: Never          Review of Systems   Constitutional:  Negative for appetite change, fatigue and unexpected weight change.   HENT:  Negative for congestion, hearing loss and sore throat.    Eyes:  Negative for pain and visual disturbance.   Respiratory:  Negative for cough, shortness of breath and wheezing.    Cardiovascular:  Negative for chest pain, palpitations and leg swelling.   Gastrointestinal:  Negative for abdominal distention,  abdominal pain and blood in stool.   Genitourinary:  Negative for difficulty urinating, dysuria, frequency and hematuria.   Musculoskeletal:  Negative for joint swelling.   Neurological:  Negative for syncope.   Hematological:  Negative for adenopathy.      Objective       Vitals:    07/08/21 1539   BP: 132/64   Site: Left Upper Arm   Position: Sitting   Pulse: 69   Temp: 98.4 F (36.9 C)   SpO2: 95%   Weight: 271 lb 3.2 oz (123 kg)        BP Readings from Last 3 Encounters:   07/08/21 132/64   06/26/21 (!) 140/74   06/06/21 (!) 142/72     Wt Readings from Last 3 Encounters:   07/08/21 271 lb 3.2 oz (123 kg)   06/26/21 272 lb (123.4 kg)   06/06/21 271 lb (122.9 kg)       Physical Exam  Vitals and nursing note reviewed.   Constitutional:       General: He is not in acute distress.  HENT:      Head: Normocephalic and atraumatic.   Cardiovascular:      Rate and Rhythm: Normal rate and regular rhythm.      Pulses: Normal pulses.      Heart sounds: Normal heart sounds. No murmur heard.  Pulmonary:      Breath sounds: Normal breath sounds.   Abdominal:      General: Abdomen is flat. Bowel sounds are normal.      Palpations: Abdomen is soft.   Neurological:      General: No focal deficit present.      Mental Status: He is alert and oriented to person, place, and time.              Orders Only on 06/04/2021   Component Date Value Ref Range Status    Hemoglobin A1C 06/04/2021 5.6  4.0 - 6.0 % Final    Comment: HEMOGLOBIN A1C INTERPRETATION:    The following arbitrary ranges may be used for interpretation of the results.  However, factors such as duration of diabetes, adherence to therapy, and  patient age should also  be considered in assessing degree of blood glucose  control.    Hemoglobin A1C                 Avg. Blood Sugar  --------------------------------------------------------------  6%                           135 mg/dL  7%                           170 mg/dL  8%                           205 mg/dL  9%                            240 mg/dL  10%                          275 mg/dL    ======================================================    A1C                      Glucose Control  ----------------------------------------------------------------  < 6.0 %                   Normal  6.0 - 6.9 %               Abnormal  7.0 - 7.9 %               Sub-Optimal Control  > 8.0 %                   Inadequate Control      Est. Avg. Glucose, WB 06/04/2021 114   Final    Est. Avg. Glucose-calculated 06/04/2021 122   Final    TSH 06/04/2021 2.390  0.358 - 3.740 mcIU/mL Final    Comment: TSH INTERPRETATION:    Controversy exists over an acceptable TSH range. Some experts argue that a  narrower reference range is better and will increase the detection of thyroid  disease, particularly marginal hypothyroidism.      Color, UA 06/04/2021 Yellow   Final    Appearance 06/04/2021 Clear   Final    Glucose, UA 06/04/2021 Negative  Negative Final    Bilirubin Urine 06/04/2021 Negative  Negative Final    Ketones, Urine 06/04/2021 Negative  Negative Final    Specific Gravity, UA 06/04/2021 1.027  1.003 - 1.035 Final    Blood, Urine 06/04/2021 Negative  Negative Final    pH, UA 06/04/2021 6.0  4.5 - 8.0 Final    Protein, UA 06/04/2021 Negative  Negative Final    Urobilinogen, Urine 06/04/2021 1.0  1.0 EU/dL Final    Nitrite, Urine 06/04/2021 Negative  Negative Final    Leukocyte Esterase, Urine 06/04/2021 Negative  Negative Final    Screening PSA 06/04/2021 1.190  0.000 - 4.000 ng/mL Final    Comment: PSA INTERPRETATION:    PSA measured by Roche [employer] electrochemiluminescence immunoassay "ECLIA"  methodology.    At this time, no major scientific/medical organization including the [place] and the American Urological Association have specific  recommendations in regard to prostate specific antigen (PSA) testing other  than recommending that men at age 43 and above and those who are at high risk  at age 67-45 and above  be counseled in regard to  the pros and cons of PSA  testing.    Guidelines for risk stratification    1.  Below 1:  Very low risk of prostate cancer  2.  1-4:       Approximately 15% risk of prostate cancer  3.  4-10:      25% risk of prostate cancer  4.  >10:       50% risk of prostate cancer    The use of PSA velocity (rate of rise of PSA over time) may also be useful in  predicting the risk of prostate cancer.  Most authorities recommend PSA  testing on at least three occasions over a period of 18 months to get an  accurate PSA velocity.    References                            available by request.      Cholesterol 06/04/2021 141  100 - 200 mg/dL Final    Comment: The National Cholesterol Education Program has published reference  cholesterol values for cardiovascular risk to be:    Less than 200 mg/dL     = Low Risk    200 to 239 mg/dL        = Borderline Risk    219m/dL and greater    = High Risk      HDL 06/04/2021 59  >=40 mg/dL Final    Comment: The National Lipid Association and the [employer]olesterol Education Program  (NCEP) have set the guidelines for high-density lipoprotein (HDL) cholesterol  in adults ages 111and up.      Triglycerides 06/04/2021 119  0 - 149 mg/dL Final    Comment:   TRIGLYCERIDE INTERPRETATION:                          Recommended Fasting Triglyc Levels for Adults                        =============================================                        Desirable                        < 150 mg/dL                        Average                          < 200 mg/dL                        Borderline High             200 to 500 mg/dL                        Hypertriglyceridemic             > 500 mg/dL                        =============================================      LDL Cholesterol 06/04/2021 58.2  0.0 - 100.0 mg/dL Final    LDL/HDL Ratio 06/04/2021 1.0   Final    Chol/HDL Ratio  06/04/2021 2.4  0.0 - 4.4 Final    VLDL 06/04/2021 23.8  5.0 - 40.0 mg/dL Final    Sodium 06/04/2021 141  135 - 145 mmol/L  Final    Potassium 06/04/2021 4.7  3.5 - 5.3 mmol/L Final    Chloride 06/04/2021 104  98 - 107 mmol/L Final    CO2 06/04/2021 27  22 - 29 mmol/L Final    Glucose 06/04/2021 96  70 - 99 mg/dL Final    BUN 06/04/2021 20  8 - 23 mg/dL Final    Creatinine 06/04/2021 1.0  0.7 - 1.3 mg/dL Final    Anion Gap 06/04/2021 10  2 - 17 mmol/L Final    OSMOLALITY CALCULATED 06/04/2021 284  270 - 287 mOsm/kg Final    Calcium 06/04/2021 9.3  8.8 - 10.2 mg/dL Final    Total Protein 06/04/2021 6.4  6.4 - 8.3 g/dL Final    Albumin 06/04/2021 4.2  3.5 - 5.2 g/dL Final    Globulin 06/04/2021 2.2  1.9 - 4.4 g/dL Final    Albumin/Globulin Ratio 06/04/2021 1.90  1.00 - 2.70 Final    Total Bilirubin 06/04/2021 0.40  0.00 - 1.20 mg/dL Final    Alk Phosphatase 06/04/2021 49  40 - 130 unit/L Final    AST 06/04/2021 19  0 - 50 unit/L Final    ALT 06/04/2021 15  0 - 50 unit/L Final    Est, Glom Filt Rate 06/04/2021 80  >=60 mL/min/1.9mFinal    Comment: VERIFIED by Discern Expert.  GFR Interpretation:                                                                         % OF  KIDNEY  GFR                                                        STAGE  FUNCTION  ==================================================================================    > 90        Normal kidney function                       STAGE 1  90-100%  89 to 60      Mild loss of kidney function                 STAGE 2  80-60%  59 to 45      Mild to moderate loss of kidney function     STAGE 3a  59-45%  44 to 30      Moderate to severe loss of kidney function   STAGE 3b  44-30%  29 to 15      Severe loss of kidney function               STAGE 4  29-15%    < 15        Kidney failure  STAGE 5  <15%  ==================================================================================  Modified from [place]    GFR Calculation performed using the CKD-EPI 2021 equation developed for use  with IDMS traceable creatinine methods and                             is the calculation recommended by  the [facility] for estimating GFR in adults.      WBC 06/04/2021 5.4  3.8 - 10.6 x10e3/mcL Final    RBC 06/04/2021 4.23  4.00 - 5.60 x10e6/mcL Final    Hemoglobin 06/04/2021 12.9 (L)  13.0 - 17.3 g/dL Final    Hematocrit 06/04/2021 38.4  38.0 - 52.0 % Final    MCV 06/04/2021 90.8  84.0 - 100.0 fL Final    MCH 06/04/2021 30.5  27.0 - 34.5 pg Final    MCHC 06/04/2021 33.6  32.0 - 36.0 g/dL Final    RDW 06/04/2021 12.3  11.0 - 16.0 % Final    Platelets 06/04/2021 180  140 - 440 x10e3/mcL Final    MPV 06/04/2021 11.5  7.2 - 13.2 fL Final    NRBC Automated 06/04/2021 0.0  0.0 - 0.2 % Final    NRBC Absolute 06/04/2021 0.000  0.000 - 0.012 x10e3/mcL Final    Neutrophils % 06/04/2021 68.0  42.0 - 74.0 % Final    Lymphocytes 06/04/2021 21.7  15.0 - 45.0 % Final    Monocytes 06/04/2021 8.1  4.0 - 12.0 % Final    Eosinophils % 06/04/2021 1.1  0.0 - 7.0 % Final    Basophils % 06/04/2021 0.7  0.0 - 2.0 % Final    Neutrophils Absolute 06/04/2021 3.7  1.6 - 7.3 x10e3/mcL Final    Absolute Lymph # 06/04/2021 1.2  1.0 - 3.2 x10e3/mcL Final    Absolute Mono # 06/04/2021 0.4  0.3 - 1.0 x10e3/mcL Final    Absolute Eos # 06/04/2021 0.1  0.0 - 0.5 x10e3/mcL Final    Absolute Baso # 06/04/2021 0.0  0.0 - 0.2 x10e3/mcL Final    Immature Granulocytes 06/04/2021 0.4  0.0 - 0.6 % Final    Immature Grans (Abs) 06/04/2021 0.02  0.00 - 0.06 x10e3/mcL Final      No results found for this visit on 07/08/21.              An electronic signature was used to authenticate this note.    --Alexis Goodell, MD

## 2021-08-12 ENCOUNTER — Encounter

## 2021-08-13 NOTE — H&P (Signed)
[facility], [place]  [phone]        William [name], William [name]  DOB: 03-17-1950  [address]  [place], [place]  [address]    Date of Service: Jul 03, 2021  Current Medications Prior to Visit  nitroglycerin 0.4 mg sublingual tablet, tablet, sublingual sublingual, 30 days.    CoQ-10 100 mg capsule, Take 2 Capsule(s) Oral every day.    losartan 100 mg tablet, Take 1 Tablet(s) Oral every day.    omeprazole 20 mg tablet,delayed release, Take 1 Tablet(s) Oral every day.    Tylenol Extra Strength 500 mg tablet, Take 1 Tablet(s) Oral every day as needed.    carvedilol 6.25 mg tablet, Take 1 Tablet(s) Oral two times a day.    Flonase Allergy Relief nasal  rosuvastatin 20 mg tablet, Take 1 Tablet(s) every day, 30 days. Completed Rx.    latanoprost 0.005 % eye drops, Drop(s).    Ozempic 2 mg/dose (8 mg/3 mL) subcutaneous pen injector, Milliliter(s) Subcutaneous.        Diagnosis/Problem History  Chest pain, Active  Coronary artery disease, Active  Family history of ischemic heart disease, Active  Fatigue, Active  Former smoker, Active  History of coronary artery stent placement, Active  History of non-ST elevation myocardial infarction (NSTEMI), Active  Hyperlipidemia, Active  Hypertension, Active  Ischemic cardiomyopathy, Active  Obesity, Active  Pure hypercholesterolemia, unspecified, Active    Cardiovascular Procedures   Nuclear Stress test 11/08/2018 Exercise stress nuclear study is normal.Normal SPECT perfusion imaging. Normal SPECT perfusion imaging with artifact:  diaphragmatic attenuation.Normal left ventricular systolic function.Low risk of hemodynamically significant coronary artery  disease.  CAD  HTN  HPL  01/2018 ACS with NSTEMI  01/2018 LHC-70% complex LAD/diagonal lesion which was heavily calcified and 95% OM2 lesion, DES to OM2, LVEF 40-45%, mild elevation of LVEDP    Echo 07/2018: Normal left ventricular size and systolic function.  Low-normal left ventricular systolic function.  Normal right  ventricular size and function.  Moderate left atrial enlargement.  No hemodynamically significant valvular disease.  Normal estimated RVSP.     NST 11/08/2018: Normal    Family History  Relationship:  Mother  Disease:  Cardiovascular disease; Coronary artery bypass surgery; Hypertension; Hypercholesterolemia  Recorded Date:  February 24, 2018    Social History  Tobacco history Never smoker  Exercise occasionally exercises  Alcohol history Rarely drinks alcohol  Marital status   Employment Retired  Has the patient ever used illegal drugs? Has never used illegal drugs  Number of children 4    Allergies  NO KNOWN ALLERGIES   NO KNOWN ALLERGIES  Chief Complaint/History of Present Illness    Patient is a 71 year old male with history of CAD, ischemic cardiomyopathy, hypertension, diabetes, and hyperlipidemia who presents for follow up. He was recently on a cruise, about 1 hour after dinner had 10/10 pain lasted 6 hours, eased off was able to sleep. He doesn't think he ate anything out of the ordinary. This pain was lower then the pain that he felt during is heart attack in the past.  Saw Dr Phylliss Bob PCP after this, who sent him for an abdominal US. There was concern this was gallbladder. Abdominal US was normal. He has been referred to GI.  He does not do much for exercise/activity works in the garden without CP or SOB. Patient denied dyspnea, orthopnea/PND, LE edema, palpitations, dizziness/lightheadedness, presyncope/syncope.    Review of Systems  Constitutional:  The patient denied chills, fatigue, night sweats, weight gain/obesity and  weight loss.  Eyes:  The patient denied vision change.  Ears/Nose/Throat/Neck:  The patient denied dizziness, lightheadedness, nosebleed and sleep apnea (loud snoring).  Cardiovascular:  The patient denied chest pain/pressure, claudication (leg pain with walking), dyspnea (SOB), edema (swelling), exercise intolerance, orthopnea, palpitations (racing heart), PND (sob and coughing at night) and  syncope (passing out).  Respiratory:  The patient denied cough, hemoptysis (bloody cough) and wheezing.  Gastrointestinal:  The patient denied abdominal pain, acid reflux/indigestion, constipation, diarrhea, dyspepsia, hematemesis, hematochezia, melena (black/tarry stools) and nausea/vomitting.  Genitourinary/Nephrology:  The patient denied dysuria, hematuria, nocturia and urinary frequency.  Musculoskeletal:  The patient denied arthralgia(s), back pain and myalgias.  Dermatologic:  The patient denied rash.  Neurologic:  The patient denied numbness, paresthesia (tingling), speech difficulties, stroke symptoms and unilateral weakness (weakness on one-side).  Endocrine:  The patient denied polydyspia (excessive thirst) and polyuria (excessive urination).  Hematologic/Lymphatic:  The patient denied abnormal bleeding and bruising.    Vital Signs    Wt:270 lbs  Ht:6' 4''  BMI:32.9 HR:70 bpm  BP:148/80 mmHg  Resp:16 bpm SPO2:98 %   Physical Exam    Constitutional:  general appearance- overall: no apparent distress  Neck:  jugular veins- overall: no JVD  Respiratory:  auscultation- overall: breath sounds clear and equal bilaterally, no rales; respiratory effort/rhythm; overall: non-labored  Cardiovascular:  auscultation of heart- overall: no gallop, no murmur and regular rate and rhythm; extremities; overall: no edema  Abdomen:  abdominal exam- overall: non-tender, non-distended and bowel sounds normal             Diagnosis   I25.10-414.00 Coronary artery disease   I25.2-412 History of non-ST elevation myocardial infarction (NSTEMI)   I25.5-414.8 Ischemic cardiomyopathy   I10-401.9 Hypertension   E78.5-272.4 Hyperlipidemia   R07.9-786.50 Chest pain       Assessment/Plan  1. Chest pain: He was recently on a cruise, about 1 hour after dinner had 10/10 pain lasted 6 hours, eased off was able to sleep. PCP ordered abdominal US, negative. This is quite atypical for cardiac chest pain, but has risk factors for progression of CAD  (DM, HTN, HPL, etc.) Will update a nuclear stress test and echocardiogram to evaluate, esp given his history.   2. CAD: Updating NST as above. He is not very active so unclear functional status.   3. Ischemic cardiomyopathy: Will update echocardiogram. Appears euvolemic.   4. DM: mgmt per PCP.   5. HPL: request labs from PCP    Patient seen by [facility] with Dr Dorcas Mcmurray onsite and available  A return visit is indicated in 4 weeks.  He was advised to be on a Heart Healthy diet.    Services Performed   93000 EKG procedure  [id number] LEVEL 4/ MODERATE - EST.    Services Ordered  93306 ECHO/DOP   BubbleStudy: No    Nuke Stress Nuclear Stress Test Order   Stress Technique: Treadmill  Stress Test   Imaging Protocol: One-Day, Rest and Stress      Patient Instructions  Schedule echocardiogram.  Schedule nuclear stress test.  Will request labs from Dr. Renaldo Reel office  Return in 4-6 weeks for followup to review results.            Verlon Setting PA-C    CC:   Cherlynn Perches

## 2021-08-14 LAB — BASIC METABOLIC PANEL
Anion Gap: 10 mmol/L (ref 2–17)
BUN: 18 mg/dL (ref 8–23)
CO2: 24 mmol/L (ref 22–29)
Calcium: 9.5 mg/dL (ref 8.8–10.2)
Chloride: 104 mmol/L (ref 98–107)
Creatinine: 0.9 mg/dL (ref 0.7–1.3)
Est, Glom Filt Rate: 91 mL/min/1.73m (ref >=60)
Glucose: 116 mg/dL — ABNORMAL HIGH (ref 70–99)
OSMOLALITY CALCULATED: 278 mOsm/kg (ref 270–287)
Potassium: 4.1 mmol/L (ref 3.5–5.3)
Sodium: 138 mmol/L (ref 135–145)

## 2021-08-14 LAB — CBC
Hematocrit: 37 % — ABNORMAL LOW (ref 38.0–52.0)
Hemoglobin: 12.3 g/dL — ABNORMAL LOW (ref 13.0–17.3)
MCH: 30.4 pg (ref 27.0–34.5)
MCHC: 33.2 g/dL (ref 32.0–36.0)
MCV: 91.6 fL (ref 84.0–100.0)
MPV: 11.4 fL (ref 7.2–13.2)
NRBC Absolute: 0 10*3/uL (ref 0.000–0.012)
NRBC Automated: 0 % (ref 0.0–0.2)
Platelets: 175 10*3/uL (ref 140–440)
RBC: 4.04 x10e6/mcL (ref 4.00–5.60)
RDW: 12.3 % (ref 11.0–16.0)
WBC: 7.3 10*3/uL (ref 3.8–10.6)

## 2021-08-14 LAB — PROTIME-INR
INR: 1.1 — ABNORMAL LOW (ref 1.5–3.5)
Protime: 13.8 seconds (ref 11.6–14.5)

## 2021-08-21 ENCOUNTER — Observation Stay
Admission: EM | Admit: 2021-08-21 | Discharge: 2021-08-22 | Disposition: A | Discharge status: Home / Self Care | Payer: Medicare Other | Admitting: Internal Medicine

## 2021-08-21 DIAGNOSIS — I25119 Atherosclerotic heart disease of native coronary artery with unspecified angina pectoris: Secondary | ICD-10-CM

## 2021-08-21 LAB — POCT ACT-K
Coagulation time, activated, POC: 251 seconds — ABNORMAL HIGH (ref 100–141)
Coagulation time, activated, POC: 251 seconds — ABNORMAL HIGH (ref 100–141)
Coagulation time, activated, POC: 275 seconds — ABNORMAL HIGH (ref 100–141)

## 2021-08-21 MED ORDER — DIPHENHYDRAMINE HCL 25 MG PO CAPS
25 MG | Freq: Every evening | ORAL | Status: AC | PRN
Start: 2021-08-21 — End: 2021-08-22
  Administered 2021-08-22: 01:00:00 50 mg via ORAL

## 2021-08-21 MED ORDER — TICAGRELOR 90 MG PO TABS
90 MG | ORAL | Status: DC | PRN
Start: 2021-08-21 — End: 2021-08-21
  Administered 2021-08-21: 17:00:00 180 via ORAL

## 2021-08-21 MED ORDER — NORMAL SALINE FLUSH 0.9 % IV SOLN
0.9 % | INTRAVENOUS | Status: DC | PRN
Start: 2021-08-21 — End: 2021-08-21

## 2021-08-21 MED ORDER — NITROGLYCERIN IN D5W 200-5 MCG/ML-% IV SOLN
200-5 MCG/ML-% | INTRAVENOUS | Status: AC
Start: 2021-08-21 — End: ?

## 2021-08-21 MED ORDER — NORMAL SALINE FLUSH 0.9 % IV SOLN
0.9 % | Freq: Two times a day (BID) | INTRAVENOUS | Status: AC
Start: 2021-08-21 — End: 2021-08-22
  Administered 2021-08-22 (×2): 10 mL via INTRAVENOUS

## 2021-08-21 MED ORDER — ASPIRIN 81 MG PO CHEW
81 MG | ORAL | Status: AC
Start: 2021-08-21 — End: ?

## 2021-08-21 MED ORDER — FENTANYL CITRATE (PF) 100 MCG/2ML IJ SOLN
100 MCG/2ML | INTRAMUSCULAR | Status: DC | PRN
Start: 2021-08-21 — End: 2021-08-21
  Administered 2021-08-21: 17:00:00 25 via INTRAVENOUS
  Administered 2021-08-21: 18:00:00 50 via INTRAVENOUS

## 2021-08-21 MED ORDER — HEPARIN SODIUM (PORCINE) 1000 UNIT/ML IJ SOLN
1000 UNIT/ML | INTRAMUSCULAR | Status: DC | PRN
Start: 2021-08-21 — End: 2021-08-21
  Administered 2021-08-21: 19:00:00 1000 via INTRAVENOUS
  Administered 2021-08-21: 18:00:00 2000 via INTRAVENOUS
  Administered 2021-08-21: 17:00:00 6000 via INTRAVENOUS
  Administered 2021-08-21: 17:00:00 5000 via INTRAVENOUS
  Administered 2021-08-21: 18:00:00 2000 via INTRAVENOUS

## 2021-08-21 MED ORDER — HEPARIN SODIUM (PORCINE) 1000 UNIT/ML IJ SOLN
1000 UNIT/ML | INTRAMUSCULAR | Status: AC
Start: 2021-08-21 — End: ?

## 2021-08-21 MED ORDER — MIDAZOLAM HCL 2 MG/2ML IJ SOLN
2 MG/ML | INTRAMUSCULAR | Status: DC | PRN
Start: 2021-08-21 — End: 2021-08-21
  Administered 2021-08-21: 18:00:00 1 via INTRAVENOUS
  Administered 2021-08-21: 17:00:00 2 via INTRAVENOUS

## 2021-08-21 MED ORDER — CARVEDILOL 6.25 MG PO TABS
6.25 MG | Freq: Two times a day (BID) | ORAL | Status: AC
Start: 2021-08-21 — End: 2021-08-22
  Administered 2021-08-22: 13:00:00 6.25 mg via ORAL

## 2021-08-21 MED ORDER — ASPIRIN 81 MG PO CHEW
81 MG | Freq: Every day | ORAL | Status: AC
Start: 2021-08-21 — End: 2021-08-22
  Administered 2021-08-22: 13:00:00 81 mg via ORAL

## 2021-08-21 MED ORDER — LIDOCAINE HCL 1 % IJ SOLN
1 % | INTRAMUSCULAR | Status: AC
Start: 2021-08-21 — End: ?

## 2021-08-21 MED ORDER — ROSUVASTATIN CALCIUM 20 MG PO TABS
20 MG | Freq: Every evening | ORAL | Status: AC
Start: 2021-08-21 — End: 2021-08-22
  Administered 2021-08-22: 01:00:00 20 mg via ORAL

## 2021-08-21 MED ORDER — NORMAL SALINE FLUSH 0.9 % IV SOLN
0.9 % | INTRAVENOUS | Status: AC | PRN
Start: 2021-08-21 — End: 2021-08-22

## 2021-08-21 MED ORDER — IOPAMIDOL 76 % IV SOLN
76 % | INTRAVENOUS | Status: DC | PRN
Start: 2021-08-21 — End: 2021-08-21
  Administered 2021-08-21: 19:00:00 200 via INTRA_ARTERIAL

## 2021-08-21 MED ORDER — VERAPAMIL HCL 2.5 MG/ML IV SOLN
2.5 MG/ML | INTRAVENOUS | Status: AC
Start: 2021-08-21 — End: ?

## 2021-08-21 MED ORDER — MIDAZOLAM 5 MG/5ML INJ (NDG ONLY)
5 MG/ML | INTRAMUSCULAR | Status: DC | PRN
Start: 2021-08-21 — End: 2021-08-21
  Administered 2021-08-21 (×2): 1 via INTRAVENOUS

## 2021-08-21 MED ORDER — TICAGRELOR 90 MG PO TABS
90 MG | ORAL | Status: AC
Start: 2021-08-21 — End: ?

## 2021-08-21 MED ORDER — ACETAMINOPHEN 500 MG PO TABS
500 | Freq: Every evening | ORAL | Status: DC | PRN
Start: 2021-08-21 — End: 2021-08-22
  Administered 2021-08-22: 01:00:00 1000 mg via ORAL

## 2021-08-21 MED ORDER — NITROGLYCERIN IN D5W 200-5 MCG/ML-% IV SOLN
200-5 MCG/ML-% | INTRAVENOUS | Status: AC | PRN
Start: 2021-08-21 — End: 2021-08-21
  Administered 2021-08-21: 19:00:00 200 via INTRA_ARTERIAL
  Administered 2021-08-21: 18:00:00 200 via INTRAVENOUS

## 2021-08-21 MED ORDER — FENTANYL 0.05 MG/ML SOLN (MIXTURES ONLY)
Status: DC | PRN
Start: 2021-08-21 — End: 2021-08-21
  Administered 2021-08-21: 18:00:00 25 via INTRAVENOUS

## 2021-08-21 MED ORDER — MIDAZOLAM HCL 5 MG/5ML IJ SOLN
5 MG/ML | INTRAMUSCULAR | Status: AC
Start: 2021-08-21 — End: ?

## 2021-08-21 MED ORDER — FENTANYL CITRATE (PF) 100 MCG/2ML IJ SOLN
100 MCG/2ML | INTRAMUSCULAR | Status: AC
Start: 2021-08-21 — End: ?

## 2021-08-21 MED ORDER — SODIUM CHLORIDE 0.9 % IV SOLN
0.9 % | INTRAVENOUS | Status: AC | PRN
Start: 2021-08-21 — End: 2021-08-22

## 2021-08-21 MED ORDER — VERAPAMIL HCL 2.5 MG/ML IV SOLN
2.5 MG/ML | INTRAVENOUS | Status: DC | PRN
Start: 2021-08-21 — End: 2021-08-21
  Administered 2021-08-21: 17:00:00 5 via INTRA_ARTERIAL

## 2021-08-21 MED ORDER — SODIUM CHLORIDE 0.9 % IV SOLN
0.9 % | INTRAVENOUS | Status: DC | PRN
Start: 2021-08-21 — End: 2021-08-21
  Administered 2021-08-21: 15:00:00 via INTRAVENOUS

## 2021-08-21 MED ORDER — ACETAMINOPHEN 325 MG PO TABS
325 | ORAL | Status: DC | PRN
Start: 2021-08-21 — End: 2021-08-22

## 2021-08-21 MED ORDER — ASPIRIN 81 MG PO CHEW
81 MG | ORAL | Status: DC | PRN
Start: 2021-08-21 — End: 2021-08-21
  Administered 2021-08-21: 17:00:00 81 via ORAL

## 2021-08-21 MED ORDER — NORMAL SALINE FLUSH 0.9 % IV SOLN
0.9 % | Freq: Two times a day (BID) | INTRAVENOUS | Status: DC
Start: 2021-08-21 — End: 2021-08-21

## 2021-08-21 MED ORDER — TICAGRELOR 90 MG PO TABS
90 MG | Freq: Two times a day (BID) | ORAL | Status: AC
Start: 2021-08-21 — End: 2021-08-22
  Administered 2021-08-22 (×2): 90 mg via ORAL

## 2021-08-21 MED FILL — LIDOCAINE HCL 1 % IJ SOLN: 1 % | INTRAMUSCULAR | Qty: 20

## 2021-08-21 MED FILL — FENTANYL CITRATE (PF) 100 MCG/2ML IJ SOLN: 100 MCG/2ML | INTRAMUSCULAR | Qty: 2

## 2021-08-21 MED FILL — HEPARIN SODIUM (PORCINE) 1000 UNIT/ML IJ SOLN: 1000 UNIT/ML | INTRAMUSCULAR | Qty: 10

## 2021-08-21 MED FILL — BRILINTA 90 MG PO TABS: 90 MG | ORAL | Qty: 2

## 2021-08-21 MED FILL — ASPIRIN 81 MG PO CHEW: 81 MG | ORAL | Qty: 1

## 2021-08-21 MED FILL — MIDAZOLAM HCL 5 MG/5ML IJ SOLN: 5 MG/ML | INTRAMUSCULAR | Qty: 5

## 2021-08-21 MED FILL — NITROGLYCERIN IN D5W 200-5 MCG/ML-% IV SOLN: 200-5 MCG/ML-% | INTRAVENOUS | Qty: 250

## 2021-08-21 MED FILL — VERAPAMIL HCL 2.5 MG/ML IV SOLN: 2.5 MG/ML | INTRAVENOUS | Qty: 2

## 2021-08-21 NOTE — H&P (Signed)
Seen and reexamined   Proceed with cath      [facility]  [id number]        Kaveon, Blatz  DOB: 1950-06-28  [address]  [place], [place]  [address]    Date of Service: Jul 03, 2021  Current Medications Prior to Visit  nitroglycerin 0.4 mg sublingual tablet, tablet, sublingual sublingual, 30 days.    CoQ-10 100 mg capsule, Take 2 Capsule(s) Oral every day.    losartan 100 mg tablet, Take 1 Tablet(s) Oral every day.    omeprazole 20 mg tablet,delayed release, Take 1 Tablet(s) Oral every day.    Tylenol Extra Strength 500 mg tablet, Take 1 Tablet(s) Oral every day as needed.    carvedilol 6.25 mg tablet, Take 1 Tablet(s) Oral two times a day.    Flonase Allergy Relief nasal  rosuvastatin 20 mg tablet, Take 1 Tablet(s) every day, 30 days. Completed Rx.    latanoprost 0.005 % eye drops, Drop(s).    Ozempic 2 mg/dose (8 mg/3 mL) subcutaneous pen injector, Milliliter(s) Subcutaneous.        Diagnosis/Problem History  Chest pain, Active  Coronary artery disease, Active  Family history of ischemic heart disease, Active  Fatigue, Active  Former smoker, Active  History of coronary artery stent placement, Active  History of non-ST elevation myocardial infarction (NSTEMI), Active  Hyperlipidemia, Active  Hypertension, Active  Ischemic cardiomyopathy, Active  Obesity, Active  Pure hypercholesterolemia, unspecified, Active    Cardiovascular Procedures   Nuclear Stress test 11/08/2018 Exercise stress nuclear study is normal.Normal SPECT perfusion imaging. Normal SPECT perfusion imaging with artifact:  diaphragmatic attenuation.Normal left ventricular systolic function.Low risk of hemodynamically significant coronary artery  disease.  CAD  HTN  HPL  01/2018 ACS with NSTEMI  01/2018 LHC-70% complex LAD/diagonal lesion which was heavily calcified and 95% OM2 lesion, DES to OM2, LVEF 40-45%, mild elevation of LVEDP    Echo 07/2018: Normal left ventricular size and systolic function.  Low-normal left  ventricular systolic function.  Normal right ventricular size and function.  Moderate left atrial enlargement.  No hemodynamically significant valvular disease.  Normal estimated RVSP.     NST 11/08/2018: Normal    Family History  Relationship:  Mother  Disease:  Cardiovascular disease; Coronary artery bypass surgery; Hypertension; Hypercholesterolemia  Recorded Date:  February 24, 2018    Social History  Tobacco history Never smoker  Exercise occasionally exercises  Alcohol history Rarely drinks alcohol  Marital status   Employment Retired  Has the patient ever used illegal drugs? Has never used illegal drugs  Number of children 4    Allergies  NO KNOWN ALLERGIES   NO KNOWN ALLERGIES  Chief Complaint/History of Present Illness    Patient is a 71 year old male with history of CAD, ischemic cardiomyopathy, hypertension, diabetes, and hyperlipidemia who presents for follow up. He was recently on a cruise, about 1 hour after dinner had 10/10 pain lasted 6 hours, eased off was able to sleep. He doesn't think he ate anything out of the ordinary. This pain was lower then the pain that he felt during is heart attack in the past.  Saw Dr Phylliss Bob PCP after this, who sent him for an abdominal US. There was concern this was gallbladder. Abdominal US was normal. He has been referred to GI.  He does not do much for exercise/activity works in the garden without CP or SOB. Patient denied dyspnea, orthopnea/PND, LE edema, palpitations, dizziness/lightheadedness, presyncope/syncope.    Review of Systems  Constitutional:  The patient denied chills, fatigue, night sweats, weight gain/obesity and weight loss.  Eyes:  The patient denied vision change.  Ears/Nose/Throat/Neck:  The patient denied dizziness, lightheadedness, nosebleed and sleep apnea (loud snoring).  Cardiovascular:  The patient denied chest pain/pressure, claudication (leg pain with walking), dyspnea (SOB), edema (swelling), exercise intolerance, orthopnea, palpitations  (racing heart), PND (sob and coughing at night) and syncope (passing out).  Respiratory:  The patient denied cough, hemoptysis (bloody cough) and wheezing.  Gastrointestinal:  The patient denied abdominal pain, acid reflux/indigestion, constipation, diarrhea, dyspepsia, hematemesis, hematochezia, melena (black/tarry stools) and nausea/vomitting.  Genitourinary/Nephrology:  The patient denied dysuria, hematuria, nocturia and urinary frequency.  Musculoskeletal:  The patient denied arthralgia(s), back pain and myalgias.  Dermatologic:  The patient denied rash.  Neurologic:  The patient denied numbness, paresthesia (tingling), speech difficulties, stroke symptoms and unilateral weakness (weakness on one-side).  Endocrine:  The patient denied polydyspia (excessive thirst) and polyuria (excessive urination).  Hematologic/Lymphatic:  The patient denied abnormal bleeding and bruising.    Vital Signs    Wt:270 lbs  Ht:6' 4''  BMI:32.9 HR:70 bpm  BP:148/80 mmHg  Resp:16 bpm SPO2:98 %   Physical Exam    Constitutional:  general appearance- overall: no apparent distress  Neck:  jugular veins- overall: no JVD  Respiratory:  auscultation- overall: breath sounds clear and equal bilaterally, no rales; respiratory effort/rhythm; overall: non-labored  Cardiovascular:  auscultation of heart- overall: no gallop, no murmur and regular rate and rhythm; extremities; overall: no edema  Abdomen:  abdominal exam- overall: non-tender, non-distended and bowel sounds normal             Diagnosis   I25.10-414.00 Coronary artery disease   I25.2-412 History of non-ST elevation myocardial infarction (NSTEMI)   I25.5-414.8 Ischemic cardiomyopathy   I10-401.9 Hypertension   E78.5-272.4 Hyperlipidemia   R07.9-786.50 Chest pain       Assessment/Plan  1. Chest pain: He was recently on a cruise, about 1 hour after dinner had 10/10 pain lasted 6 hours, eased off was able to sleep. PCP ordered abdominal US, negative. This is quite atypical for cardiac  chest pain, but has risk factors for progression of CAD (DM, HTN, HPL, etc.) Will update a nuclear stress test and echocardiogram to evaluate, esp given his history.   2. CAD: Updating NST as above. He is not very active so unclear functional status.   3. Ischemic cardiomyopathy: Will update echocardiogram. Appears euvolemic.   4. DM: mgmt per PCP.   5. HPL: request labs from PCP    Patient seen by [facility] with Dr Dorcas Mcmurray onsite and available  A return visit is indicated in 4 weeks.  He was advised to be on a Heart Healthy diet.    Services Performed   93000 EKG procedure  [id number] LEVEL 4/ MODERATE - EST.    Services Ordered  93306 ECHO/DOP   BubbleStudy: No    Nuke Stress Nuclear Stress Test Order   Stress Technique: Treadmill  Stress Test   Imaging Protocol: One-Day, Rest and Stress      Patient Instructions  Schedule echocardiogram.  Schedule nuclear stress test.  Will request labs from Dr. Renaldo Reel office  Return in 4-6 weeks for followup to review results.            Verlon Setting PA-C    CC:   Cherlynn Perches

## 2021-08-21 NOTE — Other (Signed)
Patient received already admitted patient from [facility] post cath with 2 stents placed. This patient's TR band is now off, patient's vitals are all stable. Patient is A and O x 4 with family at bedside. Patient is resting comfortably and tolerated eating fine. RN put in meds, per DR, that he will need tonight and patient will DC tomorrow morning after a bit of observation.

## 2021-08-22 LAB — CBC
Hematocrit: 33.9 % — ABNORMAL LOW (ref 38.0–52.0)
Hemoglobin: 11 g/dL — ABNORMAL LOW (ref 13.0–17.3)
MCH: 30.2 pg (ref 27.0–34.5)
MCHC: 32.4 g/dL (ref 32.0–36.0)
MCV: 93.1 fL (ref 84.0–100.0)
MPV: 11.2 fL (ref 7.2–13.2)
NRBC Absolute: 0 10*3/uL (ref 0.000–0.012)
NRBC Automated: 0 % (ref 0.0–0.2)
Platelets: 144 10*3/uL (ref 140–440)
RBC: 3.64 x10e6/mcL — ABNORMAL LOW (ref 4.00–5.60)
RDW: 12.3 % (ref 11.0–16.0)
WBC: 6.5 10*3/uL (ref 3.8–10.6)

## 2021-08-22 LAB — CARDIAC PROCEDURE: Body Surface Area: 2.55 m2

## 2021-08-22 MED ORDER — TICAGRELOR 90 MG PO TABS
90 MG | ORAL_TABLET | Freq: Two times a day (BID) | ORAL | 3 refills | Status: DC
Start: 2021-08-22 — End: 2022-07-22

## 2021-08-22 MED ORDER — ROSUVASTATIN CALCIUM 20 MG PO TABS
20 MG | ORAL_TABLET | Freq: Every evening | ORAL | 3 refills | Status: AC
Start: 2021-08-22 — End: ?

## 2021-08-22 MED FILL — BRILINTA 90 MG PO TABS: 90 MG | ORAL | Qty: 1

## 2021-08-22 MED FILL — BD POSIFLUSH 0.9 % IV SOLN: 0.9 % | INTRAVENOUS | Qty: 10

## 2021-08-22 MED FILL — ROSUVASTATIN CALCIUM 20 MG PO TABS: 20 MG | ORAL | Qty: 1

## 2021-08-22 MED FILL — DIPHENHYDRAMINE HCL 25 MG PO CAPS: 25 MG | ORAL | Qty: 2

## 2021-08-22 MED FILL — ACETAMINOPHEN EXTRA STRENGTH 500 MG PO TABS: 500 MG | ORAL | Qty: 2

## 2021-08-22 MED FILL — CARVEDILOL 6.25 MG PO TABS: 6.25 MG | ORAL | Qty: 1

## 2021-08-22 MED FILL — ASPIRIN 81 MG PO CHEW: 81 MG | ORAL | Qty: 1

## 2021-08-22 NOTE — Discharge Summary (Signed)
Discharge Summary    Date: 08/22/2021  Patient Name: William [name] [facility]    Date of Birth: 1950-06-24     Age: 71 y.o.    Admit Date: 08/21/2021  Discharge Date: 08/22/2021  Discharge Condition: Good    Admission Diagnosis  Coronary artery disease [I25.10];Abnormal nuclear stress test [R94.39];Ischemic cardiomyopathy [I25.5];Hypertension [I10];Hyperlipidemia [E78.5];Chest pain [R07.9]      Discharge Diagnosis  Principal Problem:    Chest pain  Active Problems:    Essential hypertension    Hyperlipidemia    Atherosclerotic heart disease of native coronary artery with unspecified angina pectoris    Abnormal nuclear stress test    Ischemic cardiomyopathy  Resolved Problems:    * No resolved hospital problems. [facility] Stay  Narrative of Hospital Course:  Patient presented to [facility] for elective PCI following an abnormal nuclear stress test.  Coronary angiography demonstrated patent circumflex stent as well as LAD and diagonal with heavily calcified high-grade stenosis confirmed via IVUS.  Patient underwent complex PCI with drug-eluting stent x2.  Patient tolerated procedure well.  He was monitored overnight had no complication.  He will be discharged home on Brilinta and ASA. Crestor was increased to 40mg  a day.  He will need follow-up in our office in a few weeks. A referral was made to cardiac rehab.     Consultants:  None    Surgeries/procedures Performed:  LHC:  Obstructive 2 vessel  Recent episode of chest pain at home  First pictures had less than TIMI 1 flow however this improved with repeat injections     RCA has minimal disease  Patent LCX stent  LAD and diagonal have high grade >90% stenoses that are heavily calcified on IVUS  Successful PCI with DES after non-compliant, cutting and shockwave IVL angioplasty  Normal LVEDP  Recommend DAPT x 1 year       Treatments:            Discharge Plan/Disposition:  Home    Hospital/Incidental Findings Requiring Follow Up:    Patient  Instructions:    Diet:    Activity:Activity as Tolerated  For number of days (if applicable):      Other Instructions:    Provider Follow-Up:   No follow-ups on file.     Significant Diagnostic Studies:    Recent Labs:  Admission on 08/21/2021  Body Surface Area                             Date: 08/21/2021  Value: 2.55        Ref range: m2                 Status: Final  Coagulation time, activated, POC              Date: 08/21/2021  Value: 275 (H)     Ref range: 100 - 141 seconds  Status: Final                Comment: [facility] - Cath Lab  Coagulation time, activated, POC              Date: 08/21/2021  Value: 251 (H)     Ref range: 100 - 141 seconds  Status: Final                Comment: [facility] - Cath Lab  Coagulation time, activated, POC  Date: 08/21/2021  Value: 251 (H)     Ref range: 100 - 141 seconds  Status: Final                Comment: [facility] - Cath Lab  WBC                                           Date: 08/22/2021  Value: 6.5         Ref range: 3.8 - 10.6 x10e3*  Status: Final  RBC                                           Date: 08/22/2021  Value: 3.64 (L)    Ref range: 4.00 - 5.60 x10e*  Status: Final  Hemoglobin                                    Date: 08/22/2021  Value: 11.0 (L)    Ref range: 13.0 - 17.3 g/dL   Status: Final  Hematocrit                                    Date: 08/22/2021  Value: 33.9 (L)    Ref range: 38.0 - 52.0 %      Status: Final  MCV                                           Date: 08/22/2021  Value: 93.1        Ref range: 84.0 - 100.0 fL    Status: Final  MCH                                           Date: 08/22/2021  Value: 30.2        Ref range: 27.0 - 34.5 pg     Status: Final  MCHC                                          Date: 08/22/2021  Value: 32.4        Ref range: 32.0 - 36.0 g/dL   Status: Final  RDW                                           Date: 08/22/2021  Value: 12.3        Ref range: 11.0 - 16.0 %      Status: Final  Platelets                                     Date:  08/22/2021  Value: 144  Ref range: 140 - 440 x10e3/*  Status: Final  MPV                                           Date: 08/22/2021  Value: 11.2        Ref range: 7.2 - 13.2 fL      Status: Final  NRBC Automated                                Date: 08/22/2021  Value: 0.0         Ref range: 0.0 - 0.2 %        Status: Final  NRBC Absolute                                 Date: 08/22/2021  Value: 0.000       Ref range: 0.000 - 0.012 x1*  Status: Final  ------------    Radiology last 7 days:  No results found.     @DCPENDLAB @    Discharge Medications    Current Discharge Medication List    START taking these medications    ticagrelor (BRILINTA) 90 MG TABS tablet  Take 1 tablet by mouth 2 times daily  Qty: 180 tablet Refills: 3  Associated Diagnoses:Ischemic cardiomyopathy; Coronary artery disease; Abnormal nuclear stress test; Atherosclerosis of native coronary artery of native heart with angina pectoris [facility])          Current Discharge Medication List        Current Discharge Medication List    CONTINUE these medications which have NOT CHANGED    fluticasone (FLONASE) 50 MCG/ACT nasal spray  1 spray by Each Nostril route daily    omeprazole (PRILOSEC) 20 MG delayed release capsule  Take 1 capsule by mouth daily  Qty: 90 capsule Refills: 1  Associated Diagnoses:Gastroesophageal reflux disease without esophagitis    rosuvastatin (CRESTOR) 20 MG tablet  1 tablet Orally Once a day  Qty: 90 tablet Refills: 1  Associated Diagnoses:Mixed hyperlipidemia    carvedilol (COREG) 6.25 MG tablet  as directed Orally twice a day  Qty: 180 tablet Refills: 1  Associated Diagnoses:Essential hypertension    nitroGLYCERIN (NITROSTAT) 0.4 MG SL tablet  as directed Sublingual 1 tab under tongue q 5 minutes x 3 doses chest pain doe not help call 911  Qty: 25 tablet Refills: 5  Associated Diagnoses:Type 2 diabetes mellitus without complication, without long-term current use of insulin (HCC)    Semaglutide, 2 MG/DOSE, (OZEMPIC, 2  MG/DOSE,) 8 MG/3ML SOPN  Inject 2 mg into the skin once a week  Qty: 9 mL Refills: 3  Associated Diagnoses:Type 2 diabetes mellitus without complication, without long-term current use of insulin (HCC)    losartan (COZAAR) 100 MG tablet  1 tablet Orally once a day  Qty: 90 tablet Refills: 1  Associated Diagnoses:Essential hypertension    aspirin 81 MG EC tablet  Take 1 tablet by mouth daily    Coenzyme Q10 (COQ10 PO)  Take 200 mg by mouth daily    diphenhydrAMINE-APAP, sleep, (TYLENOL PM EXTRA STRENGTH) 25-500 MG tablet  2 tablets as needed for Sleep    Latanoprost (XELPROS) 0.005 % EMUL  Place 1 drop into both eyes every evening  Current Discharge Medication List        Time Spent on Discharge:  30 minutes were spent in patient examination, evaluation, counseling as well as medication reconciliation, prescriptions for required medications, discharge plan, and follow up.    Electronically signed by Marlise Eves, PA-C on 08/22/21 at 8:59 AM EDT

## 2021-08-22 NOTE — Care Coordination-Inpatient (Signed)
08/22/21 1006   Service Assessment   Patient Orientation Alert and Oriented;Person;Place   Cognition Alert   History Provided By Patient   Primary Caregiver Self   Accompanied By/Relationship Daughter and spouse   Support Systems Spouse/Significant Other   PCP Verified by CM   Edyth Gunnels)   Last Visit to PCP Within last 3 months   Prior Functional Level Independent in ADLs/IADLs   Current Functional Level Independent in ADLs/IADLs   Can patient return to prior living arrangement Yes   Ability to make needs known: Good   Family able to assist with home care needs: Yes   Would you like for me to discuss the discharge plan with any other family members/significant others, and if so, who? Yes  Stanton Kidney -spouse)   Social/Functional History   Lives With Spouse   Type of Home House   Home Layout Two level   Home Access Stairs to enter with rails   Entrance Stairs - Number of Steps 4   Entrance Stairs - Rails Both   Receives Help From Family   ADL Assistance Independent   Psychologist, prison and probation services Yes   Mode of Scientist, product/process development   Occupation Retired   Dentist   Type of [place] Spouse/Significant Other   Current Services Prior To Admission C-pap   Potential Assistance Needed N/A   DME Ordered? No   Potential Assistance Purchasing Medications No   Services At/After Discharge   Transition of Care Consult (CM Consult) N/A   Services At/After Discharge None   Confirm Follow Up Transport Family   Condition of Participation: Discharge Planning   The Plan for Transition of Care is related to the following treatment goals: Home with family support   The Patient and/or Patient Representative was provided with a Choice of Provider? Patient   The Patient and/Or Patient Representative agree with the Discharge Plan? Yes   Freedom of Choice list was provided with basic dialogue that supports the patient's  individualized plan of care/goals, treatment preferences, and shares the quality data associated with the providers?  Yes     Pt admitted for chest pain. Pt had a heart cath and is medically stable to dc home today with no CM dc needs. Met with pt and his family bedside. Pt has a cpap and is IADLS. Pt uses [facility] on [address]. No CM dc need.

## 2021-08-22 NOTE — Care Coordination-Inpatient (Signed)
08/22/21 1004   IMM Letter   Observation Status Letter date given: 08/22/21   Observation Status Letter time given: 0958

## 2021-09-03 NOTE — Telephone Encounter (Signed)
Patient called wanting to know if labs  are needed for his 09/09/21 visit. Please advise

## 2021-09-03 NOTE — Telephone Encounter (Signed)
Pt informed

## 2021-09-04 ENCOUNTER — Encounter

## 2021-09-04 LAB — CBC WITH AUTO DIFFERENTIAL
Absolute Baso #: 0.1 10*3/uL (ref 0.0–0.2)
Absolute Baso #: 0.1 10*3/uL (ref 0.0–0.2)
Absolute Eos #: 0.1 10*3/uL (ref 0.0–0.5)
Absolute Eos #: 0.1 10*3/uL (ref 0.0–0.5)
Absolute Lymph #: 1 10*3/uL (ref 1.0–3.2)
Absolute Lymph #: 1 10*3/uL (ref 1.0–3.2)
Absolute Mono #: 0.5 10*3/uL (ref 0.3–1.0)
Absolute Mono #: 0.5 10*3/uL (ref 0.3–1.0)
Basophils %: 0.9 % (ref 0.0–2.0)
Basophils %: 0.9 % (ref 0.0–2.0)
Eosinophils %: 1.1 % (ref 0.0–7.0)
Eosinophils %: 1.1 % (ref 0.0–7.0)
Hematocrit: 35.5 % — ABNORMAL LOW (ref 38.0–52.0)
Hematocrit: 35.5 % — ABNORMAL LOW (ref 38.0–52.0)
Hemoglobin: 11.5 g/dL — ABNORMAL LOW (ref 13.0–17.3)
Hemoglobin: 11.5 g/dL — ABNORMAL LOW (ref 13.0–17.3)
Immature Grans (Abs): 0.02 10*3/uL (ref 0.00–0.06)
Immature Grans (Abs): 0.02 10*3/uL (ref 0.00–0.06)
Immature Granulocytes: 0.3 % (ref 0.0–0.6)
Immature Granulocytes: 0.3 % (ref 0.0–0.6)
Lymphocytes: 14.8 % — ABNORMAL LOW (ref 15.0–45.0)
Lymphocytes: 14.8 % — ABNORMAL LOW (ref 15.0–45.0)
MCH: 30.3 pg (ref 27.0–34.5)
MCH: 30.3 pg (ref 27.0–34.5)
MCHC: 32.4 g/dL (ref 32.0–36.0)
MCHC: 32.4 g/dL (ref 32.0–36.0)
MCV: 93.4 fL (ref 84.0–100.0)
MCV: 93.4 fL (ref 84.0–100.0)
MPV: 10.9 fL (ref 7.2–13.2)
MPV: 10.9 fL (ref 7.2–13.2)
Monocytes: 8.2 % (ref 4.0–12.0)
Monocytes: 8.2 % (ref 4.0–12.0)
NRBC Absolute: 0 10*3/uL (ref 0.000–0.012)
NRBC Absolute: 0 10*3/uL (ref 0.000–0.012)
NRBC Automated: 0 % (ref 0.0–0.2)
NRBC Automated: 0 % (ref 0.0–0.2)
Neutrophils %: 74.7 % — ABNORMAL HIGH (ref 42.0–74.0)
Neutrophils %: 74.7 % — ABNORMAL HIGH (ref 42.0–74.0)
Neutrophils Absolute: 4.8 10*3/uL (ref 1.6–7.3)
Neutrophils Absolute: 4.8 10*3/uL (ref 1.6–7.3)
Platelets: 200 10*3/uL (ref 140–440)
Platelets: 200 10*3/uL (ref 140–440)
RBC: 3.8 x10e6/mcL — ABNORMAL LOW (ref 4.00–5.60)
RBC: 3.8 x10e6/mcL — ABNORMAL LOW (ref 4.00–5.60)
RDW: 12.5 % (ref 11.0–16.0)
RDW: 12.5 % (ref 11.0–16.0)
WBC: 6.5 10*3/uL (ref 3.8–10.6)
WBC: 6.5 10*3/uL (ref 3.8–10.6)

## 2021-09-04 LAB — BASIC METABOLIC PANEL
Anion Gap: 11 mmol/L (ref 2–17)
BUN: 15 mg/dL (ref 8–23)
CO2: 25 mmol/L (ref 22–29)
Calcium: 9.2 mg/dL (ref 8.8–10.2)
Chloride: 104 mmol/L (ref 98–107)
Creatinine: 1 mg/dL (ref 0.7–1.3)
Est, Glom Filt Rate: 80 mL/min/1.73m (ref >=60)
Glucose: 103 mg/dL — ABNORMAL HIGH (ref 70–99)
OSMOLALITY CALCULATED: 280 mOsm/kg (ref 270–287)
Potassium: 4 mmol/L (ref 3.5–5.3)
Sodium: 140 mmol/L (ref 135–145)

## 2021-09-04 LAB — LIPID PANEL
Chol/HDL Ratio: 2.3 (ref 0.0–4.4)
Cholesterol: 113 mg/dL (ref 100–200)
HDL: 49 mg/dL (ref >=40)
LDL Cholesterol: 44 mg/dL (ref 0.0–100.0)
LDL/HDL Ratio: 0.9
Triglycerides: 100 mg/dL (ref 0–149)
VLDL: 20 mg/dL (ref 5.0–40.0)

## 2021-09-04 LAB — HEMOGLOBIN A1C
Est. Avg. Glucose, WB: 114
Est. Avg. Glucose-calculated: 122
Hemoglobin A1C: 5.6 % (ref 4.0–6.0)

## 2021-09-04 LAB — LIPASE: Lipase: 44 U/L (ref 13–60)

## 2021-09-09 ENCOUNTER — Ambulatory Visit: Admit: 2021-09-09 | Discharge: 2021-09-09 | Payer: MEDICARE | Attending: Family Medicine | Primary: Family Medicine

## 2021-09-09 DIAGNOSIS — F419 Anxiety disorder, unspecified: Secondary | ICD-10-CM

## 2021-09-09 DIAGNOSIS — I10 Essential (primary) hypertension: Secondary | ICD-10-CM

## 2021-09-09 MED ORDER — DULOXETINE HCL 30 MG PO CPEP
30 MG | ORAL_CAPSULE | Freq: Every day | ORAL | 0 refills | Status: DC
Start: 2021-09-09 — End: 2021-10-04

## 2021-09-09 NOTE — Progress Notes (Signed)
William [name] (DOB:  01-21-1951) is a 71 y.o. male, here for evaluation of the following chief complaint(s):  Discuss Labs (60m f/up review labs.  No refills needed)           ASSESSMENT/PLAN:  1. Anxiety  -     DULoxetine (CYMBALTA) 30 MG extended release capsule; Take 2 capsules by mouth daily, Disp-60 capsule, R-0Normal  2. Essential hypertension  3. Gastroesophageal reflux disease without esophagitis  4. Mixed hyperlipidemia  5. Type 2 diabetes mellitus without complication, without long-term current use of insulin (HCC)  6. Ischemic cardiomyopathy  Comments:  Added automatically from request for surgery [id number]  7. Coronary artery disease involving native coronary artery of native heart without angina pectoris  8. Abnormal nuclear stress test  9. Atherosclerosis of native coronary artery of native heart with angina pectoris (HCC)  10. Papules  -     Margarito Liner MD, Dermatology    We reviewed anxiety.  We will start patient back on Cymbalta.  He had previously been on Lexapro and it was not as helpful and Cymbalta seem to help a fair amount.  Hypertension is a little poorly controlled however he has an appointment with cardiology in 3 days.  Given his recent CAD and stent will let cardiology review treatments otherwise we will see patient back in 4 weeks.  Return in about 4 weeks (around 10/07/2021).       Subjective   SUBJECTIVE/OBJECTIVE:  HPI   Patient ended up having coronary artery disease diagnosed after nuclear stress test catheterization showed LAD and diagonal plaques that required stenting.  He is doing well since then no side effects with any medications he is doing with a lot of anxiety and stress.  Prior to Admission medications    Medication Sig Start Date End Date Taking? Authorizing Provider   losartan (COZAAR) 100 MG tablet Take 1 tablet by mouth daily   Yes Historical Provider, MD   DULoxetine (CYMBALTA) 30 MG extended release capsule Take 2 capsules by mouth daily 09/09/21  Yes Claybon Jabs., MD   ticagrelor [facility]) 90 MG TABS tablet Take 1 tablet by mouth 2 times daily 08/22/21  Yes Iona Coach Dalmau, PA-C   rosuvastatin (CRESTOR) 20 MG tablet Take 2 tablets by mouth nightly 1 tablet Orally Once a day 08/22/21  Yes Iona Coach Dalmau, PA-C   fluticasone (FLONASE) 50 MCG/ACT nasal spray 1 spray by Each Nostril route daily   Yes Historical Provider, MD   omeprazole (PRILOSEC) 20 MG delayed release capsule Take 1 capsule by mouth daily  Patient taking differently: Take 1 capsule by mouth Daily 06/06/21  Yes Claybon Jabs., MD   carvedilol (COREG) 6.25 MG tablet as directed Orally twice a day  Patient taking differently: Take 1 tablet by mouth 2 times daily as directed Orally twice a day 06/06/21  Yes Izzy Courville Ouida Sills., MD   nitroGLYCERIN (NITROSTAT) 0.4 MG SL tablet as directed Sublingual 1 tab under tongue q 5 minutes x 3 doses chest pain doe not help call 911  Patient taking differently: Place 1 tablet under the tongue every 5 minutes as needed for Chest pain as directed Sublingual 1 tab under tongue q 5 minutes x 3 doses chest pain doe not help call 911 06/06/21  Yes Claybon Jabs., MD   Semaglutide, 2 MG/DOSE, (OZEMPIC, 2 MG/DOSE,) 8 MG/3ML SOPN Inject 2 mg into the skin once a week 06/06/21  Yes Claybon Jabs.,  MD   aspirin 81 MG EC tablet Take 1 tablet by mouth daily   Yes Historical Provider, MD   Coenzyme Q10 (COQ10 PO) Take 200 mg by mouth daily   Yes Historical Provider, MD   diphenhydrAMINE-APAP, sleep, (TYLENOL PM EXTRA STRENGTH) 25-500 MG tablet 2 tablets as needed for Sleep   Yes [facility] Automatic Reconciliation, MD   Latanoprost (XELPROS) 0.005 % EMUL Place 1 drop into both eyes every evening   Yes [facility] Automatic Reconciliation, MD       No Known Allergies    Past Medical History:   Diagnosis Date    Eye pressure     GERD (gastroesophageal reflux disease)     Heart attack (HCC)     Hyperlipidemia     Hypertension     Prediabetes        Past Surgical History:   Procedure  Laterality Date    BACK SURGERY  1991    CARDIAC PROCEDURE N/A 08/21/2021    Left heart cath performed by Arnette Felts, MD at [facility]    CARDIAC PROCEDURE N/A 08/21/2021    Insert stent des coronary performed by Arnette Felts, MD at [facility]    CAROTID STENT  01/2018    KNEE CARTILAGE SURGERY      TONSILLECTOMY           Family History   Problem Relation Age of Onset    Alzheimer's Disease Mother         TIA    Heart Failure Father         DM    Heart Failure Maternal Grandmother     No Known Problems Maternal Grandfather     No Known Problems Paternal Grandmother     No Known Problems Paternal Grandfather        Social History     Tobacco Use    Smoking status: Former     Types: Cigarettes     Quit date: 1984     Years since quitting: 39.5    Smokeless tobacco: Never   Vaping Use    Vaping Use: Never used   Substance Use Topics    Alcohol use: Yes     Alcohol/week: 4.0 standard drinks     Types: 4 Shots of liquor per week    Drug use: Never          Review of Systems     Objective       Vitals:    09/09/21 1124   BP: (!) 142/72   Site: Left Upper Arm   Position: Sitting   Cuff Size: Large Adult   Pulse: 66   Temp: 98.2 F (36.8 C)   TempSrc: Oral   SpO2: 98%   Weight: 272 lb 6 oz (123.5 kg)   Height: 6\' 4"  (1.93 m)        BP Readings from Last 3 Encounters:   09/09/21 (!) 142/72   08/22/21 (!) 165/86   07/08/21 132/64     Wt Readings from Last 3 Encounters:   09/09/21 272 lb 6 oz (123.5 kg)   08/21/21 271 lb (122.9 kg)   07/08/21 271 lb 3.2 oz (123 kg)       Physical Exam           Orders Only on 09/04/2021   Component Date Value Ref Range Status    Cholesterol 09/04/2021 113  100 - 200 mg/dL Final  Comment: The National Cholesterol Education Program has published reference  cholesterol values for cardiovascular risk to be:    Less than 200 mg/dL     = Low Risk    329 to 239 mg/dL        = Borderline Risk    240mg /dL and greater    = High Risk      HDL 09/04/2021 49   >=40 mg/dL Final    Comment: The National Lipid Association and the 09/06/2021 Cholesterol Education Program  (NCEP) have set the guidelines for high-density lipoprotein (HDL) cholesterol  in adults ages 74 and up.      Triglycerides 09/04/2021 100  0 - 149 mg/dL Final    Comment:   TRIGLYCERIDE INTERPRETATION:                          Recommended Fasting Triglyc Levels for Adults                        =============================================                        Desirable                        < 150 mg/dL                        Average                          < 200 mg/dL                        Borderline High             200 to 500 mg/dL                        Hypertriglyceridemic             > 500 mg/dL                        =============================================      LDL Cholesterol 09/04/2021 44.0  0.0 - 100.0 mg/dL Final    LDL/HDL Ratio 09/04/2021 0.9   Final    Chol/HDL Ratio 09/04/2021 2.3  0.0 - 4.4 Final    VLDL 09/04/2021 20.0  5.0 - 40.0 mg/dL Final    Hemoglobin 09/06/2021 09/04/2021 5.6  4.0 - 6.0 % Final    Comment: HEMOGLOBIN A1C INTERPRETATION:    The following arbitrary ranges may be used for interpretation of the results.  However, factors such as duration of diabetes, adherence to therapy, and  patient age should also be considered in assessing degree of blood glucose  control.    Hemoglobin A1C                 Avg. Blood Sugar  --------------------------------------------------------------  6%                           135 mg/dL  7%                           170 mg/dL  8%  205 mg/dL  9%                           240 mg/dL  67%                          275 mg/dL    ======================================================    A1C                      Glucose Control  ----------------------------------------------------------------  < 6.0 %                   Normal  6.0 - 6.9 %               Abnormal  7.0 - 7.9 %               Sub-Optimal Control  > 8.0 %                    Inadequate Control      Est. Avg. Glucose, WB 09/04/2021 114   Final    Est. Avg. Glucose-calculated 09/04/2021 122   Final    WBC 09/04/2021 6.5  3.8 - 10.6 x10e3/mcL Final    RBC 09/04/2021 3.80 (L)  4.00 - 5.60 x10e6/mcL Final    Hemoglobin 09/04/2021 11.5 (L)  13.0 - 17.3 g/dL Final    Hematocrit 61/95/0932 35.5 (L)  38.0 - 52.0 % Final    MCV 09/04/2021 93.4  84.0 - 100.0 fL Final    MCH 09/04/2021 30.3  27.0 - 34.5 pg Final    MCHC 09/04/2021 32.4  32.0 - 36.0 g/dL Final    RDW 67/01/4579 12.5  11.0 - 16.0 % Final    Platelets 09/04/2021 200  140 - 440 x10e3/mcL Final    MPV 09/04/2021 10.9  7.2 - 13.2 fL Final    NRBC Automated 09/04/2021 0.0  0.0 - 0.2 % Final    NRBC Absolute 09/04/2021 0.000  0.000 - 0.012 x10e3/mcL Final    Neutrophils % 09/04/2021 74.7 (H)  42.0 - 74.0 % Final    Lymphocytes 09/04/2021 14.8 (L)  15.0 - 45.0 % Final    Monocytes 09/04/2021 8.2  4.0 - 12.0 % Final    Eosinophils % 09/04/2021 1.1  0.0 - 7.0 % Final    Basophils % 09/04/2021 0.9  0.0 - 2.0 % Final    Neutrophils Absolute 09/04/2021 4.8  1.6 - 7.3 x10e3/mcL Final    Absolute Lymph # 09/04/2021 1.0  1.0 - 3.2 x10e3/mcL Final    Absolute Mono # 09/04/2021 0.5  0.3 - 1.0 x10e3/mcL Final    Absolute Eos # 09/04/2021 0.1  0.0 - 0.5 x10e3/mcL Final    Absolute Baso # 09/04/2021 0.1  0.0 - 0.2 x10e3/mcL Final    Immature Granulocytes 09/04/2021 0.3  0.0 - 0.6 % Final    Immature Grans (Abs) 09/04/2021 0.02  0.00 - 0.06 x10e3/mcL Final      No results found for this visit on 09/09/21.              An electronic signature was used to authenticate this note.    --Claybon Jabs, MD

## 2021-09-19 ENCOUNTER — Ambulatory Visit: Admit: 2021-09-19 | Discharge: 2021-09-19 | Payer: MEDICARE | Attending: Medical | Primary: Family Medicine

## 2021-09-19 DIAGNOSIS — I2584 Coronary atherosclerosis due to calcified coronary lesion: Secondary | ICD-10-CM

## 2021-09-19 NOTE — Progress Notes (Signed)
Date:  September 19, 2021  Patient name: Kiefer Opheim [facility]  Date of Birth: [birth year]      REASON FOR CLINIC VISIT: Follow-up (No symptoms today)      HISTORY OF PRESENT ILLNESS          Nuno Brubacher Alomar was seen in cardiology clinic today.     Patient is a 71 y.o. male with history of coronary artery disease, ischemic cardiomyopathy with recovered EF, diabetes, hyperlipidemia, hypertension. At his last visit he was reporting an episode of chest pain. Nuclear stress test and echo were ordered. Echo 08/09/21 showed EF 50-55%, mild ectasia of ascending aortic, no hemodynamically significant valvular disease. Nuclear stress test was abnormal, which prompted cath.    He had a left heart catheterization that showed obstructive 2-vessel disease. RCA with minimal disease, patent LCX stent, LAD to diagonal have high-grade >90% stenoses that were heavily calcified on IVUS. He underwent successful PCI with DES after non-compliant, cutting and shockwave IVL angioplasty. Started on DAPT for 1 year.    Since then, he has been feeling great.  He has not had any further chest pain. He denies any chest pain, shortness of breath, lightheadedness, dizziness, palpitations, orthopnea/PND, presyncope/syncope. His only complaint today is fatigue, his energy level are not where he would like it to be.      PAST HISTORY          Past Medical History:   has a past medical history of Eye pressure, GERD (gastroesophageal reflux disease), Heart attack (HCC), Hyperlipidemia, Hypertension, and Prediabetes.    Past Surgical History:   has a past surgical history that includes Carotid stent (01/2018); back surgery (1991); Tonsillectomy; Knee cartilage surgery; Cardiac procedure (N/A, 08/21/2021); and Cardiac procedure (N/A, 08/21/2021).     Social History:   reports that he quit smoking about 39 years ago. His smoking use included cigarettes. He has a 0.13 pack-year smoking history. He has never used smokeless tobacco. He reports current alcohol  use of about 4.0 standard drinks per week. He reports that he does not use drugs.     Family History: family history includes Alzheimer's Disease in his mother; Heart Failure in his father and maternal grandmother; No Known Problems in his maternal grandfather, paternal grandfather, and paternal grandmother.    Allergies:  Patient has no known allergies.    Home Medications:    Prior to Admission medications    Medication Sig Start Date End Date Taking? Authorizing Provider   losartan (COZAAR) 100 MG tablet Take 1 tablet by mouth daily   Yes Historical Provider, MD   DULoxetine (CYMBALTA) 30 MG extended release capsule Take 2 capsules by mouth daily 09/09/21  Yes Claybon Jabs., MD   ticagrelor [facility]) 90 MG TABS tablet Take 1 tablet by mouth 2 times daily 08/22/21  Yes Iona Coach Dalmau, PA-C   rosuvastatin (CRESTOR) 20 MG tablet Take 2 tablets by mouth nightly 1 tablet Orally Once a day 08/22/21  Yes Iona Coach Dalmau, PA-C   fluticasone (FLONASE) 50 MCG/ACT nasal spray 1 spray by Each Nostril route daily   Yes Historical Provider, MD   omeprazole (PRILOSEC) 20 MG delayed release capsule Take 1 capsule by mouth daily  Patient taking differently: Take 1 capsule by mouth Daily 06/06/21  Yes Claybon Jabs., MD   nitroGLYCERIN (NITROSTAT) 0.4 MG SL tablet as directed Sublingual 1 tab under tongue q 5 minutes x 3 doses chest pain doe not help call 911  Patient taking differently:  Place 1 tablet under the tongue every 5 minutes as needed for Chest pain as directed Sublingual 1 tab under tongue q 5 minutes x 3 doses chest pain doe not help call 911 06/06/21  Yes Claybon Jabs., MD   Semaglutide, 2 MG/DOSE, (OZEMPIC, 2 MG/DOSE,) 8 MG/3ML SOPN Inject 2 mg into the skin once a week 06/06/21  Yes John Ouida Sills., MD   aspirin 81 MG EC tablet Take 1 tablet by mouth daily   Yes Historical Provider, MD   Coenzyme Q10 (COQ10 PO) Take 200 mg by mouth daily   Yes Historical Provider, MD   diphenhydrAMINE-APAP, sleep, (TYLENOL PM  EXTRA STRENGTH) 25-500 MG tablet 2 tablets as needed for Sleep   Yes [facility] Automatic Reconciliation, MD   Latanoprost (XELPROS) 0.005 % EMUL Place 1 drop into both eyes every evening   Yes [facility] Automatic Reconciliation, MD   carvedilol (COREG) 6.25 MG tablet as directed Orally twice a day  Patient taking differently: Take 1 tablet by mouth 2 times daily as directed Orally twice a day 06/06/21   Claybon Jabs., MD         REVIEW OF SYSTEMS          Review of Systems   Constitutional:  Positive for fatigue. Negative for activity change, fever and unexpected weight change.   HENT:  Negative for nosebleeds.    Eyes:  Negative for visual disturbance.   Respiratory:  Negative for cough, chest tightness, shortness of breath and wheezing.    Cardiovascular:  Negative for chest pain, palpitations and leg swelling.   Gastrointestinal:  Negative for abdominal pain, constipation, diarrhea and nausea.   Endocrine: Negative for polydipsia and polyuria.   Genitourinary:  Negative for flank pain.   Musculoskeletal:  Negative for joint swelling and myalgias.   Skin:  Negative for rash.   Neurological:  Negative for dizziness, syncope, weakness, light-headedness, numbness and headaches.   Psychiatric/Behavioral:  The patient is not nervous/anxious.        PHYSICAL EXAM            BP 132/84 (Site: Left Upper Arm, Position: Sitting, Cuff Size: Medium Adult)   Pulse 67   Resp 16   Ht 6\' 4"  (1.93 m)   Wt 267 lb 12.8 oz (121.5 kg)   SpO2 97%   BMI 32.60 kg/m      CONSTITUTIONAL:  awake, alert, and oriented, no apparent distress  LUNGS:  No increased work of breathing, good air exchange, clear to auscultation bilaterally, no crackles or wheezing  CARDIOVASCULAR:  Normal apical impulse, regular rate and rhythm, normal S1 and S2, no S3 or S4, and no murmur noted, no edema  ABDOMEN:  Soft, non-tender, non-distended   EXTREMITIES:  there is no redness, warmth, or swelling of the joints; full range of motion  noted      DIAGNOSTIC STUDIES          Electrocardiogram:  No results found for this or any previous visit (from the past 4464 hour(s)).    Echocardiogram:  Echo 08/09/21 showed EF 50-55%, mild ectasia of ascending aortic, no hemodynamically significant valvular disease.    Cardiac catheterization:  08/21/21    CARDIAC PROCEDURE 08/22/2021  8:42 AM (Final)    Conclusion  Obstructive 2 vessel  Recent episode of chest pain at home  First pictures had less than TIMI 1 flow however this improved with repeat injections    RCA has minimal disease  Patent LCX stent  LAD and diagonal have high grade >90% stenoses that are heavily calcified on IVUS    Successful PCI with DES after non-compliant, cutting and shockwave IVL angioplasty    Normal LVEDP      Recommend DAPT x 1 year    Signed by: Arnette Felts, MD on 08/22/2021  8:42 AM       Stress test:  6/23 abnormal       LABS           Lab Results   Component Value Date    CHOL 113 09/04/2021    CHOL 141 06/04/2021    CHOL 126 05/31/2020     Lab Results   Component Value Date    TRIG 100 09/04/2021    TRIG 119 06/04/2021    TRIG 64 05/31/2020     Lab Results   Component Value Date    HDL 49 09/04/2021    HDL 59 06/04/2021    HDL 58 05/31/2020     Lab Results   Component Value Date    LDLCHOLESTEROL 44.0 09/04/2021    LDLCHOLESTEROL 58.2 06/04/2021    LDLCHOLESTEROL 55.2 05/31/2020     Lab Results   Component Value Date    VLDL 20.0 09/04/2021    VLDL 23.8 06/04/2021    VLDL 12.8 05/31/2020     Lab Results   Component Value Date    CHOLHDLRATIO 2.3 09/04/2021    CHOLHDLRATIO 2.4 06/04/2021    CHOLHDLRATIO 2.2 05/31/2020          ASSESSMENT / RECOMMENDATIONS        Coronary artery disease: History of ACS with NSTEMI in 01/2018, cath at that time showed 70% complex LAD/diagonal lesion which was heavily calcified, and 95% OM2 lesion, with DES to OM2. EF was 40% at the time. He had a left heart catheterization 08/22/21 after abnormal NST ordered for chest pain, that showed  obstructive 2-vessel disease. RCA with minimal disease, patent LCX stent, LAD to diagonal have high-grade >90% stenoses that were heavily calcified on IVUS. He underwent successful PCI with DES after non-complaint, cutting and shockwave IVL angioplasty. Started on DAPT for 1 year. On brilinta and aspirin, for 1 year. Consider Xarelto 2.5 in the future. Continue DAPT, statin, bblocker. He does report fatigue since his PCI, will refer him to cardiac rehab.   Ischemic cardiomyopathy: EF 40% 01/2018 after NSTEMI. Then normalized EF 07/2018. Recent echo with normal EF. On ARB and bblocker.   Hyperlipidemia: On Crestor 20 mg once daily. MRL with LDL 44. Continue.     Verlon Setting, PA-C  [facility] / [facility]

## 2021-09-20 ENCOUNTER — Encounter

## 2021-10-04 ENCOUNTER — Encounter

## 2021-10-04 MED ORDER — DULOXETINE HCL 30 MG PO CPEP
30 MG | ORAL_CAPSULE | Freq: Every day | ORAL | 1 refills | Status: DC
Start: 2021-10-04 — End: 2021-10-15

## 2021-10-04 NOTE — Telephone Encounter (Signed)
Pls es

## 2021-10-15 ENCOUNTER — Ambulatory Visit: Admit: 2021-10-15 | Discharge: 2021-10-15 | Payer: MEDICARE | Attending: Family Medicine | Primary: Family Medicine

## 2021-10-15 DIAGNOSIS — I10 Essential (primary) hypertension: Secondary | ICD-10-CM

## 2021-10-15 DIAGNOSIS — D649 Anemia, unspecified: Secondary | ICD-10-CM

## 2021-10-15 MED ORDER — DULOXETINE HCL 30 MG PO CPEP
30 MG | ORAL_CAPSULE | Freq: Every day | ORAL | 1 refills | Status: DC
Start: 2021-10-15 — End: 2022-01-06

## 2021-10-15 NOTE — Progress Notes (Signed)
William [name] (DOB:  06/12/1950) is a 71 y.o. male, here for evaluation of the following chief complaint(s):  Medication Check (Medication f/up Pt did cut back to 1tablet on Cymbalta 2 tablets  made him feel sick.)           ASSESSMENT/PLAN:  1. Anemia, unspecified type  -     Ferritin; Future  -     Iron and TIBC; Future  -     Vitamin B12; Future  -     Folate; Future  -     CBC; Future  2. Essential hypertension  3. Anxiety  -     DULoxetine (CYMBALTA) 30 MG extended release capsule; Take 1 capsule by mouth daily, Disp-90 capsule, R-1Normal  4. Gastroesophageal reflux disease without esophagitis  5. Mixed hyperlipidemia  6. Type 2 diabetes mellitus without complication, without long-term current use of insulin (HCC)  7. Ischemic cardiomyopathy  Comments:  Added automatically from request for surgery [id number]  8. Coronary artery disease involving native coronary artery of native heart without angina pectoris  9. Abnormal nuclear stress test  10. Atherosclerosis of native coronary artery of native heart with angina pectoris (HCC)    Patient's anxiety is much better controlled on Cymbalta 30.  We discussed we could even do a Cymbalta 40 if he felt Ness necessary.  Hypertensions controlled doing well on all other aspects.  His anemia will need further follow-up with repeat CBC iron B12 and folate acid studies.  No follow-ups on file.       Subjective   SUBJECTIVE/OBJECTIVE:  HPI   Patient is here for follow-up of chronic medical problems recent anxiety initiation of treatment and blood work.  He is doing well recently got a cruise and had an excellent time.  Anxiety is much better he did not tolerate the 60 mg dose.  Prior to Admission medications    Medication Sig Start Date End Date Taking? Authorizing Provider   DULoxetine (CYMBALTA) 30 MG extended release capsule Take 1 capsule by mouth daily 10/15/21  Yes Claybon Jabs., MD   losartan (COZAAR) 100 MG tablet Take 1 tablet by mouth daily   Yes Historical  Provider, MD   ticagrelor (BRILINTA) 90 MG TABS tablet Take 1 tablet by mouth 2 times daily 08/22/21  Yes Iona Coach Dalmau, PA-C   rosuvastatin (CRESTOR) 20 MG tablet Take 2 tablets by mouth nightly 1 tablet Orally Once a day 08/22/21  Yes Iona Coach Dalmau, PA-C   fluticasone (FLONASE) 50 MCG/ACT nasal spray 1 spray by Each Nostril route daily   Yes Historical Provider, MD   omeprazole (PRILOSEC) 20 MG delayed release capsule Take 1 capsule by mouth daily  Patient taking differently: Take 1 capsule by mouth Daily 06/06/21  Yes Claybon Jabs., MD   carvedilol (COREG) 6.25 MG tablet as directed Orally twice a day  Patient taking differently: Take 1 tablet by mouth 2 times daily as directed Orally twice a day 06/06/21  Yes Lasaro Primm Ouida Sills., MD   nitroGLYCERIN (NITROSTAT) 0.4 MG SL tablet as directed Sublingual 1 tab under tongue q 5 minutes x 3 doses chest pain doe not help call 911  Patient taking differently: Place 1 tablet under the tongue every 5 minutes as needed for Chest pain as directed Sublingual 1 tab under tongue q 5 minutes x 3 doses chest pain doe not help call 911 06/06/21  Yes Claybon Jabs., MD   Semaglutide, 2 MG/DOSE, (OZEMPIC, 2  MG/DOSE,) 8 MG/3ML SOPN Inject 2 mg into the skin once a week 06/06/21  Yes Seleny Allbright Ouida Sills., MD   aspirin 81 MG EC tablet Take 1 tablet by mouth daily   Yes Historical Provider, MD   Coenzyme Q10 (COQ10 PO) Take 200 mg by mouth daily   Yes Historical Provider, MD   diphenhydrAMINE-APAP, sleep, (TYLENOL PM EXTRA STRENGTH) 25-500 MG tablet 2 tablets as needed for Sleep   Yes Rsfh Rsfh Automatic Reconciliation, MD   Latanoprost (XELPROS) 0.005 % EMUL Place 1 drop into both eyes every evening   Yes Rsfh Rsfh Automatic Reconciliation, MD       No Known Allergies    Past Medical History:   Diagnosis Date    Eye pressure     GERD (gastroesophageal reflux disease)     Heart attack (HCC)     Hyperlipidemia     Hypertension     Prediabetes        Past Surgical History:   Procedure  Laterality Date    BACK SURGERY  1991    CARDIAC PROCEDURE N/A 08/21/2021    Left heart cath performed by Arnette Felts, MD at [facility]    CARDIAC PROCEDURE N/A 08/21/2021    Insert stent des coronary performed by Arnette Felts, MD at [facility]    CAROTID STENT  01/2018    KNEE CARTILAGE SURGERY      TONSILLECTOMY           Family History   Problem Relation Age of Onset    Alzheimer's Disease Mother         TIA    Heart Failure Father         DM    Heart Failure Maternal Grandmother     No Known Problems Maternal Grandfather     No Known Problems Paternal Grandmother     No Known Problems Paternal Grandfather        Social History     Tobacco Use    Smoking status: Former     Packs/day: 0.25     Years: 0.50     Pack years: 0.13     Types: Cigarettes     Quit date: 1984     Years since quitting: 39.6    Smokeless tobacco: Never   Vaping Use    Vaping Use: Never used   Substance Use Topics    Alcohol use: Yes     Alcohol/week: 4.0 standard drinks     Types: 4 Shots of liquor per week    Drug use: Never          Review of Systems     Objective       Vitals:    10/15/21 1522   BP: 126/64   Site: Left Upper Arm   Position: Sitting   Cuff Size: Large Adult   Pulse: 88   Temp: 99 F (37.2 C)   TempSrc: Oral   SpO2: 97%   Weight: 271 lb (122.9 kg)   Height: 6\' 4"  (1.93 m)        BP Readings from Last 3 Encounters:   10/15/21 126/64   09/19/21 132/84   09/09/21 (!) 142/72     Wt Readings from Last 3 Encounters:   10/15/21 271 lb (122.9 kg)   09/19/21 267 lb 12.8 oz (121.5 kg)   09/09/21 272 lb 6 oz (123.5 kg)       Physical Exam  Orders Only on 09/04/2021   Component Date Value Ref Range Status    Cholesterol 09/04/2021 113  100 - 200 mg/dL Final    Comment: The National Cholesterol Education Program has published reference  cholesterol values for cardiovascular risk to be:    Less than 200 mg/dL     = Low Risk    742 to 239 mg/dL        = Borderline Risk    240mg /dL and  greater    = High Risk      HDL 09/04/2021 49  >=40 mg/dL Final    Comment: The National Lipid Association and the 09/06/2021 Cholesterol Education Program  (NCEP) have set the guidelines for high-density lipoprotein (HDL) cholesterol  in adults ages 42 and up.      Triglycerides 09/04/2021 100  0 - 149 mg/dL Final    Comment:   TRIGLYCERIDE INTERPRETATION:                          Recommended Fasting Triglyc Levels for Adults                        =============================================                        Desirable                        < 150 mg/dL                        Average                          < 200 mg/dL                        Borderline High             200 to 500 mg/dL                        Hypertriglyceridemic             > 500 mg/dL                        =============================================      LDL Cholesterol 09/04/2021 44.0  0.0 - 100.0 mg/dL Final    LDL/HDL Ratio 09/04/2021 0.9   Final    Chol/HDL Ratio 09/04/2021 2.3  0.0 - 4.4 Final    VLDL 09/04/2021 20.0  5.0 - 40.0 mg/dL Final    Hemoglobin 09/06/2021 09/04/2021 5.6  4.0 - 6.0 % Final    Comment: HEMOGLOBIN A1C INTERPRETATION:    The following arbitrary ranges may be used for interpretation of the results.  However, factors such as duration of diabetes, adherence to therapy, and  patient age should also be considered in assessing degree of blood glucose  control.    Hemoglobin A1C                 Avg. Blood Sugar  --------------------------------------------------------------  6%                           135 mg/dL  7%  170 mg/dL  8%                           205 mg/dL  9%                           240 mg/dL  66%                          275 mg/dL    ======================================================    A1C                      Glucose Control  ----------------------------------------------------------------  < 6.0 %                   Normal  6.0 - 6.9 %               Abnormal  7.0 - 7.9 %                Sub-Optimal Control  > 8.0 %                   Inadequate Control      Est. Avg. Glucose, WB 09/04/2021 114   Final    Est. Avg. Glucose-calculated 09/04/2021 122   Final    WBC 09/04/2021 6.5  3.8 - 10.6 x10e3/mcL Final    RBC 09/04/2021 3.80 (L)  4.00 - 5.60 x10e6/mcL Final    Hemoglobin 09/04/2021 11.5 (L)  13.0 - 17.3 g/dL Final    Hematocrit 44/04/4740 35.5 (L)  38.0 - 52.0 % Final    MCV 09/04/2021 93.4  84.0 - 100.0 fL Final    MCH 09/04/2021 30.3  27.0 - 34.5 pg Final    MCHC 09/04/2021 32.4  32.0 - 36.0 g/dL Final    RDW 59/56/3875 12.5  11.0 - 16.0 % Final    Platelets 09/04/2021 200  140 - 440 x10e3/mcL Final    MPV 09/04/2021 10.9  7.2 - 13.2 fL Final    NRBC Automated 09/04/2021 0.0  0.0 - 0.2 % Final    NRBC Absolute 09/04/2021 0.000  0.000 - 0.012 x10e3/mcL Final    Neutrophils % 09/04/2021 74.7 (H)  42.0 - 74.0 % Final    Lymphocytes 09/04/2021 14.8 (L)  15.0 - 45.0 % Final    Monocytes 09/04/2021 8.2  4.0 - 12.0 % Final    Eosinophils % 09/04/2021 1.1  0.0 - 7.0 % Final    Basophils % 09/04/2021 0.9  0.0 - 2.0 % Final    Neutrophils Absolute 09/04/2021 4.8  1.6 - 7.3 x10e3/mcL Final    Absolute Lymph # 09/04/2021 1.0  1.0 - 3.2 x10e3/mcL Final    Absolute Mono # 09/04/2021 0.5  0.3 - 1.0 x10e3/mcL Final    Absolute Eos # 09/04/2021 0.1  0.0 - 0.5 x10e3/mcL Final    Absolute Baso # 09/04/2021 0.1  0.0 - 0.2 x10e3/mcL Final    Immature Granulocytes 09/04/2021 0.3  0.0 - 0.6 % Final    Immature Grans (Abs) 09/04/2021 0.02  0.00 - 0.06 x10e3/mcL Final      No results found for this visit on 10/15/21.              An electronic signature was used to authenticate this note.    --Claybon Jabs, MD

## 2021-10-17 ENCOUNTER — Encounter

## 2021-10-17 NOTE — Telephone Encounter (Signed)
omeprazole (PRILOSEC) 20 MG delayed release capsule  Once daily    90 day supply    No medication changes    [facility] mail order    Lov 10/15/21  Nov 01/06/22

## 2021-10-18 MED ORDER — OMEPRAZOLE 20 MG PO CPDR
20 MG | ORAL_CAPSULE | Freq: Every day | ORAL | 1 refills | Status: DC
Start: 2021-10-18 — End: 2022-04-18

## 2021-10-23 ENCOUNTER — Encounter

## 2021-10-23 LAB — FOLATE: Folate: 11.5 ng/mL (ref 4.80–24.20)

## 2021-10-23 LAB — FERRITIN: Ferritin: 207 ng/mL (ref 30.0–400.0)

## 2021-10-23 LAB — IRON AND TIBC
Iron Saturation: 26 % (ref 20–40)
Iron: 82 ug/dL (ref 59–158)
TIBC: 315 ug/dL (ref 250–450)
UIBC: 233 ug/dL (ref 112.0–347.0)

## 2021-10-23 LAB — CBC
Hematocrit: 38.3 % (ref 38.0–52.0)
Hemoglobin: 12.6 g/dL — ABNORMAL LOW (ref 13.0–17.3)
MCH: 30.4 pg (ref 27.0–34.5)
MCHC: 32.9 g/dL (ref 32.0–36.0)
MCV: 92.3 fL (ref 84.0–100.0)
MPV: 10.7 fL (ref 7.2–13.2)
NRBC Absolute: 0 10*3/uL (ref 0.000–0.012)
NRBC Automated: 0 % (ref 0.0–0.2)
Platelets: 175 10*3/uL (ref 140–440)
RBC: 4.15 x10e6/mcL (ref 4.00–5.60)
RDW: 12.6 % (ref 11.0–16.0)
WBC: 5.4 10*3/uL (ref 3.8–10.6)

## 2021-10-23 LAB — VITAMIN B12: Vitamin B-12: 361 pg/mL (ref 232–1245)

## 2021-10-24 ENCOUNTER — Telehealth

## 2021-10-24 NOTE — Telephone Encounter (Signed)
CBC, CMP, LIPID, AND A1C

## 2021-11-19 ENCOUNTER — Inpatient Hospital Stay: Admit: 2021-11-19 | Discharge: 2021-11-19 | Payer: MEDICARE | Primary: Family Medicine

## 2021-11-19 DIAGNOSIS — Z955 Presence of coronary angioplasty implant and graft: Secondary | ICD-10-CM

## 2021-11-25 ENCOUNTER — Encounter

## 2021-11-25 MED ORDER — LOSARTAN POTASSIUM 100 MG PO TABS
100 MG | ORAL_TABLET | ORAL | 1 refills | Status: AC
Start: 2021-11-25 — End: 2022-01-06

## 2021-11-25 MED ORDER — CARVEDILOL 6.25 MG PO TABS
6.25 MG | ORAL_TABLET | ORAL | 1 refills | Status: AC
Start: 2021-11-25 — End: ?

## 2021-11-25 MED ORDER — ROSUVASTATIN CALCIUM 20 MG PO TABS
20 MG | ORAL_TABLET | Freq: Every day | ORAL | 1 refills | Status: AC
Start: 2021-11-25 — End: 2022-01-06

## 2021-11-25 NOTE — Telephone Encounter (Signed)
Pls es

## 2021-12-09 ENCOUNTER — Inpatient Hospital Stay: Admit: 2021-12-09 | Discharge: 2021-12-09 | Payer: MEDICARE | Primary: Family Medicine

## 2021-12-11 ENCOUNTER — Encounter: Payer: MEDICARE | Primary: Family Medicine

## 2021-12-12 ENCOUNTER — Encounter: Payer: MEDICARE | Primary: Family Medicine

## 2021-12-16 ENCOUNTER — Inpatient Hospital Stay: Admit: 2021-12-16 | Discharge: 2021-12-16 | Payer: MEDICARE | Primary: Family Medicine

## 2021-12-18 ENCOUNTER — Encounter: Payer: MEDICARE | Primary: Family Medicine

## 2021-12-18 DIAGNOSIS — Z955 Presence of coronary angioplasty implant and graft: Secondary | ICD-10-CM

## 2021-12-18 NOTE — Other (Signed)
Brief Medical Nutrition Therapy Note:     Patient is a 71yo male with PMH previous MI, GERD, HTN, HLD, pre-DM s/p PCI. Seen by RD in Cardiac Rehab for initial nutrition assessment.     24 hour diet recall obtained below:   Breakfast: cheerios with 2% milk and splenda, or oatmeal with cream, or eggs/wheat toast; occasional grits/bacon/ham/eggs   Lunch: homemade vegetable soup, [place] sandwich; occasional taco bell, burger or other fast food   Dinner: grilled/air fried salmon or fish and grits, pork chops, sauteed spinach, sweet potato   Snacks: piece of fruit   Sweets: tries to avoid at home; special occasions   Beverages: water, coffee with cream   ETOH: a few vodka drinks every other night     RD reviewed eating habits with patient today. He has good awareness about relationship between diet, exercise, BGL, and weight. Has been in weight loss programs through [place] years ago (protein shakes/calorie restricted diet). Has been on semaglutide with weight loss success since 2020 (states he was in a clinical trial). States his A1c improved from "6s" to 5.6% on semaglutide. Strong family hx DM (type 1 and type 2). States "I have been big my whole life". He is motivated to make diet changes and lose weight; has lost ~2-4lbs so far in program. Endorses poor hydration habit.     Discussed small changes to improve diet for BGL and weight loss including high protein breakfast, including high fiber foods in meals/snacks, reducing added sugars, focusing on whole foods and set goal for water intake.     Vitamins/supplements reviewed- none. Lipid panel reviewed.     Goals set with RD:   Follow whole foods based diet with emphasis on increasing fiber intake and hydration to promote weight loss, BGL control.   Weight loss at least 5-10%.     Expect fair compliance. In contemplation stage of change.     Nutrition follow up: prn; by next ITP follow up date    [place], LD  [facility] Cardiac Rehab Dietitian

## 2021-12-19 ENCOUNTER — Encounter: Payer: MEDICARE | Primary: Family Medicine

## 2021-12-23 ENCOUNTER — Inpatient Hospital Stay: Admit: 2021-12-23 | Discharge: 2021-12-23 | Payer: MEDICARE | Primary: Family Medicine

## 2021-12-25 ENCOUNTER — Inpatient Hospital Stay: Admit: 2021-12-25 | Discharge: 2021-12-25 | Payer: MEDICARE | Primary: Family Medicine

## 2021-12-26 ENCOUNTER — Encounter: Payer: MEDICARE | Primary: Family Medicine

## 2021-12-30 ENCOUNTER — Encounter: Payer: MEDICARE | Primary: Family Medicine

## 2021-12-31 ENCOUNTER — Encounter

## 2021-12-31 LAB — CBC WITH AUTO DIFFERENTIAL
Absolute Baso #: 0 10*3/uL (ref 0.0–0.2)
Absolute Eos #: 0.1 10*3/uL (ref 0.0–0.5)
Absolute Lymph #: 1.2 10*3/uL (ref 1.0–3.2)
Absolute Mono #: 0.5 10*3/uL (ref 0.3–1.0)
Basophils %: 0.8 % (ref 0.0–2.0)
Eosinophils %: 1.5 % (ref 0.0–7.0)
Hematocrit: 36.5 % — ABNORMAL LOW (ref 38.0–52.0)
Hemoglobin: 12.5 g/dL — ABNORMAL LOW (ref 13.0–17.3)
Immature Grans (Abs): 0.02 10*3/uL (ref 0.00–0.06)
Immature Granulocytes: 0.4 % (ref 0.0–0.6)
Lymphocytes: 22.4 % (ref 15.0–45.0)
MCH: 31 pg (ref 27.0–34.5)
MCHC: 34.2 g/dL (ref 32.0–36.0)
MCV: 90.6 fL (ref 84.0–100.0)
MPV: 11.3 fL (ref 7.2–13.2)
Monocytes: 9.5 % (ref 4.0–12.0)
NRBC Absolute: 0 10*3/uL (ref 0.000–0.012)
NRBC Automated: 0 % (ref 0.0–0.2)
Neutrophils %: 65.4 % (ref 42.0–74.0)
Neutrophils Absolute: 3.4 10*3/uL (ref 1.6–7.3)
Platelets: 163 10*3/uL (ref 140–440)
RBC: 4.03 x10e6/mcL (ref 4.00–5.60)
RDW: 12.7 % (ref 11.0–16.0)
WBC: 5.2 10*3/uL (ref 3.8–10.6)

## 2021-12-31 LAB — COMPREHENSIVE METABOLIC PANEL
ALT: 19 U/L (ref 0–50)
AST: 19 U/L (ref 0–50)
Albumin/Globulin Ratio: 1.8 (ref 1.00–2.70)
Albumin: 4.2 g/dL (ref 3.5–5.2)
Alk Phosphatase: 41 U/L (ref 40–130)
Anion Gap: 10 mmol/L (ref 2–17)
BUN: 23 mg/dL (ref 8–23)
CO2: 27 mmol/L (ref 22–29)
Calcium: 9.3 mg/dL (ref 8.8–10.2)
Chloride: 104 mmol/L (ref 98–107)
Creatinine: 0.8 mg/dL (ref 0.7–1.3)
Est, Glom Filt Rate: 93 mL/min/1.73m (ref >=60)
Globulin: 2.3 g/dL (ref 1.9–4.4)
Glucose: 106 mg/dL — ABNORMAL HIGH (ref 70–99)
OSMOLALITY CALCULATED: 285 mOsm/kg (ref 270–287)
Potassium: 4.4 mmol/L (ref 3.5–5.3)
Sodium: 141 mmol/L (ref 135–145)
Total Bilirubin: 0.45 mg/dL (ref 0.00–1.20)
Total Protein: 6.5 g/dL (ref 6.4–8.3)

## 2021-12-31 LAB — LIPID PANEL
Chol/HDL Ratio: 2.3 (ref 0.0–4.4)
Cholesterol: 127 mg/dL (ref 100–200)
HDL: 55 mg/dL (ref >=40)
LDL Cholesterol: 51.6 mg/dL (ref 0.0–100.0)
LDL/HDL Ratio: 0.9
Triglycerides: 102 mg/dL (ref 0–149)
VLDL: 20.4 mg/dL (ref 5.0–40.0)

## 2021-12-31 LAB — HEMOGLOBIN A1C
Est. Avg. Glucose, WB: 117
Est. Avg. Glucose-calculated: 126
Hemoglobin A1C: 5.7 % (ref 4.0–6.0)

## 2021-12-31 NOTE — Other (Signed)
We will review labs at upcoming appointment.Call us if you need to reschedule.

## 2022-01-01 ENCOUNTER — Encounter: Payer: MEDICARE | Primary: Family Medicine

## 2022-01-02 ENCOUNTER — Inpatient Hospital Stay: Admit: 2022-01-02 | Discharge: 2022-01-02 | Payer: MEDICARE | Primary: Family Medicine

## 2022-01-06 ENCOUNTER — Ambulatory Visit: Admit: 2022-01-06 | Discharge: 2022-01-06 | Payer: MEDICARE | Attending: Family Medicine | Primary: Family Medicine

## 2022-01-06 DIAGNOSIS — D649 Anemia, unspecified: Secondary | ICD-10-CM

## 2022-01-06 MED ORDER — ROSUVASTATIN CALCIUM 40 MG PO TABS
40 MG | ORAL_TABLET | Freq: Every day | ORAL | 3 refills | Status: DC
Start: 2022-01-06 — End: 2022-01-09

## 2022-01-06 NOTE — Progress Notes (Signed)
William [name] (DOB:  [birth year]) is a 71 y.o. male, here for evaluation of the following chief complaint(s):  Diabetes (DM follow up.  Pt has completed 4wks cardiac rehab. pt to see cards 01/16/2022 dr. Tamala Julian.)           ASSESSMENT/PLAN:  1. Anemia, unspecified type  -     CBC with Auto Differential; Future  -     Iron and TIBC; Future  -     Ferritin; Future  2. Essential hypertension  3. Anxiety  4. Gastroesophageal reflux disease without esophagitis  5. Mixed hyperlipidemia  -     rosuvastatin (CRESTOR) 40 MG tablet; Take 1 tablet by mouth daily, Disp-90 tablet, R-3Adjust Sig  6. Type 2 diabetes mellitus without complication, without long-term current use of insulin (HCC)  -     Hemoglobin A1C; Future  7. Ischemic cardiomyopathy  Comments:  Added automatically from request for surgery [id number]  8. Coronary artery disease due to lipid rich plaque  9. Atherosclerosis of native coronary artery of native heart with angina pectoris ([place])    We reviewed labs diabetes is controlled weight is improving.  CAD symptoms are controlled and cardiac rehab is going well.Continue current medications and get blood work to follow-up anemia in 3 months.  For anxiety we will stop Cymbalta.  Return in about 3 months (around 04/08/2022).       Subjective   SUBJECTIVE/OBJECTIVE:  Diabetes    She is here follow-up multiple medical problems.  No chest pain trouble breathing.  He is doing cardiac rehab and improving.  No other symptoms.  Doing well with his blood pressure management.  He is weaned off of Cymbalta.  He is taking it every 3 days currently    Prior to Admission medications    Medication Sig Start Date End Date Taking? Authorizing Provider   rosuvastatin (CRESTOR) 40 MG tablet Take 1 tablet by mouth daily 01/06/22  Yes Rowe, Anselmo Pickler., MD   carvedilol (COREG) 6.25 MG tablet TAKE 1 TABLET TWICE A DAY  AS DIRECTED 11/25/21  Yes Carol Ada, PA-C   omeprazole (PRILOSEC) 20 MG delayed release capsule Take 1 capsule by  mouth daily 10/18/21  Yes Hilton, Hessie Diener, APRN - NP   losartan (COZAAR) 100 MG tablet Take 1 tablet by mouth daily   Yes [provider]   ticagrelor (BRILINTA) 90 MG TABS tablet Take 1 tablet by mouth 2 times daily 08/22/21  Yes Dalmau, Valorie Roosevelt, PA-C   fluticasone (FLONASE) 50 MCG/ACT nasal spray 1 spray by Each Nostril route daily   Yes [provider]   nitroGLYCERIN (NITROSTAT) 0.4 MG SL tablet as directed Sublingual 1 tab under tongue q 5 minutes x 3 doses chest pain doe not help call 911  Patient taking differently: Place 1 tablet under the tongue every 5 minutes as needed for Chest pain as directed Sublingual 1 tab under tongue q 5 minutes x 3 doses chest pain doe not help call 911 06/06/21  Yes Rowe, Anselmo Pickler., MD   Semaglutide, 2 MG/DOSE, (OZEMPIC, 2 MG/DOSE,) 8 MG/3ML SOPN Inject 2 mg into the skin once a week 06/06/21  Yes Rowe, Anselmo Pickler., MD   aspirin 81 MG EC tablet Take 1 tablet by mouth daily   Yes [provider]   Coenzyme Q10 (COQ10 PO) Take 200 mg by mouth daily   Yes [provider]   diphenhydrAMINE-APAP, sleep, (TYLENOL PM EXTRA STRENGTH) 25-500 MG tablet 2  tablets as needed for Sleep   Yes Rsfh Automatic Reconciliation, Rsfh, MD   Latanoprost (XELPROS) 0.005 % EMUL Place 1 drop into both eyes every evening   Yes Rsfh Automatic Reconciliation, Rsfh, MD       No Known Allergies    Past Medical History:   Diagnosis Date    Eye pressure     GERD (gastroesophageal reflux disease)     Heart attack ([place])     Hyperlipidemia     Hypertension     Prediabetes        Past Surgical History:   Procedure Laterality Date    BACK SURGERY  1991    CARDIAC PROCEDURE N/A 08/21/2021    Left heart cath performed by Denzil Magnuson, MD at [facility]    CARDIAC PROCEDURE N/A 08/21/2021    Insert stent des coronary performed by Denzil Magnuson, MD at [facility]    CAROTID STENT  01/2018    KNEE CARTILAGE SURGERY      TONSILLECTOMY           Family  History   Problem Relation Age of Onset    Alzheimer's Disease Mother         TIA    Heart Failure Father         DM    Heart Failure Maternal Grandmother     No Known Problems Maternal Grandfather     No Known Problems Paternal Grandmother     No Known Problems Paternal Grandfather        Social History     Tobacco Use    Smoking status: Former     Packs/day: 0.25     Years: 0.50     Additional pack years: 0.00     Total pack years: 0.13     Types: Cigarettes     Quit date: 1984     Years since quitting: 39.9    Smokeless tobacco: Never   Vaping Use    Vaping Use: Never used   Substance Use Topics    Alcohol use: Yes     Alcohol/week: 4.0 standard drinks of alcohol     Types: 4 Shots of liquor per week    Drug use: Never          Review of Systems   All other systems reviewed and are negative.       Objective       Vitals:    01/06/22 1115   BP: 130/74   Site: Left Upper Arm   Position: Sitting   Cuff Size: Large Adult   Pulse: 65   Temp: 98.1 F (36.7 C)   TempSrc: Oral   SpO2: 97%   Weight: 121 kg (266 lb 12.8 oz)   Height: 1.93 m (6' 3.98")        BP Readings from Last 3 Encounters:   01/06/22 130/74   10/15/21 126/64   09/19/21 132/84     Wt Readings from Last 3 Encounters:   01/06/22 121 kg (266 lb 12.8 oz)   01/02/22 122 kg (269 lb)   01/01/22 122.5 kg (270 lb)       Physical Exam  Vitals and nursing note reviewed.   Constitutional:       General: He is not in acute distress.  HENT:      Head: Normocephalic and atraumatic.   Cardiovascular:      Rate and Rhythm: Normal rate and regular rhythm.  Pulses: Normal pulses.      Heart sounds: Normal heart sounds. No murmur heard.  Pulmonary:      Breath sounds: Normal breath sounds.   Abdominal:      General: Abdomen is flat. Bowel sounds are normal.      Palpations: Abdomen is soft.   Neurological:      General: No focal deficit present.      Mental Status: He is alert and oriented to person, place, and time.                Orders Only on 12/31/2021    Component Date Value Ref Range Status    Cholesterol 12/31/2021 127  100 - 200 mg/dL Final    Comment: The National Cholesterol Education Program has published reference  cholesterol values for cardiovascular risk to be:    Less than 200 mg/dL     = Low Risk    200 to 239 mg/dL        = Borderline Risk    260m/dL and greater    = High Risk      HDL 12/31/2021 55  >=40 mg/dL Final    Comment: The National Lipid Association and the [employer]olesterol Education Program  (NCEP) have set the guidelines for high-density lipoprotein (HDL) cholesterol  in adults ages 169and up.      Triglycerides 12/31/2021 102  0 - 149 mg/dL Final    Comment:   TRIGLYCERIDE INTERPRETATION:                          Recommended Fasting Triglyc Levels for Adults                        =============================================                        Desirable                        < 150 mg/dL                        Average                          < 200 mg/dL                        Borderline High             200 to 500 mg/dL                        Hypertriglyceridemic             > 500 mg/dL                        =============================================      LDL Cholesterol 12/31/2021 51.6  0.0 - 100.0 mg/dL Final    LDL/HDL Ratio 12/31/2021 0.9   Final    Chol/HDL Ratio 12/31/2021 2.3  0.0 - 4.4 Final    VLDL 12/31/2021 20.4  5.0 - 40.0 mg/dL Final    Sodium 12/31/2021 141  135 - 145 mmol/L Final    Potassium 12/31/2021 4.4  3.5 - 5.3 mmol/L Final    Chloride 12/31/2021 104  98 - 107 mmol/L Final  CO2 12/31/2021 27  22 - 29 mmol/L Final    Glucose 12/31/2021 106 (H)  70 - 99 mg/dL Final    BUN 12/31/2021 23  8 - 23 mg/dL Final    Creatinine 12/31/2021 0.8  0.7 - 1.3 mg/dL Final    Anion Gap 12/31/2021 10  2 - 17 mmol/L Final    OSMOLALITY CALCULATED 12/31/2021 285  270 - 287 mOsm/kg Final    Calcium 12/31/2021 9.3  8.8 - 10.2 mg/dL Final    Total Protein 12/31/2021 6.5  6.4 - 8.3 g/dL Final    Albumin 12/31/2021 4.2  3.5 -  5.2 g/dL Final    Globulin 12/31/2021 2.3  1.9 - 4.4 g/dL Final    Albumin/Globulin Ratio 12/31/2021 1.80  1.00 - 2.70 Final    Total Bilirubin 12/31/2021 0.45  0.00 - 1.20 mg/dL Final    Alk Phosphatase 12/31/2021 41  40 - 130 unit/L Final    AST 12/31/2021 19  0 - 50 unit/L Final    ALT 12/31/2021 19  0 - 50 unit/L Final    Est, Glom Filt Rate 12/31/2021 93  >=60 mL/min/1.32mFinal    Comment: VERIFIED by Discern Expert.  GFR Interpretation:                                                                         % OF  KIDNEY  GFR                                                        STAGE  FUNCTION  ==================================================================================    > 90        Normal kidney function                       STAGE 1  90-100%  89 to 60      Mild loss of kidney function                 STAGE 2  80-60%  59 to 45      Mild to moderate loss of kidney function     STAGE 3a  59-45%  44 to 30      Moderate to severe loss of kidney function   STAGE 3b  44-30%  29 to 15      Severe loss of kidney function               STAGE 4  29-15%    < 15        Kidney failure                               STAGE 5  <15%  ==================================================================================  Modified from [place]   GFR Calculation performed using the CKD-EPI 2021 equation developed for use  with IDMS traceable creatinine methods and  is the calculation recommended by  the [facility] for estimating GFR in adults.      WBC 12/31/2021 5.2  3.8 - 10.6 x10e3/mcL Final    RBC 12/31/2021 4.03  4.00 - 5.60 x10e6/mcL Final    Hemoglobin 12/31/2021 12.5 (L)  13.0 - 17.3 g/dL Final    Hematocrit 12/31/2021 36.5 (L)  38.0 - 52.0 % Final    MCV 12/31/2021 90.6  84.0 - 100.0 fL Final    MCH 12/31/2021 31.0  27.0 - 34.5 pg Final    MCHC 12/31/2021 34.2  32.0 - 36.0 g/dL Final    RDW 12/31/2021 12.7  11.0 - 16.0 % Final    Platelets 12/31/2021  163  140 - 440 x10e3/mcL Final    MPV 12/31/2021 11.3  7.2 - 13.2 fL Final    NRBC Automated 12/31/2021 0.0  0.0 - 0.2 % Final    NRBC Absolute 12/31/2021 0.000  0.000 - 0.012 x10e3/mcL Final    Neutrophils % 12/31/2021 65.4  42.0 - 74.0 % Final    Lymphocytes 12/31/2021 22.4  15.0 - 45.0 % Final    Monocytes 12/31/2021 9.5  4.0 - 12.0 % Final    Eosinophils % 12/31/2021 1.5  0.0 - 7.0 % Final    Basophils % 12/31/2021 0.8  0.0 - 2.0 % Final    Neutrophils Absolute 12/31/2021 3.4  1.6 - 7.3 x10e3/mcL Final    Absolute Lymph # 12/31/2021 1.2  1.0 - 3.2 x10e3/mcL Final    Absolute Mono # 12/31/2021 0.5  0.3 - 1.0 x10e3/mcL Final    Absolute Eos # 12/31/2021 0.1  0.0 - 0.5 x10e3/mcL Final    Absolute Baso # 12/31/2021 0.0  0.0 - 0.2 x10e3/mcL Final    Immature Granulocytes 12/31/2021 0.4  0.0 - 0.6 % Final    Immature Grans (Abs) 12/31/2021 0.02  0.00 - 0.06 x10e3/mcL Final    Hemoglobin A1C 12/31/2021 5.7  4.0 - 6.0 % Final    Comment: HEMOGLOBIN A1C INTERPRETATION:    The following arbitrary ranges may be used for interpretation of the results.  However, factors such as duration of diabetes, adherence to therapy, and  patient age should also be considered in assessing degree of blood glucose  control.    Hemoglobin A1C                 Avg. Blood Sugar  --------------------------------------------------------------  6%                           135 mg/dL  7%                           170 mg/dL  8%                           205 mg/dL  9%                           240 mg/dL  10%                          275 mg/dL    ======================================================    A1C                      Glucose Control  ----------------------------------------------------------------  < 6.0 %  Normal  6.0 - 6.9 %               Abnormal  7.0 - 7.9 %               Sub-Optimal Control  > 8.0 %                   Inadequate Control      Est. Avg. Glucose, WB 12/31/2021 117   Final    Est. Avg. Glucose-calculated  12/31/2021 126   Final      No results found for this visit on 01/06/22.              An electronic signature was used to authenticate this note.    --Alexis Goodell, MD

## 2022-01-07 ENCOUNTER — Inpatient Hospital Stay: Admit: 2022-01-07 | Discharge: 2022-01-07 | Payer: MEDICARE | Primary: Family Medicine

## 2022-01-08 ENCOUNTER — Encounter: Payer: MEDICARE | Primary: Family Medicine

## 2022-01-09 ENCOUNTER — Encounter

## 2022-01-13 ENCOUNTER — Inpatient Hospital Stay: Admit: 2022-01-13 | Discharge: 2022-01-13 | Payer: MEDICARE | Primary: Family Medicine

## 2022-01-13 MED ORDER — ROSUVASTATIN CALCIUM 40 MG PO TABS
40 MG | ORAL_TABLET | Freq: Every day | ORAL | 3 refills | Status: DC
Start: 2022-01-13 — End: 2022-04-23

## 2022-01-13 NOTE — Telephone Encounter (Signed)
Per protocol criteria met for refill. Refill sent per physician ordered protocol. See signed orders.

## 2022-01-15 ENCOUNTER — Encounter: Payer: MEDICARE | Primary: Family Medicine

## 2022-01-16 ENCOUNTER — Inpatient Hospital Stay: Admit: 2022-01-16 | Discharge: 2022-01-16 | Payer: MEDICARE | Primary: Family Medicine

## 2022-01-16 ENCOUNTER — Encounter: Admit: 2022-01-16 | Discharge: 2022-01-16 | Payer: MEDICARE | Attending: Internal Medicine | Primary: Family Medicine

## 2022-01-16 DIAGNOSIS — I2510 Atherosclerotic heart disease of native coronary artery without angina pectoris: Secondary | ICD-10-CM

## 2022-01-16 NOTE — Progress Notes (Signed)
Review of Systems   Constitutional:  Negative for chills, fatigue, fever and unexpected weight change.   HENT:  Negative for hearing loss, nosebleeds, sore throat, tinnitus and voice change.    Eyes:  Negative for visual disturbance.   Respiratory:  Negative for apnea, cough, chest tightness, shortness of breath and wheezing.    Cardiovascular:  Negative for chest pain, palpitations and leg swelling.   Gastrointestinal:  Negative for anal bleeding, blood in stool, constipation and diarrhea.   Endocrine: Negative for polydipsia.   Genitourinary:  Negative for difficulty urinating, dysuria, hematuria and urgency.   Musculoskeletal:  Negative for myalgias.   Skin:  Negative for rash and wound.   Neurological:  Negative for dizziness, syncope, weakness, light-headedness and numbness.   Hematological:  Does not bruise/bleed easily.   Psychiatric/Behavioral:  Negative for sleep disturbance.

## 2022-01-16 NOTE — Progress Notes (Signed)
Date:  January 16, 2022  Patient name: William [name] [facility]  Date of Birth: 10-30-1950      REASON FOR CLINIC VISIT: Follow-up Follow-up (4 mo f/u /)        HISTORY OF PRESENT ILLNESS        The patient is a 71 y.o. male who returns for follow-up.  He is doing extremely well at today's visit.  He has been doing cardiac rehab for the past month or so and is progressing very nicely.  No issues with exertional chest pain or shortness of breath.  Blood pressure has been stable.  No significant lower extreme edema, PND or orthopnea.  He is tolerating aspirin and Brilinta.      PAST HISTORY          Past Medical History:   has a past medical history of Eye pressure, GERD (gastroesophageal reflux disease), Heart attack ([place]), Hyperlipidemia, Hypertension, and Prediabetes.    Past Surgical History:   has a past surgical history that includes Carotid stent (01/2018); back surgery (1991); Tonsillectomy; Knee cartilage surgery; Cardiac procedure (N/A, 08/21/2021); and Cardiac procedure (N/A, 08/21/2021).     Social History:   reports that he quit smoking about 39 years ago. His smoking use included cigarettes. He has a 0.13 pack-year smoking history. He has never used smokeless tobacco. He reports current alcohol use of about 4.0 standard drinks of alcohol per week. He reports that he does not use drugs.     Family History: family history includes Alzheimer's Disease in his mother; Heart Failure in his father and maternal grandmother; No Known Problems in his maternal grandfather, paternal grandfather, and paternal grandmother.    Allergies:  Patient has no known allergies.    Home Medications:    Prior to Admission medications    Medication Sig Start Date End Date Taking? Authorizing Provider   rosuvastatin (CRESTOR) 40 MG tablet Take 1 tablet by mouth daily 01/13/22  Yes Rowe, Anselmo Pickler., MD   carvedilol (COREG) 6.25 MG tablet TAKE 1 TABLET TWICE A DAY  AS DIRECTED 11/25/21  Yes Carol Ada, PA-C   omeprazole (PRILOSEC)  20 MG delayed release capsule Take 1 capsule by mouth daily 10/18/21  Yes Hilton, Hessie Diener, APRN - NP   losartan (COZAAR) 100 MG tablet Take 1 tablet by mouth daily   Yes [provider]   ticagrelor (BRILINTA) 90 MG TABS tablet Take 1 tablet by mouth 2 times daily 08/22/21  Yes Dalmau, Valorie Roosevelt, PA-C   fluticasone (FLONASE) 50 MCG/ACT nasal spray 1 spray by Each Nostril route daily   Yes [provider]   nitroGLYCERIN (NITROSTAT) 0.4 MG SL tablet as directed Sublingual 1 tab under tongue q 5 minutes x 3 doses chest pain doe not help call 911  Patient taking differently: Place 1 tablet under the tongue every 5 minutes as needed for Chest pain as directed Sublingual 1 tab under tongue q 5 minutes x 3 doses chest pain doe not help call 911 06/06/21  Yes Rowe, Anselmo Pickler., MD   Semaglutide, 2 MG/DOSE, (OZEMPIC, 2 MG/DOSE,) 8 MG/3ML SOPN Inject 2 mg into the skin once a week 06/06/21  Yes Rowe, Anselmo Pickler., MD   aspirin 81 MG EC tablet Take 1 tablet by mouth daily   Yes [provider]   Coenzyme Q10 (COQ10 PO) Take 200 mg by mouth daily   Yes [provider]   diphenhydrAMINE-APAP, sleep, (TYLENOL PM EXTRA STRENGTH) 25-500 MG  tablet 2 tablets as needed for Sleep   Yes Rsfh Automatic Reconciliation, Rsfh, MD   Latanoprost (XELPROS) 0.005 % EMUL Place 1 drop into both eyes every evening   Yes Rsfh Automatic Reconciliation, Rsfh, MD         REVIEW OF SYSTEMS      Review of Systems   Constitutional:  Negative for chills, fatigue, fever and unexpected weight change.   HENT:  Negative for hearing loss, nosebleeds, sore throat, tinnitus and voice change.    Eyes:  Negative for visual disturbance.   Respiratory:  Negative for apnea, cough, chest tightness, shortness of breath and wheezing.    Cardiovascular:  Negative for chest pain, palpitations and leg swelling.   Gastrointestinal:  Negative for anal bleeding, blood in stool, constipation and diarrhea.   Endocrine: Negative for polydipsia.    Genitourinary:  Negative for difficulty urinating, dysuria, hematuria and urgency.   Musculoskeletal:  Negative for myalgias.   Skin:  Negative for rash and wound.   Neurological:  Negative for dizziness, syncope, weakness, light-headedness and numbness.   Hematological:  Does not bruise/bleed easily.   Psychiatric/Behavioral:  Negative for sleep disturbance.           PHYSICAL EXAM            BP 130/70 (Site: Right Upper Arm, Position: Sitting, Cuff Size: Medium Adult)   Pulse 74   Resp 20   Wt 120.7 kg (266 lb)   SpO2 99%   BMI 32.39 kg/m      GEN: NAD  HEENT: NCAT  NECK: No JVD, No carotid bruits, normal carotid upstrokes  CV: RRR, no murmurs, normal S1, S2  Lungs: CTAB  Abdomen: soft, NT, ND  Extremities: No edema  Pulses: 2+  Neuro: non-focal  Psych: appropriate    DIAGNOSTIC STUDIES           Most recent EKG: No results found for this or any previous visit (from the past 4464 hour(s)).    Last Echo result: No results found for this or any previous visit.      Last Stress Test: No results found for this or any previous visit.       Last Cardiac Cath: 08/21/21    CARDIAC PROCEDURE 08/22/2021  8:42 AM (Final)    Conclusion  Obstructive 2 vessel  Recent episode of chest pain at home  First pictures had less than TIMI 1 flow however this improved with repeat injections    RCA has minimal disease  Patent LCX stent  LAD and diagonal have high grade >90% stenoses that are heavily calcified on IVUS    Successful PCI with DES after non-compliant, cutting and shockwave IVL angioplasty    Normal LVEDP      Recommend DAPT x 1 year    Signed by: Arnette Felts, MD on 08/22/2021  8:42 AM       Most Recent Labs:  Lab Results   Component Value Date    WBC 5.2 12/31/2021    HGB 12.5 (L) 12/31/2021    HCT 36.5 (L) 12/31/2021    MCV 90.6 12/31/2021    PLT 163 12/31/2021      Lab Results   Component Value Date/Time    NA 141 12/31/2021 08:01 AM    K 4.4 12/31/2021 08:01 AM    CL 104 12/31/2021 08:01 AM    CO2 27  12/31/2021 08:01 AM    BUN 23 12/31/2021 08:01 AM    CREATININE 0.8 12/31/2021 08:01  AM    GLUCOSE 106 12/31/2021 08:01 AM    CALCIUM 9.3 12/31/2021 08:01 AM    LABGLOM 93 12/31/2021 08:01 AM       Lab Results   Component Value Date    CHOL 127 12/31/2021    CHOL 113 09/04/2021    CHOL 141 06/04/2021     Lab Results   Component Value Date/Time    ALKPHOS 41 12/31/2021 08:01 AM    ALT 19 12/31/2021 08:01 AM    AST 19 12/31/2021 08:01 AM    PROT 6.5 12/31/2021 08:01 AM    BILITOT 0.45 12/31/2021 08:01 AM    BILIDIR <0.20 04/05/2018 07:03 AM    LABALBU 4.2 12/31/2021 08:01 AM      Lab Results   Component Value Date    TRIG 102 12/31/2021    TRIG 100 09/04/2021    TRIG 119 06/04/2021     Lab Results   Component Value Date    HDL 55 12/31/2021    HDL 49 09/04/2021    HDL 59 06/04/2021     Lab Results   Component Value Date    LDLCHOLESTEROL 51.6 12/31/2021    LDLCHOLESTEROL 44.0 09/04/2021    LDLCHOLESTEROL 58.2 06/04/2021     Lab Results   Component Value Date    VLDL 20.4 12/31/2021    VLDL 20.0 09/04/2021    VLDL 23.8 06/04/2021     Lab Results   Component Value Date    CHOLHDLRATIO 2.3 12/31/2021    CHOLHDLRATIO 2.3 09/04/2021    CHOLHDLRATIO 2.4 06/04/2021          ASSESSMENT / RECOMMENDATIONS        1. Atherosclerosis of native coronary artery of native heart without angina pectoris  -     Echo (TTE) complete (PRN contrast/bubble/strain/3D); Future  2. Essential hypertension  -     Echo (TTE) complete (PRN contrast/bubble/strain/3D); Future     He is doing well at today's visit.  Blood pressure and pulse look good.  He is euvolemic.  He has no active ischemic sounding symptoms.  His LDL is at goal in the 50s.  He is tolerating DAPT in the form of aspirin and Brilinta.  He will continue the Brilinta until 1 year post PCI at which point he will continue on the baby aspirin thereafter.  Will plan on seeing him back then and repeating a surface echocardiogram to reassess his underlying ventricular function.    Arnette Felts, MD   [facility] / [facility]

## 2022-01-20 ENCOUNTER — Inpatient Hospital Stay: Admit: 2022-01-20 | Discharge: 2022-01-20 | Payer: MEDICARE | Primary: Family Medicine

## 2022-01-20 DIAGNOSIS — Z955 Presence of coronary angioplasty implant and graft: Secondary | ICD-10-CM

## 2022-01-22 ENCOUNTER — Inpatient Hospital Stay: Admit: 2022-01-22 | Discharge: 2022-01-22 | Payer: MEDICARE | Primary: Family Medicine

## 2022-01-23 ENCOUNTER — Inpatient Hospital Stay: Admit: 2022-01-23 | Discharge: 2022-01-23 | Payer: MEDICARE | Primary: Family Medicine

## 2022-01-27 ENCOUNTER — Inpatient Hospital Stay: Admit: 2022-01-27 | Discharge: 2022-01-27 | Payer: MEDICARE | Primary: Family Medicine

## 2022-01-29 ENCOUNTER — Encounter: Payer: MEDICARE | Primary: Family Medicine

## 2022-01-30 ENCOUNTER — Inpatient Hospital Stay: Admit: 2022-01-30 | Discharge: 2022-01-30 | Payer: MEDICARE | Primary: Family Medicine

## 2022-01-31 ENCOUNTER — Encounter: Attending: Internal Medicine | Primary: Family Medicine

## 2022-02-03 ENCOUNTER — Encounter: Payer: MEDICARE | Primary: Family Medicine

## 2022-02-05 ENCOUNTER — Encounter: Payer: MEDICARE | Primary: Family Medicine

## 2022-02-05 NOTE — Other (Signed)
Pt. Fell at home and was not able to return to exercise. Did not complete 6MWT or test packets.

## 2022-02-06 ENCOUNTER — Encounter: Payer: MEDICARE | Primary: Family Medicine

## 2022-02-12 ENCOUNTER — Encounter: Payer: MEDICARE | Primary: Family Medicine

## 2022-02-13 ENCOUNTER — Encounter: Payer: MEDICARE | Primary: Family Medicine

## 2022-02-14 ENCOUNTER — Encounter: Payer: MEDICARE | Primary: Family Medicine

## 2022-02-19 ENCOUNTER — Inpatient Hospital Stay: Payer: MEDICARE | Primary: Family Medicine

## 2022-02-20 ENCOUNTER — Ambulatory Visit: Admit: 2022-02-20 | Discharge: 2022-02-20 | Payer: MEDICARE | Attending: Medical | Primary: Family Medicine

## 2022-02-20 DIAGNOSIS — W010XXA Fall on same level from slipping, tripping and stumbling without subsequent striking against object, initial encounter: Secondary | ICD-10-CM

## 2022-02-20 NOTE — Progress Notes (Signed)
ACUTE VISIT  Name: William [name] FGHWE'X Date: 02/20/2022   MRN: [id number] Sex: Male   Age: 72 y.o. Ethnicity: Non-Hispanic / Non Latino   DOB: 03/16/1950 Race: White (non-Hispanic)      Chief Complaint   Patient presents with    Leg Pain     Upper (R) leg pain fell last week radiating down b/l legs to calf area -denies tingling/numbness/ hurts more when shifting weight while sitting     HISTORY OF PRESENT ILLNESS  William [name] presents with complaint of sharp, shooting pain of bilateral lower extremities. He tripped and fell last Thursday in parking lot. Tripped over gas hose. Landed on RIGHT hip and shoulder. Denies hitting head or LOC. Pain affects bilateral lower extremities equally from buttocks to calves. Pain triggered by standing or walking and worsens as day progresses. Resolves with sitting. Denies back pain. Denies pain with sitting or laying supine. Has taken ES Tylenol.    No Known Allergies    Prior to Visit Medications    Medication Sig Taking? Authorizing Provider   latanoprost (XALATAN) 0.005 % ophthalmic solution  Yes [provider]   rosuvastatin (CRESTOR) 40 MG tablet Take 1 tablet by mouth daily Yes Rowe, Nicola Girt., MD   carvedilol (COREG) 6.25 MG tablet TAKE 1 TABLET TWICE A DAY  AS DIRECTED Yes Nicholaus Bloom, Welden Hausmann J, PA-C   omeprazole (PRILOSEC) 20 MG delayed release capsule Take 1 capsule by mouth daily Yes Hilton, Paige G, APRN - NP   losartan (COZAAR) 100 MG tablet Take 1 tablet by mouth daily Yes [provider]   ticagrelor (BRILINTA) 90 MG TABS tablet Take 1 tablet by mouth 2 times daily Yes Dalmau, Iona Coach, PA-C   fluticasone (FLONASE) 50 MCG/ACT nasal spray 1 spray by Each Nostril route daily Yes [provider]   nitroGLYCERIN (NITROSTAT) 0.4 MG SL tablet as directed Sublingual 1 tab under tongue q 5 minutes x 3 doses chest pain doe not help call 911  Patient taking differently: Place 1 tablet under the tongue every 5 minutes as needed for Chest pain  as directed Sublingual 1 tab under tongue q 5 minutes x 3 doses chest pain doe not help call 911 Yes Rowe, Nicola Girt., MD   Semaglutide, 2 MG/DOSE, (OZEMPIC, 2 MG/DOSE,) 8 MG/3ML SOPN Inject 2 mg into the skin once a week Yes Phylliss Bob, Nicola Girt., MD   aspirin 81 MG EC tablet Take 1 tablet by mouth daily Yes [provider]   Coenzyme Q10 (COQ10 PO) Take 200 mg by mouth daily Yes [provider]   diphenhydrAMINE-APAP, sleep, (TYLENOL PM EXTRA STRENGTH) 25-500 MG tablet 2 tablets as needed for Sleep Yes Rsfh Automatic Reconciliation, Rsfh, MD   Latanoprost (XELPROS) 0.005 % EMUL Place 1 drop into both eyes every evening  Rsfh Automatic Reconciliation, Rsfh, MD        HISTORY    Past Medical History:   Diagnosis Date    Eye pressure     GERD (gastroesophageal reflux disease)     Heart attack ([facility])     Hyperlipidemia     Hypertension     Prediabetes      Past Surgical History:   Procedure Laterality Date    BACK SURGERY  1991    CARDIAC PROCEDURE N/A 08/21/2021    Left heart cath performed by Arnette Felts, MD at [facility]    CARDIAC PROCEDURE N/A 08/21/2021    Insert stent  des coronary performed by Denzil Magnuson, MD at [facility]    CAROTID STENT  01/2018    KNEE CARTILAGE SURGERY      TONSILLECTOMY       Family History   Problem Relation Age of Onset    Alzheimer's Disease Mother         TIA    Heart Failure Father         DM    Heart Failure Maternal Grandmother     No Known Problems Maternal Grandfather     No Known Problems Paternal Grandmother     No Known Problems Paternal Grandfather      Social History     Tobacco Use    Smoking status: Former     Current packs/day: 0.00     Average packs/day: 0.3 packs/day for 0.5 years (0.1 ttl pk-yrs)     Types: Cigarettes     Start date: 08/1981     Quit date: 1984     Years since quitting: 40.0    Smokeless tobacco: Never   Vaping Use    Vaping Use: Never used   Substance Use Topics    Alcohol use: Yes     Alcohol/week:  4.0 standard drinks of alcohol     Types: 4 Shots of liquor per week    Drug use: Never       REVIEW OF SYSTEMS  Other than what was mentioned in the history of present illness, a thorough 11 system review of system was done and was negative.    EXAMINATION    BP 130/74 (Site: Left Upper Arm, Position: Sitting, Cuff Size: Medium Adult)   Pulse 76   Temp 98.1 F (36.7 C)   Ht 1.93 m (6' 3.98")   Wt 122.2 kg (269 lb 6.4 oz)   SpO2 96%   BMI 32.81 kg/m   Wt Readings from Last 3 Encounters:   02/20/22 122.2 kg (269 lb 6.4 oz)   02/06/22 120.7 kg (266 lb)   02/05/22 120.7 kg (266 lb)       GENERAL APPEARANCE: normal, pleasant, in no acute distress.   HEAD: normocephalic, atraumatic.   HEART: regular rate and rhythm, , no murmurs, rubs, gallops.   LUNGS: clear to auscultation bilaterally.   SKIN: mild contusion midline lumbar spine  BACK: lumbar spine and SI joints non-tender to palpation; positive SLR bilaterally, RIGHT > LEFT  NEUROLOGIC: nonfocal, alert and oriented, cognitive exam grossly normal    ASSESSMENTS/PLAN  1. Fall on same level from slipping, tripping or stumbling, initial encounter  Although patient denies back pain, symptoms consistent with bilateral sciatica. Reviewed past lumbar MRI from 2021. As there was a known fall, I do feel updated MRI lumbar spine is warranted. Will order and obtain. He will be notified of results once received. He can continue Tylenol prn. He v/u.    - MRI LUMBAR SPINE WO CONTRAST; Future    2. Bilateral sciatica  This is a new, uncontrolled problem. Although patient denies back pain, symptoms consistent with bilateral sciatica. Reviewed past lumbar MRI from 2021. As there was a known fall, I do feel updated MRI lumbar spine is warranted. Will order and obtain. He will be notified of results once received. He can continue Tylenol prn. He v/u.    - MRI LUMBAR SPINE WO CONTRAST; Future    3. History of laminectomy    - MRI LUMBAR SPINE WO CONTRAST; Future  Current  Outpatient Medications   Medication Sig Dispense Refill    latanoprost (XALATAN) 0.005 % ophthalmic solution       rosuvastatin (CRESTOR) 40 MG tablet Take 1 tablet by mouth daily 90 tablet 3    carvedilol (COREG) 6.25 MG tablet TAKE 1 TABLET TWICE A DAY  AS DIRECTED 180 tablet 1    omeprazole (PRILOSEC) 20 MG delayed release capsule Take 1 capsule by mouth daily 90 capsule 1    losartan (COZAAR) 100 MG tablet Take 1 tablet by mouth daily      ticagrelor (BRILINTA) 90 MG TABS tablet Take 1 tablet by mouth 2 times daily 180 tablet 3    fluticasone (FLONASE) 50 MCG/ACT nasal spray 1 spray by Each Nostril route daily      nitroGLYCERIN (NITROSTAT) 0.4 MG SL tablet as directed Sublingual 1 tab under tongue q 5 minutes x 3 doses chest pain doe not help call 911 (Patient taking differently: Place 1 tablet under the tongue every 5 minutes as needed for Chest pain as directed Sublingual 1 tab under tongue q 5 minutes x 3 doses chest pain doe not help call 911) 25 tablet 5    Semaglutide, 2 MG/DOSE, (OZEMPIC, 2 MG/DOSE,) 8 MG/3ML SOPN Inject 2 mg into the skin once a week 9 mL 3    aspirin 81 MG EC tablet Take 1 tablet by mouth daily      Coenzyme Q10 (COQ10 PO) Take 200 mg by mouth daily      diphenhydrAMINE-APAP, sleep, (TYLENOL PM EXTRA STRENGTH) 25-500 MG tablet 2 tablets as needed for Sleep      Latanoprost (XELPROS) 0.005 % EMUL Place 1 drop into both eyes every evening       No current facility-administered medications for this visit.          No follow-ups on file.      Dana Allan, PA-C

## 2022-02-21 ENCOUNTER — Inpatient Hospital Stay: Admit: 2022-02-21 | Payer: MEDICARE | Primary: Family Medicine

## 2022-02-21 ENCOUNTER — Encounter: Payer: MEDICARE | Primary: Family Medicine

## 2022-02-21 DIAGNOSIS — M48061 Spinal stenosis, lumbar region without neurogenic claudication: Secondary | ICD-10-CM

## 2022-02-21 DIAGNOSIS — W010XXA Fall on same level from slipping, tripping and stumbling without subsequent striking against object, initial encounter: Secondary | ICD-10-CM

## 2022-02-24 ENCOUNTER — Encounter: Payer: MEDICARE | Primary: Family Medicine

## 2022-02-25 ENCOUNTER — Telehealth

## 2022-02-25 NOTE — Telephone Encounter (Signed)
Per [name], needs neurosurgery consult. He wants you to recommend who he should see.

## 2022-02-25 NOTE — Telephone Encounter (Signed)
referral

## 2022-02-25 NOTE — Addendum Note (Signed)
Addended by: Azzie Roup on: 02/25/2022 12:45 PM     Modules accepted: Orders

## 2022-02-26 NOTE — Telephone Encounter (Signed)
Pt is a previous William [name] pt who has a new referral in for Dr. [name]. Pt last saw Dr. [name] in September of 2021. Can you review the office notes from those encounters and see if you would be willing to take the pt on. New MRI Lspine in the system as well. Thank you!

## 2022-03-20 ENCOUNTER — Encounter: Admit: 2022-03-20 | Discharge: 2022-03-20 | Payer: MEDICARE | Attending: Neurological Surgery | Primary: Family Medicine

## 2022-03-20 DIAGNOSIS — M48062 Spinal stenosis, lumbar region with neurogenic claudication: Secondary | ICD-10-CM

## 2022-03-20 MED ORDER — GABAPENTIN 300 MG PO CAPS
300 MG | ORAL_CAPSULE | Freq: Two times a day (BID) | ORAL | 2 refills | Status: AC
Start: 2022-03-20 — End: 2022-06-18

## 2022-03-20 NOTE — Progress Notes (Addendum)
NEUROSURGICAL CONSULTATION    Chief Complaint(s): Back pain, leg pain      CMA Notes:  Pt presents with low back pain that radiates into both of hs legs.    HPI:                    [name] is a 72 y.o. male presents complaining of a 3 to 4-week history of bilateral low back pain with pain that radiates lateral aspect of each thigh stopping at the knee made worse with walking and standing better with sitting and stooping over and laying down.  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                       Current Medications:    Current Outpatient Medications:     Latanoprost (XELPROS) 0.005 % EMUL, Place 1 drop into both eyes every evening, Disp: , Rfl:     rosuvastatin (CRESTOR) 40 MG tablet, Take 1 tablet by mouth daily, Disp: 90 tablet, Rfl: 3    carvedilol (COREG) 6.25 MG tablet, TAKE 1 TABLET TWICE A DAY  AS DIRECTED, Disp: 180 tablet, Rfl: 1    omeprazole (PRILOSEC) 20 MG delayed release capsule, Take 1 capsule by mouth daily, Disp: 90 capsule, Rfl: 1    losartan (COZAAR) 100 MG tablet, Take 1 tablet by mouth daily, Disp: , Rfl:     ticagrelor (BRILINTA) 90 MG TABS tablet, Take 1 tablet by mouth 2 times daily, Disp: 180 tablet, Rfl: 3    fluticasone (FLONASE) 50 MCG/ACT nasal spray, 1 spray by Each Nostril route daily, Disp: , Rfl:     nitroGLYCERIN (NITROSTAT) 0.4 MG SL tablet, as directed Sublingual 1 tab under tongue q 5 minutes x 3 doses chest pain doe not help call 911 (Patient taking differently: Place 1 tablet under the tongue every 5 minutes as needed for Chest pain as directed Sublingual 1 tab under tongue q 5 minutes x 3 doses chest pain doe not help call 911), Disp: 25 tablet, Rfl: 5    Semaglutide, 2 MG/DOSE, (OZEMPIC, 2 MG/DOSE,) 8 MG/3ML SOPN, Inject 2 mg into the skin once a week, Disp: 9 mL, Rfl: 3    aspirin 81 MG EC tablet, Take 1 tablet by mouth daily, Disp: , Rfl:     Coenzyme Q10 (COQ10 PO), Take 200 mg by mouth daily, Disp: , Rfl:     diphenhydrAMINE-APAP, sleep,  (TYLENOL PM EXTRA STRENGTH) 25-500 MG tablet, 2 tablets as needed for Sleep, Disp: , Rfl:     The patient has No Known Allergies.    Past Medical History  William [name]  has a past medical history of Eye pressure, GERD (gastroesophageal reflux disease), Heart attack ([place]), Hyperlipidemia, Hypertension, and Prediabetes.    Past Surgical History  The patient  has a past surgical history that includes Carotid stent (01/2018); back surgery (1991); Tonsillectomy; Knee cartilage surgery; Cardiac procedure (N/A, 08/21/2021); and Cardiac procedure (N/A, 08/21/2021).    Family History  This patient's family history includes Alzheimer's Disease in his mother; Heart Failure in his father and maternal grandmother; No Known Problems in his maternal grandfather, paternal grandfather, and paternal grandmother.    Social History  William [name]  reports that he quit smoking about 40 years ago. His smoking use included cigarettes. He started smoking about 40 years ago. He has a 0.1 pack-year smoking history. He has never used smokeless tobacco. He reports current alcohol use of  about 4.0 standard drinks of alcohol per week. He reports that he does not use drugs.    ROS:  Review of Systems    Objective:     Vitals:     Past Results:     Examination:          General Examination          GENERAL APPEARANCE: well-appearing individual, in no acute distress.           HEAD: normocephalic, atraumatic.           NECK: neck supple, full range of motion, non-tender to palpation, no paraspinal muscle spasms.          BACK: supple, full range of motion, spine nontender to palpation, no paraspinal muscle spasm.                          Neurological          MENTAL STATUS alert and oriented X 3, speech fluent, affect appropriate.           MOTOR STRENGTH: full symmetric strength throughout with normal bulk and tone.           SENSORY: intact to pinprick, light touch,  and position sense.           REFLEXES: Diminished distally            Summary of Radiographic  Findings: I independently interpreted the patient's imaging studies in the context of the clinical presentation.  MRI of the lumbar spine shows evidence for prior surgery at L5-S1 on the right.  At L3-4, the patient has developed very severe lumbar stenosis with severe cauda equina compression and near complete obliteration of the thecal sac.    Assessment & Plan:    The patient presents with exacerbation of a chronic problem characterized by neurogenic claudication secondary to severe lumbar stenosis.    Neurologic examination is as reported above.    I independently interpreted the imaging studies in the context of the clinical presentation.  Findings are as reported above.    We discussed treatment options which include medication management, conservative treatment measures, and major elective surgery with significant medical risk factors, including cardiac disease and a myocardial infarction with subsequent stent placement within the last year (August 21, 2021).  He reports that he has been instructed to take anticoagulants for 1 year subsequent to placement of the stents.    This patient would clearly benefit from lumbar decompression at L3-4.  However, he has considerable cardiac risks.  I will discuss his case with Dr. Tamala Julian, his cardiologist.  In the interim he will continue to use Tylenol as needed and I will prescribe a trial course of Neurontin 300 mg p.o. twice daily.     Diagnosis Orders   1. Spinal stenosis of lumbar region with neurogenic claudication          Mardee Postin, MD    Addendum: I communicated with Dr. Tamala Julian who informs me that it is okay for the patient to pause his anticoagulants and proceed with surgery.  I then discussed the case with the patient who understands and wishes to proceed.    I reviewed the rationale, risks and potential benefits of bilateral L3-4 lumbar decompression with the patient who understands and wishes to proceed. I explained to the patient that the goal of surgery  is to decompress neural elements offers the best chance for nerve recovery to occur  and for symptoms stop progressing and to improve. While nerves can recover fully, the timeframe varies on an individual basis and if the nerves have suffered permanent impairment as a consequence of the preoperative neural compression they may not recover fully or at all. Hence, no guarantees can be made.     Mardee Postin, MD    ADDENDUM:    I emailed Dr. Mauri [place] (internal to Epic) to inquire regarding proceeding with surgery.  He reported back that because [name] is more than 6 months out from stent placement it is OK to pause anticoagulation surrounding surgery.      I then spoke to [name] by phone and he told me that he would like to proceed with surgery.    Mardee Postin, MD

## 2022-03-22 ENCOUNTER — Telehealth

## 2022-03-22 NOTE — Telephone Encounter (Signed)
[place]: NEUROSURGERY  Fax [phone]      Patient Name: William [name]   DOB: 01/18/1951   Age: 72 y.o.   Sex: male  Patient SS: [id number]   Preferred Phone: [phone]     ______________________________________________________________________    Surgery:  04/16/22  Date:             Time:   Duration: 1.5  Facility:     Admitting Diagnosis:       Diagnosis Codes: .daig    Film Location:pacs    Status: []   IP      [x]  OP     []  Same Day Discharge is Planned           [x]  Elective  []  Urgent               Perioperative Clinic Visit Needed: []  Yes []  No    Procedure Codes:  shl 1    []  20552 []  22558* []  65784* []  22856 []  69629 []  52841*  []  20610 []  22585 []  32440 []  10272 []  53664* []  40347*  []  20930 []  22600* []  42595 []  63875 []  62252 []  64332  []  20931 []  95188* []  41660* []  63016* []  01093 []  23557*  []  32202 []  54270 []  22845 []  62376* []  28315 []  17616  []  07371 []  06269 []  22846* []  48546* []  27035 []  00938  []  22514 []  18299 []  22849* []  37169* []  67893 []  81017  []  22551 []  51025* []  22852* []  85277* []  82423 []  53614  []  22552 []  43154 []  00867 []  61950* []  93267 []  12458  []  22554 []  09983 []  22854 []  38250 []  53976 []  73419      []  37902        *Medicare IP only Code    []  ERAS      Operative Consent for: Bilateral L3/4 Lumbar Decompression    MD Performing: Mardee Postin, MD     MD NPI Number:   Assistant:     Assistant NPI Number:        Allergies: No Known Allergies  ______________________________________________________________________    Physician Name:      Primary Insurance Carrier:  @SUBNUM @          Member Name:  Member DOB:     Secondary Insurance Carrier:  Member Number:    Member Name:                                     Member DOB:  ______________________________________________________________________    Anesthesia Type: []  General []  MAC []  Neuroaxial  []  Local Only    Height:    Weight: @PATIENTWT @    ______________________________________________________________________                  SPINE SURGERY     None    DANEK:  [] Atlantis   []  Legacy   []  Divergence   []  Wave   []  Sextant []  Solera       []  Elevate   []   Spine   [] Infinity   []  ILOF/DLIF   []  Zevo   []  Voyager       []  Titan   []  Titan C  []  Modulus   []  AESCULAP ABC       []  SPINEOLOGY Elite   20930= What type of graft material:     Other:  Equipment: [x] C-Arm []  O-Arm [] Siemans C-Arm []  (MIS only)   []  (OPEN/MIS combo)   [x]  Microscope  []  Cellsaver  []  Nerve Monitoring   []  IGS Neuro Navigation  []  Other:    Headrest: []  3 Point   []  Horseshoe  []  Other:    Table: []  Jackson   []  Trumpf   []  Radiolucent  []  Other:     Removal of Hardware:      Patient Positioning: []  Supine  [x]  Prone []  Lateral Right Side Up   []  Lateral Left Side Up  []  Beanbag   Foley: []  Cath  []  Other:      MD Position: []  On Patient's Right [x]  On Patient's Left  []  Other:     DME: [] RSF DME  [] External DME   [] [place] Overlap Brace  [] TLSO [id number]     [] TLSO [id number]     [] LSO Custom Brace [id number]   [] [place] J L0174   [] [place] Multi Post Collar L0180   [] Bone Growth Stimulator-Spinal B7669101    [] LSO Custom [id number]   [] Other:     I certify that this prescribed equipment, its setup and any related patient education is medically indicated & both reasonable and necessary to accepted standards of medicine of the pt's condition. I certify that the information provided is true, accurate and complete to the best of my knowledge. I understand that any falsification, omission or concealment of material fact associated with billing this service may subject me to civil or criminal liability.   ______________________________________________________________________      [address]   Equipment: [] Selector  [] STEALTH [] Skull Clamps [] Navigation [] Microscope   [] Cellsaver  [] Subdural Drain [] Biopsy [] FS/permanent [] Ultrasound   [] Jelly doughnut [] Other:   Headrest: [] Sugita [] 3 Point Pins  [] Horseshoe [] Other:    Table:    Shunts: [] Codman Shunt  [] Medtronic  [] Foley Cath  [] Other:      Patient Positioning: [] Supine [] Lateral Right Side Up [] Lateral Left Side Up   [] Beanbag [] Prone [] Other:      MD Position: [] On Patient's Right  [] On Patient's Left  [] Other:        Submitted By: Mardee Postin, MD on 03/22/22 at 11:40 AM  ______________________________________________________________________    Patient Name: William [name]   DOB: 16-Mar-1950   Age: 72 y.o.   Sex: male  Patient ID: [id number]  Preferred Phone: [phone]   SSN: [id number]

## 2022-03-24 NOTE — Telephone Encounter (Signed)
Pt needs to hold brillinta for colonoscopy

## 2022-03-25 NOTE — Telephone Encounter (Addendum)
Pt was informed, letter was also faxed.

## 2022-04-05 ENCOUNTER — Encounter

## 2022-04-05 LAB — IRON AND TIBC
Iron % Saturation: 26 % (ref 20–40)
Iron: 87 ug/dL (ref 59–158)
TIBC: 332 ug/dL (ref 250–450)
UIBC: 245 ug/dL (ref 112.0–347.0)

## 2022-04-05 LAB — CBC WITH AUTO DIFFERENTIAL
Absolute Baso #: 0.1 10*3/uL (ref 0.0–0.2)
Absolute Eos #: 0.1 10*3/uL (ref 0.0–0.5)
Absolute Lymph #: 1.3 10*3/uL (ref 1.0–3.2)
Absolute Mono #: 0.6 10*3/uL (ref 0.3–1.0)
Basophils %: 0.8 % (ref 0.0–2.0)
Eosinophils %: 1.4 % (ref 0.0–7.0)
Hematocrit: 40 % (ref 38.0–52.0)
Hemoglobin: 13 g/dL (ref 13.0–17.3)
Immature Grans (Abs): 0.01 10*3/uL (ref 0.00–0.06)
Immature Granulocytes: 0.2 % (ref 0.0–0.6)
Lymphocytes: 22.4 % (ref 15.0–45.0)
MCH: 30.4 pg (ref 27.0–34.5)
MCHC: 32.5 g/dL (ref 32.0–36.0)
MCV: 93.7 fL (ref 84.0–100.0)
MPV: 11.2 fL (ref 7.2–13.2)
Monocytes: 10.7 % (ref 4.0–12.0)
NRBC Absolute: 0 10*3/uL (ref 0.000–0.012)
NRBC Automated: 0 % (ref 0.0–0.2)
Neutrophils %: 64.5 % (ref 42.0–74.0)
Neutrophils Absolute: 3.8 10*3/uL (ref 1.6–7.3)
Platelets: 171 10*3/uL (ref 140–440)
RBC: 4.27 x10e6/mcL (ref 4.00–5.60)
RDW: 12.3 % (ref 11.0–16.0)
WBC: 5.9 10*3/uL (ref 3.8–10.6)

## 2022-04-05 LAB — HEMOGLOBIN A1C
Est. Avg. Glucose, WB: 111
Est. Avg. Glucose-calculated: 118
Hemoglobin A1C: 5.5 % (ref 4.0–6.0)

## 2022-04-05 LAB — FERRITIN: Ferritin: 244.5 ng/mL (ref 30.0–400.0)

## 2022-04-07 NOTE — Other (Signed)
We will review labs at upcoming appointment.Call us if you need to reschedule.

## 2022-04-08 ENCOUNTER — Encounter: Admit: 2022-04-08 | Discharge: 2022-04-08 | Payer: MEDICARE | Attending: Family Medicine | Primary: Family Medicine

## 2022-04-08 DIAGNOSIS — I25119 Atherosclerotic heart disease of native coronary artery with unspecified angina pectoris: Secondary | ICD-10-CM

## 2022-04-08 NOTE — Progress Notes (Signed)
William [name] (DOB:  05-Aug-1950) is a 72 y.o. male, here for evaluation of the following chief complaint(s):  3 Month Follow-Up (Back surgery scheduled next week due to pinched nerve pain radiating to B/L legs )           ASSESSMENT/PLAN:  1. Atherosclerosis of native coronary artery of native heart with angina pectoris ([place])  2. Type 2 diabetes mellitus without complication, without long-term current use of insulin ([place])  3. Essential hypertension  4. Mixed hyperlipidemia    We reviewed labs and symptoms patient is cleared for back surgery.  Diabetes is controlled continue Ozempic CAD is asymptomatic.  Hypertension and hyperlipidemia are stable  Return in about 6 months (around 10/07/2022).       Subjective   SUBJECTIVE/OBJECTIVE:  HPI   Patient is here for follow-up of lab work he is doing really well he is not having any chest pain chest pressure chest heaviness.  He feels good.  Weight has been stable no side effects with Ozempic  Prior to Admission medications    Medication Sig Start Date End Date Taking? Authorizing Provider   Multiple Vitamins-Minerals (CENTRUM VITAMINTS PO) Take by mouth   Yes [provider]   gabapentin (NEURONTIN) 300 MG capsule Take 1 capsule by mouth 2 times daily for 90 days. 03/20/22 06/18/22 Yes Steichen, Chrystie Nose, MD   rosuvastatin (CRESTOR) 40 MG tablet Take 1 tablet by mouth daily 01/13/22  Yes Rowe, Anselmo Pickler., MD   carvedilol (COREG) 6.25 MG tablet TAKE 1 TABLET TWICE A DAY  AS DIRECTED 11/25/21  Yes Carol Ada, PA-C   omeprazole (PRILOSEC) 20 MG delayed release capsule Take 1 capsule by mouth daily 10/18/21  Yes Hilton, Hessie Diener, APRN - NP   losartan (COZAAR) 100 MG tablet Take 1 tablet by mouth daily   Yes [provider]   ticagrelor (BRILINTA) 90 MG TABS tablet Take 1 tablet by mouth 2 times daily 08/22/21  Yes Dalmau, Valorie Roosevelt, PA-C   fluticasone (FLONASE) 50 MCG/ACT nasal spray 1 spray by Each Nostril route daily   Yes [provider]    nitroGLYCERIN (NITROSTAT) 0.4 MG SL tablet as directed Sublingual 1 tab under tongue q 5 minutes x 3 doses chest pain doe not help call 911  Patient taking differently: Place 1 tablet under the tongue every 5 minutes as needed for Chest pain as directed Sublingual 1 tab under tongue q 5 minutes x 3 doses chest pain doe not help call 911 06/06/21  Yes Rowe, Anselmo Pickler., MD   Semaglutide, 2 MG/DOSE, (OZEMPIC, 2 MG/DOSE,) 8 MG/3ML SOPN Inject 2 mg into the skin once a week 06/06/21  Yes Rowe, Anselmo Pickler., MD   aspirin 81 MG EC tablet Take 1 tablet by mouth daily   Yes [provider]   Coenzyme Q10 (COQ10 PO) Take 200 mg by mouth daily   Yes [provider]   diphenhydrAMINE-APAP, sleep, (TYLENOL PM EXTRA STRENGTH) 25-500 MG tablet 2 tablets as needed for Sleep   Yes Rsfh Automatic Reconciliation, Rsfh, MD   Latanoprost (XELPROS) 0.005 % EMUL Place 1 drop into both eyes every evening   Yes Rsfh Automatic Reconciliation, Rsfh, MD       No Known Allergies    Past Medical History:   Diagnosis Date    Eye pressure     GERD (gastroesophageal reflux disease)     Heart attack ([place])     Hyperlipidemia  Hypertension     Prediabetes     Sleep apnea     Type 2 diabetes mellitus with hyperglycemia, without long-term current use of insulin ([place]) 04/08/2022       Past Surgical History:   Procedure Laterality Date    BACK SURGERY  1991    CARDIAC PROCEDURE N/A 08/21/2021    Left heart cath performed by Denzil Magnuson, MD at [facility]    CARDIAC PROCEDURE N/A 08/21/2021    Insert stent des coronary performed by Denzil Magnuson, MD at [facility]    CARDIAC SURGERY  December 2019    Stint placement    CAROTID STENT  01/2018    KNEE CARTILAGE SURGERY      TONSILLECTOMY           Family History   Problem Relation Age of Onset    Alzheimer's Disease Mother         TIA    Heart Failure Father         DM    Diabetes Father         Died in [place] Failure Maternal Grandmother      No Known Problems Maternal Grandfather     No Known Problems Paternal Grandmother     No Known Problems Paternal Grandfather        Social History     Tobacco Use    Smoking status: Former     Current packs/day: 0.00     Average packs/day: 0.6 packs/day for 26.6 years (15.0 ttl pk-yrs)     Types: Cigarettes     Start date: 10/07/1966     Quit date: 1984     Years since quitting: 40.1    Smokeless tobacco: Never   Vaping Use    Vaping Use: Never used   Substance Use Topics    Alcohol use: Yes     Alcohol/week: 6.0 standard drinks of alcohol     Types: 6 Drinks containing 0.5 oz of alcohol per week    Drug use: Never          Review of Systems   All other systems reviewed and are negative.       Objective       Vitals:    04/08/22 1310   BP: 128/72   Site: Left Upper Arm   Position: Sitting   Cuff Size: Medium Adult   Pulse: 67   Temp: 97.4 F (36.3 C)   TempSrc: Oral   SpO2: 98%   Weight: 124.7 kg (275 lb)   Height: 1.93 m (6' 3.98")        BP Readings from Last 3 Encounters:   04/08/22 128/72   02/20/22 130/74   01/16/22 130/70     Wt Readings from Last 3 Encounters:   04/08/22 124.7 kg (275 lb)   03/20/22 121.6 kg (268 lb)   02/20/22 122.2 kg (269 lb 6.4 oz)       Physical Exam  Vitals and nursing note reviewed.   Constitutional:       General: He is not in acute distress.  HENT:      Head: Normocephalic and atraumatic.   Cardiovascular:      Rate and Rhythm: Normal rate and regular rhythm.      Pulses: Normal pulses.      Heart sounds: Normal heart sounds. No murmur heard.  Pulmonary:      Breath sounds: Normal breath  sounds.   Abdominal:      General: Abdomen is flat. Bowel sounds are normal.      Palpations: Abdomen is soft.   Neurological:      General: No focal deficit present.      Mental Status: He is alert and oriented to person, place, and time.                Orders Only on 04/05/2022   Component Date Value Ref Range Status    Hemoglobin A1C 04/05/2022 5.5  4.0 - 6.0 % Final    Comment: HEMOGLOBIN A1C  INTERPRETATION:    The following arbitrary ranges may be used for interpretation of the results.  However, factors such as duration of diabetes, adherence to therapy, and  patient age should also be considered in assessing degree of blood glucose  control.    Hemoglobin A1C                 Avg. Blood Sugar  --------------------------------------------------------------  6%                           135 mg/dL  7%                           170 mg/dL  8%                           205 mg/dL  9%                           240 mg/dL  10%                          275 mg/dL    ======================================================    A1C                      Glucose Control  ----------------------------------------------------------------  < 6.0 %                   Normal  6.0 - 6.9 %               Abnormal  7.0 - 7.9 %               Sub-Optimal Control  > 8.0 %                   Inadequate Control      Est. Avg. Glucose, WB 04/05/2022 111   Final    Est. Avg. Glucose-calculated 04/05/2022 118   Final    Ferritin 04/05/2022 244.5  30.0 - 400.0 ng/mL Final    Iron 04/05/2022 87  59 - 158 mcg/dL Final    UIBC 04/05/2022 245.0  112.0 - 347.0 mcg/dL Final    TIBC 04/05/2022 332  250 - 450 mcg/dL Final    Iron % Saturation 04/05/2022 26  20 - 40 % Final    WBC 04/05/2022 5.9  3.8 - 10.6 x10e3/mcL Final    RBC 04/05/2022 4.27  4.00 - 5.60 x10e6/mcL Final    Hemoglobin 04/05/2022 13.0  13.0 - 17.3 g/dL Final    Hematocrit 04/05/2022 40.0  38.0 - 52.0 % Final    MCV 04/05/2022 93.7  84.0 - 100.0 fL Final    MCH 04/05/2022 30.4  27.0 - 34.5 pg  Final    MCHC 04/05/2022 32.5  32.0 - 36.0 g/dL Final    RDW 04/05/2022 12.3  11.0 - 16.0 % Final    Platelets 04/05/2022 171  140 - 440 x10e3/mcL Final    MPV 04/05/2022 11.2  7.2 - 13.2 fL Final    NRBC Automated 04/05/2022 0.0  0.0 - 0.2 % Final    NRBC Absolute 04/05/2022 0.000  0.000 - 0.012 x10e3/mcL Final    Neutrophils % 04/05/2022 64.5  42.0 - 74.0 % Final    Lymphocytes 04/05/2022  22.4  15.0 - 45.0 % Final    Monocytes 04/05/2022 10.7  4.0 - 12.0 % Final    Eosinophils % 04/05/2022 1.4  0.0 - 7.0 % Final    Basophils % 04/05/2022 0.8  0.0 - 2.0 % Final    Neutrophils Absolute 04/05/2022 3.8  1.6 - 7.3 x10e3/mcL Final    Absolute Lymph # 04/05/2022 1.3  1.0 - 3.2 x10e3/mcL Final    Absolute Mono # 04/05/2022 0.6  0.3 - 1.0 x10e3/mcL Final    Absolute Eos # 04/05/2022 0.1  0.0 - 0.5 x10e3/mcL Final    Absolute Baso # 04/05/2022 0.1  0.0 - 0.2 x10e3/mcL Final    Immature Granulocytes 04/05/2022 0.2  0.0 - 0.6 % Final    Immature Grans (Abs) 04/05/2022 0.01  0.00 - 0.06 x10e3/mcL Final      No results found for this visit on 04/08/22.              An electronic signature was used to authenticate this note.    --Alexis Goodell, MD

## 2022-04-14 NOTE — Progress Notes (Signed)
Pre Procedure Patient Instructions     Procedure Location hospital:[facility]: [address] [place] off at Outpatient Services to the left of the main hospital entrance and proceed to the gold elevator on your left (past the security desk) to the 2nd floor Surgery Reception desk.   Procedure Date: 04/16/22  Arrival Time: 0600 AM    Your doctor determines your scheduled start time.  Depending on the facility where your procedure is taking place and the scheduled start time, you may be required to arrive up to 2 hours prior.  This is to allow time for registration, your preop assessment, any day of procedure testing and to meet with your care teams members.  This also allows your procedure to be performed earlier should there be a cancellation that day.  We appreciate your patience as we work to provide you with excellent service.    Medications:  Medication to be taken the morning of surgery with a few sips of water only: Carvedilol, Gabapentin, Omeprazole.   Hold or reduce the dosage of the following medication, as discussed: Do not take Losartan on day of surgery.Hold Ozempic (Semaglutide) 7 days prior to your procedure.    Stop all supplements, vitamins and herbal remedies one week prior to your procedure, unless your doctor told you to continue taking.  Do not take over the counter pain medications except plain Tylenol or Acetaminophen unless your doctor told you to do so.  If you are taking blood thinners, call the doctor performing your procedure and prescribing physician for instructions on when to stop.      Procedure Preparation    Diet Restrictions:Your procedure will be cancelled if you do not follow these instructions. Clear liquids ONLY from 12:00am to 11:59pm the day before your procedure (water, black coffee with no milk or cream, juice without pulp, plain non red or purple jello, soda, Ensure Clear, non-red or purple Gatorade, chicken broth, beef broth and  bone broth without noodles) and then NO food or drink including gum or mints after 12:00am the day of your procedure even if your doctor told you to drink fluids.       When taking Ozempic your stomach doesn't empty as quickly and can cause you to inhale your stomach contents while under anesthesia.  Your anesthesia provider will discuss these risks with you further on the day of your procedure.  Even when following the fasting and medication instructions you may have symptoms such as nausea, vomiting, and/or bloating on the day of your procedure.  Depending on your symptoms, the anesthesiologists and surgeon may decide that delaying or cancelling surgery may be the safest decision for you.    If you have questions on the day of your procedure call [place] at [phone].    Skin Preparation:  Wash with Hibiclens or an antibacterial soap (e.g. Dial soap) the night before and morning of procedure., Using a clean washcloth, wash from neck to toes for 3 minutes. Gently clean the area where your surgery will be done thoroughly., and Do not use Hibiclens on or near your face, eyes, ears or head.    Do not put on any deodorants, lotions, powders, or oils on the day of procedure.  Be sure to put on clean clothes.    Other Preparation:  Call your doctor right away if you get any wounds, cuts, scrapes, scabs, rashes, bug bites at or near your procedure site or  if you have any fever, cold or flu symptoms      Day of Procedure Patient Instruction:  Do not smoke, vape, chew tobacco, drink alcohol or use recreational drugs on the day of your procedure  Remove all jewelry, piercings, and metal accessories  Do not wear artificial nails and only clear nail polish on natural nails. Nails must be trimmed to fingertip length to make sure the oxygen probe fits properly and to avoid injury  Do not wear artificial eye lashes because they can cause dry eyes, eye scratches, eye infections and allergic  reactions  Do not use hair extensions with metal clips or hairstyles near the back of your neck, as it can make it difficult to safely manage your breathing during anesthesia  If your hair is tightly braided or styled in a complex way, it may need to be undone for your safety  Do not wear contacts, tampons, make-up, lotions, creams, powders, fragrances or deodorant  Do not bring valuables or money  Bring a copy of your Living Will and/or Medical Durable Power of Attorney if you have one  Bring a list of current medications including name and dosage  Bring a picture ID and insurance card     Bring your CPAP or BiPAP machine          If you are going home the same day as your procedure, a support person should accompany you to the facility and must transport you home.  If you plan to take public transportation of any sort, your support person must accompany you home.  You will need someone to stay with you for 24 hours after your procedure with sedation of any kind.     The information and visitor policy was reviewed with you during your Pre-Admission Testing interview and you verbalized understanding. If you have any additional questions please contact [phone]    To pre-register for your procedure please call [phone] Option 1    For financial questions regarding your procedure at a [facility] facility, please contact [phone]    For financial questions regarding anesthesia at a [facility] facility, please  contact [phone]    For MyChart Patient Portal help please call [phone]

## 2022-04-16 ENCOUNTER — Inpatient Hospital Stay
Admit: 2022-04-16 | Discharge: 2022-04-17 | Disposition: A | Discharge status: Home / Self Care | Payer: Medicare Other | Attending: Neurological Surgery | Admitting: Neurological Surgery

## 2022-04-16 ENCOUNTER — Observation Stay: Admit: 2022-04-16 | Payer: MEDICARE | Primary: Family Medicine

## 2022-04-16 DIAGNOSIS — M48062 Spinal stenosis, lumbar region with neurogenic claudication: Secondary | ICD-10-CM

## 2022-04-16 LAB — POCT GLUCOSE: POC Glucose: 103 mg/dL (ref 65.0–110.0)

## 2022-04-16 MED ORDER — GENTAMICIN IN SALINE 0.8-0.9 MG/ML-% IV SOLN
INTRAVENOUS | Status: DC | PRN
Start: 2022-04-16 — End: 2022-04-16
  Administered 2022-04-16: 14:00:00 80

## 2022-04-16 MED ORDER — EPHEDRINE SULFATE (PRESSORS) 50 MG/ML IV SOLN
50 MG/ML | INTRAVENOUS | Status: DC | PRN
  Administered 2022-04-16 (×3): 10 via INTRAVENOUS
  Administered 2022-04-16: 14:00:00 20 via INTRAVENOUS

## 2022-04-16 MED ORDER — HYDROMORPHONE HCL 1 MG/ML IJ SOLN
1 | INTRAMUSCULAR | Status: DC | PRN
Start: 2022-04-16 — End: 2022-04-16
  Administered 2022-04-16 (×2): 0.25 mg via INTRAVENOUS

## 2022-04-16 MED ORDER — PROPOFOL 200 MG/20ML IV EMUL
200 | INTRAVENOUS | Status: AC
Start: 2022-04-16 — End: ?

## 2022-04-16 MED ORDER — CARVEDILOL 6.25 MG PO TABS
6.25 MG | Freq: Two times a day (BID) | ORAL | Status: DC
Start: 2022-04-16 — End: 2022-04-17
  Administered 2022-04-17 (×2): 6.25 mg via ORAL

## 2022-04-16 MED ORDER — KETAMINE HCL 50 MG/5ML IJ SOSY
50 | INTRAMUSCULAR | Status: AC
Start: 2022-04-16 — End: ?

## 2022-04-16 MED ORDER — BISACODYL 5 MG PO TBEC
5 MG | Freq: Every day | ORAL | Status: DC
Start: 2022-04-16 — End: 2022-04-17
  Administered 2022-04-17: 14:00:00 5 mg via ORAL

## 2022-04-16 MED ORDER — GLYCOPYRROLATE PF 0.4 MG/2ML IJ SOSY
0.4 | INTRAMUSCULAR | Status: AC
Start: 2022-04-16 — End: ?

## 2022-04-16 MED ORDER — CEFAZOLIN SODIUM-DEXTROSE 2-3 GM-%(50ML) IV SOLR
2000 | INTRAVENOUS | Status: AC
Start: 2022-04-16 — End: 2022-04-16

## 2022-04-16 MED ORDER — FENTANYL CITRATE (PF) 100 MCG/2ML IJ SOLN
100 | INTRAMUSCULAR | Status: AC
Start: 2022-04-16 — End: ?

## 2022-04-16 MED ORDER — FENTANYL CITRATE (PF) 100 MCG/2ML IJ SOLN
100 MCG/2ML | INTRAMUSCULAR | Status: DC | PRN
  Administered 2022-04-16 (×2): 50 via INTRAVENOUS

## 2022-04-16 MED ORDER — LACTATED RINGERS IV SOLN
INTRAVENOUS | Status: DC
Start: 2022-04-16 — End: 2022-04-17
  Administered 2022-04-16: 16:00:00 via INTRAVENOUS

## 2022-04-16 MED ORDER — FLUTICASONE PROPIONATE 50 MCG/ACT NA SUSP
50 MCG/ACT | Freq: Every day | NASAL | Status: DC
Start: 2022-04-16 — End: 2022-04-17
  Administered 2022-04-16 – 2022-04-17 (×2): 1 via NASAL

## 2022-04-16 MED ORDER — HALOPERIDOL LACTATE 5 MG/ML IJ SOLN
5 | Freq: Once | INTRAMUSCULAR | Status: DC | PRN
Start: 2022-04-16 — End: 2022-04-16

## 2022-04-16 MED ORDER — CYCLOBENZAPRINE HCL 10 MG PO TABS
10 MG | Freq: Two times a day (BID) | ORAL | Status: DC | PRN
Start: 2022-04-16 — End: 2022-04-17
  Administered 2022-04-16: 21:00:00 10 mg via ORAL

## 2022-04-16 MED ORDER — ONDANSETRON HCL 4 MG/2ML IJ SOLN
42 MG/2ML | Freq: Four times a day (QID) | INTRAMUSCULAR | Status: DC | PRN
Start: 2022-04-16 — End: 2022-04-17

## 2022-04-16 MED ORDER — CEFAZOLIN 3000 MG IN NS 100 ML IVPB
Freq: Three times a day (TID) | Status: AC
Start: 2022-04-16 — End: 2022-04-17
  Administered 2022-04-16 – 2022-04-17 (×2): 3000 mg via INTRAVENOUS

## 2022-04-16 MED ORDER — VANCOMYCIN HCL 1 G IV SOLR
1 | INTRAVENOUS | Status: DC | PRN
Start: 2022-04-16 — End: 2022-04-16
  Administered 2022-04-16: 15:00:00 1000 via TOPICAL

## 2022-04-16 MED ORDER — LIDOCAINE HCL (PF) 2 % IJ SOLN
2 % | INTRAMUSCULAR | Status: DC | PRN
  Administered 2022-04-16: 13:00:00 80 via INTRAVENOUS

## 2022-04-16 MED ORDER — SUCCINYLCHOLINE CHLORIDE 20 MG/ML IJ SOLN
20 | INTRAMUSCULAR | Status: AC
Start: 2022-04-16 — End: ?

## 2022-04-16 MED ORDER — LIDOCAINE HCL (PF) 1 % IJ SOLN
1 | Freq: Once | INTRAMUSCULAR | Status: AC | PRN
Start: 2022-04-16 — End: 2022-04-16

## 2022-04-16 MED ORDER — ACETAMINOPHEN 500 MG PO TABS
500 | ORAL | Status: AC
Start: 2022-04-16 — End: 2022-04-16
  Administered 2022-04-16: 12:00:00 1000 via ORAL

## 2022-04-16 MED ORDER — ACETAMINOPHEN 325 MG PO TABS
325 MG | Freq: Four times a day (QID) | ORAL | Status: DC
Start: 2022-04-16 — End: 2022-04-17
  Administered 2022-04-17 (×2): 650 mg via ORAL

## 2022-04-16 MED ORDER — BENZOCAINE-MENTHOL 6-10 MG MT LOZG
6-10 MG | OROMUCOSAL | Status: DC | PRN
Start: 2022-04-16 — End: 2022-04-17

## 2022-04-16 MED ORDER — DIPHENHYDRAMINE HCL 50 MG/ML IJ SOLN
50 | Freq: Once | INTRAMUSCULAR | Status: DC | PRN
Start: 2022-04-16 — End: 2022-04-16

## 2022-04-16 MED ORDER — NORMAL SALINE FLUSH 0.9 % IV SOLN
0.9 | INTRAVENOUS | Status: DC | PRN
Start: 2022-04-16 — End: 2022-04-16

## 2022-04-16 MED ORDER — MAGNESIUM SULFATE 50 % IJ SOLN
50 | INTRAMUSCULAR | Status: AC
Start: 2022-04-16 — End: ?

## 2022-04-16 MED ORDER — ONDANSETRON HCL 4 MG/2ML IJ SOLN
4 | Freq: Once | INTRAMUSCULAR | Status: DC | PRN
Start: 2022-04-16 — End: 2022-04-16

## 2022-04-16 MED ORDER — SODIUM CHLORIDE 0.9 % IV SOLN
0.9 % | INTRAVENOUS | Status: DC | PRN
Start: 2022-04-16 — End: 2022-04-17

## 2022-04-16 MED ORDER — LACTATED RINGERS IV SOLN
INTRAVENOUS | Status: DC
Start: 2022-04-16 — End: 2022-04-16

## 2022-04-16 MED ORDER — LORAZEPAM 2 MG/ML IJ SOLN
2 | Freq: Once | INTRAMUSCULAR | Status: AC | PRN
Start: 2022-04-16 — End: 2022-04-16
  Administered 2022-04-16: 16:00:00 0.5 mg via INTRAVENOUS

## 2022-04-16 MED ORDER — NEOSTIGMINE METHYLSULFATE 5 MG/5ML IV SOSY
5 | INTRAVENOUS | Status: AC
Start: 2022-04-16 — End: ?

## 2022-04-16 MED ORDER — SUCCINYLCHOLINE CHLORIDE 20 MG/ML IJ SOLN
20 MG/ML | INTRAMUSCULAR | Status: DC | PRN
  Administered 2022-04-16: 13:00:00 160 via INTRAVENOUS

## 2022-04-16 MED ORDER — MORPHINE SULFATE (PF) 4 MG/ML IJ SOLN
4 MG/ML | INTRAMUSCULAR | Status: DC | PRN
Start: 2022-04-16 — End: 2022-04-17
  Administered 2022-04-16: 19:00:00 2 mg via INTRAVENOUS

## 2022-04-16 MED ORDER — NITROGLYCERIN 0.4 MG SL SUBL
0.4 MG | SUBLINGUAL | Status: DC | PRN
Start: 2022-04-16 — End: 2022-04-17

## 2022-04-16 MED ORDER — POLYETHYLENE GLYCOL 3350 17 G PO PACK
17 g | Freq: Every day | ORAL | Status: DC
Start: 2022-04-16 — End: 2022-04-17
  Administered 2022-04-17: 14:00:00 17 g via ORAL

## 2022-04-16 MED ORDER — SODIUM CHLORIDE 0.9 % IV SOLN
0.9 | INTRAVENOUS | Status: DC | PRN
Start: 2022-04-16 — End: 2022-04-16

## 2022-04-16 MED ORDER — DEXAMETHASONE SODIUM PHOSPHATE 4 MG/ML IJ SOLN
4 | Freq: Once | INTRAMUSCULAR | Status: DC
Start: 2022-04-16 — End: 2022-04-16

## 2022-04-16 MED ORDER — ROSUVASTATIN CALCIUM 40 MG PO TABS
40 MG | Freq: Every day | ORAL | Status: DC
Start: 2022-04-16 — End: 2022-04-17
  Administered 2022-04-17: 14:00:00 40 mg via ORAL

## 2022-04-16 MED ORDER — IPRATROPIUM-ALBUTEROL 0.5-2.5 (3) MG/3ML IN SOLN
Freq: Once | RESPIRATORY_TRACT | Status: DC | PRN
Start: 2022-04-16 — End: 2022-04-16

## 2022-04-16 MED ORDER — ONDANSETRON 4 MG PO TBDP
4 MG | Freq: Three times a day (TID) | ORAL | Status: DC | PRN
Start: 2022-04-16 — End: 2022-04-17

## 2022-04-16 MED ORDER — HYDROMORPHONE HCL 1 MG/ML IJ SOLN
1 | INTRAMUSCULAR | Status: DC | PRN
Start: 2022-04-16 — End: 2022-04-16
  Administered 2022-04-16: 16:00:00 0.5 mg via INTRAVENOUS

## 2022-04-16 MED ORDER — LABETALOL HCL 5 MG/ML IV SOLN
5 | INTRAVENOUS | Status: DC | PRN
Start: 2022-04-16 — End: 2022-04-16

## 2022-04-16 MED ORDER — NORMAL SALINE FLUSH 0.9 % IV SOLN
0.9 | Freq: Two times a day (BID) | INTRAVENOUS | Status: DC
Start: 2022-04-16 — End: 2022-04-16

## 2022-04-16 MED ORDER — BENZOCAINE-MENTHOL 15-3.6 MG MT LOZG
OROMUCOSAL | Status: DC | PRN
Start: 2022-04-16 — End: 2022-04-16

## 2022-04-16 MED ORDER — OXYCODONE-ACETAMINOPHEN 5-325 MG PO TABS
5-325 | ORAL | Status: AC
Start: 2022-04-16 — End: 2022-04-16
  Administered 2022-04-16: 17:00:00 1 via ORAL

## 2022-04-16 MED ORDER — SODIUM CHLORIDE 0.9 % IV SOLN
0.9 % | INTRAVENOUS | Status: DC | PRN
  Administered 2022-04-16: 14:00:00 50 via INTRAVENOUS

## 2022-04-16 MED ORDER — NORMAL SALINE FLUSH 0.9 % IV SOLN
0.9 % | Freq: Two times a day (BID) | INTRAVENOUS | Status: DC
Start: 2022-04-16 — End: 2022-04-17

## 2022-04-16 MED ORDER — NORMAL SALINE FLUSH 0.9 % IV SOLN
0.9 % | INTRAVENOUS | Status: DC | PRN
Start: 2022-04-16 — End: 2022-04-17
  Administered 2022-04-16: 19:00:00 10 mL via INTRAVENOUS

## 2022-04-16 MED ORDER — KETAMINE HCL 50 MG/5ML IJ SOSY
50 MG/5ML | INTRAMUSCULAR | Status: DC | PRN
  Administered 2022-04-16: 13:00:00 30 via INTRAVENOUS
  Administered 2022-04-16: 14:00:00 10 via INTRAVENOUS

## 2022-04-16 MED ORDER — LATANOPROST 0.005 % OP EMUL
0.005 | Freq: Every evening | OPHTHALMIC | Status: DC
Start: 2022-04-16 — End: 2022-04-16

## 2022-04-16 MED ORDER — EPHEDRINE SULFATE (PRESSORS) 50 MG/ML IV SOLN
50 | INTRAVENOUS | Status: AC
Start: 2022-04-16 — End: ?

## 2022-04-16 MED ORDER — LIDOCAINE HCL (PF) 2 % IJ SOLN
2 | INTRAMUSCULAR | Status: AC
Start: 2022-04-16 — End: ?

## 2022-04-16 MED ORDER — ALUM & MAG HYDROXIDE-SIMETH 200-200-20 MG/5ML PO SUSP
200-200-205 MG/5ML | Freq: Four times a day (QID) | ORAL | Status: DC | PRN
Start: 2022-04-16 — End: 2022-04-17

## 2022-04-16 MED ORDER — LATANOPROST 0.005 % OP SOLN
0.005 % | Freq: Every evening | OPHTHALMIC | Status: DC
Start: 2022-04-16 — End: 2022-04-17
  Administered 2022-04-17: 01:00:00 1 [drp] via OPHTHALMIC

## 2022-04-16 MED ORDER — OXYCODONE-ACETAMINOPHEN 5-325 MG PO TABS
5-325 MG | Freq: Four times a day (QID) | ORAL | Status: DC | PRN
Start: 2022-04-16 — End: 2022-04-17
  Administered 2022-04-16 – 2022-04-17 (×3): 1 via ORAL

## 2022-04-16 MED ORDER — ACETAMINOPHEN 500 MG PO TABS
500 | Freq: Once | ORAL | Status: AC
Start: 2022-04-16 — End: 2022-04-16

## 2022-04-16 MED ORDER — GELATIN ABSORBABLE 100 CM EX MISC
100 | CUTANEOUS | Status: DC | PRN
Start: 2022-04-16 — End: 2022-04-16
  Administered 2022-04-16: 15:00:00 1 via TOPICAL

## 2022-04-16 MED ORDER — PANTOPRAZOLE SODIUM 40 MG PO TBEC
40 MG | Freq: Every day | ORAL | Status: DC
Start: 2022-04-16 — End: 2022-04-17
  Administered 2022-04-17: 11:00:00 40 mg via ORAL

## 2022-04-16 MED ORDER — MAGNESIUM SULFATE 50 % IJ SOLN
50 % | INTRAMUSCULAR | Status: DC | PRN
  Administered 2022-04-16: 14:00:00 1 via INTRAVENOUS

## 2022-04-16 MED ORDER — CEFAZOLIN 3000 MG IN NS 100 ML IVPB
Status: AC
Start: 2022-04-16 — End: 2022-04-16
  Administered 2022-04-16: 13:00:00 2000 mg via INTRAVENOUS

## 2022-04-16 MED ORDER — DEXAMETHASONE SODIUM PHOSPHATE 4 MG/ML IJ SOLN
4 | INTRAMUSCULAR | Status: AC
Start: 2022-04-16 — End: ?

## 2022-04-16 MED ORDER — LACTATED RINGERS IV SOLN
INTRAVENOUS | Status: DC
Start: 2022-04-16 — End: 2022-04-16
  Administered 2022-04-16 (×2): via INTRAVENOUS

## 2022-04-16 MED ORDER — ONDANSETRON HCL 4 MG/2ML IJ SOLN
4 MG/2ML | INTRAMUSCULAR | Status: DC | PRN
  Administered 2022-04-16: 15:00:00 4 via INTRAVENOUS

## 2022-04-16 MED ORDER — LOSARTAN POTASSIUM 50 MG PO TABS
50 MG | Freq: Every day | ORAL | Status: DC
Start: 2022-04-16 — End: 2022-04-17
  Administered 2022-04-17: 14:00:00 100 mg via ORAL

## 2022-04-16 MED ORDER — ONDANSETRON HCL 4 MG/2ML IJ SOLN
4 | INTRAMUSCULAR | Status: AC
Start: 2022-04-16 — End: ?

## 2022-04-16 MED ORDER — PROPOFOL 200 MG/20ML IV EMUL
200 MG/20ML | INTRAVENOUS | Status: DC | PRN
  Administered 2022-04-16: 15:00:00 50 via INTRAVENOUS
  Administered 2022-04-16: 13:00:00 150 via INTRAVENOUS

## 2022-04-16 MED ORDER — DEXAMETHASONE SODIUM PHOSPHATE 4 MG/ML IJ SOLN
4 MG/ML | INTRAMUSCULAR | Status: DC | PRN
  Administered 2022-04-16: 13:00:00 8 via INTRAVENOUS

## 2022-04-16 MED ORDER — BUPIVACAINE-EPINEPHRINE 0.5% -1:200000 IJ SOLN
INTRAMUSCULAR | Status: DC | PRN
Start: 2022-04-16 — End: 2022-04-16
  Administered 2022-04-16: 15:00:00 20 via INTRADERMAL

## 2022-04-16 MED ORDER — PHENYLEPHRINE HCL (PRESSORS) 10 MG/ML IV SOLN
10 | INTRAVENOUS | Status: AC
Start: 2022-04-16 — End: ?

## 2022-04-16 MED ORDER — LIDOCAINE HCL (PF) 1 % IJ SOLN
1 | INTRAMUSCULAR | Status: AC
Start: 2022-04-16 — End: 2022-04-16
  Administered 2022-04-16: 12:00:00 0.2 via INTRADERMAL

## 2022-04-16 MED ORDER — ROCURONIUM BROMIDE 50 MG/5ML IV SOLN
50 MG/5ML | INTRAVENOUS | Status: DC | PRN
  Administered 2022-04-16: 13:00:00 40 via INTRAVENOUS
  Administered 2022-04-16: 13:00:00 10 via INTRAVENOUS

## 2022-04-16 MED ORDER — ROCURONIUM BROMIDE 50 MG/5ML IV SOLN
50 | INTRAVENOUS | Status: AC
Start: 2022-04-16 — End: ?

## 2022-04-16 MED ORDER — PHENOL 1.4 % MT LIQD
1.4 % | OROMUCOSAL | Status: DC | PRN
Start: 2022-04-16 — End: 2022-04-17

## 2022-04-16 MED ORDER — GABAPENTIN 300 MG PO CAPS
300 MG | Freq: Two times a day (BID) | ORAL | Status: DC
Start: 2022-04-16 — End: 2022-04-17
  Administered 2022-04-17 (×2): 300 mg via ORAL

## 2022-04-16 MED FILL — MORPHINE SULFATE (PF) 4 MG/ML IJ SOLN: 4 mg/mL | INTRAMUSCULAR | Qty: 1

## 2022-04-16 MED FILL — XYLOCAINE-MPF 2 % IJ SOLN: 2 % | INTRAMUSCULAR | Qty: 5

## 2022-04-16 MED FILL — ROCURONIUM BROMIDE 50 MG/5ML IV SOLN: 50 MG/5ML | INTRAVENOUS | Qty: 5

## 2022-04-16 MED FILL — PHENYLEPHRINE HCL (PRESSORS) 10 MG/ML IV SOLN: 10 MG/ML | INTRAVENOUS | Qty: 1

## 2022-04-16 MED FILL — ANECTINE 20 MG/ML IJ SOLN: 20 MG/ML | INTRAMUSCULAR | Qty: 10

## 2022-04-16 MED FILL — LORAZEPAM 2 MG/ML IJ SOLN: 2 MG/ML | INTRAMUSCULAR | Qty: 1

## 2022-04-16 MED FILL — FENTANYL CITRATE (PF) 100 MCG/2ML IJ SOLN: 100 MCG/2ML | INTRAMUSCULAR | Qty: 2

## 2022-04-16 MED FILL — CEFAZOLIN SODIUM-DEXTROSE 2-3 GM-%(50ML) IV SOLR: 2000 mg in Dextrose 3% | INTRAVENOUS | Qty: 50

## 2022-04-16 MED FILL — CEFAZOLIN SODIUM 10 G IJ SOLR: 10 g | INTRAMUSCULAR | Qty: 3

## 2022-04-16 MED FILL — DILAUDID 1 MG/ML IJ SOLN: 1 MG/ML | INTRAMUSCULAR | Qty: 0.5

## 2022-04-16 MED FILL — OXYCODONE-ACETAMINOPHEN 5-325 MG PO TABS: 5-325 MG | ORAL | Qty: 1

## 2022-04-16 MED FILL — CYCLOBENZAPRINE HCL 10 MG PO TABS: 10 MG | ORAL | Qty: 1

## 2022-04-16 MED FILL — DEXAMETHASONE SODIUM PHOSPHATE 4 MG/ML IJ SOLN: 4 MG/ML | INTRAMUSCULAR | Qty: 2

## 2022-04-16 MED FILL — MAGNESIUM SULFATE 50 % IJ SOLN: 50 % | INTRAMUSCULAR | Qty: 2

## 2022-04-16 MED FILL — KETAMINE HCL 50 MG/5ML IJ SOSY: 50 MG/5ML | INTRAMUSCULAR | Qty: 5

## 2022-04-16 MED FILL — XELPROS 0.005 % OP EMUL: 0.005 % | OPHTHALMIC | Qty: 2.5

## 2022-04-16 MED FILL — LATANOPROST 0.005 % OP SOLN: 0.005 % | OPHTHALMIC | Qty: 2.5

## 2022-04-16 MED FILL — ONDANSETRON HCL 4 MG/2ML IJ SOLN: 4 MG/2ML | INTRAMUSCULAR | Qty: 2

## 2022-04-16 MED FILL — EPHEDRINE SULFATE (PRESSORS) 50 MG/ML IV SOLN: 50 MG/ML | INTRAVENOUS | Qty: 1

## 2022-04-16 MED FILL — ACETAMINOPHEN EXTRA STRENGTH 500 MG PO TABS: 500 MG | ORAL | Qty: 2

## 2022-04-16 MED FILL — GLYCOPYRROLATE PF 0.4 MG/2ML IJ SOSY: 0.4 MG/2ML | INTRAMUSCULAR | Qty: 2

## 2022-04-16 MED FILL — NEOSTIGMINE METHYLSULFATE 5 MG/5ML IV SOSY: 5 MG/ML | INTRAVENOUS | Qty: 5

## 2022-04-16 MED FILL — XYLOCAINE-MPF 1 % IJ SOLN: 1 % | INTRAMUSCULAR | Qty: 2

## 2022-04-16 MED FILL — DIPRIVAN 200 MG/20ML IV EMUL: 200 MG/20ML | INTRAVENOUS | Qty: 20

## 2022-04-16 MED FILL — FLUTICASONE PROPIONATE 50 MCG/ACT NA SUSP: 50 MCG/ACT | NASAL | Qty: 16

## 2022-04-16 MED FILL — MONOJECT FLUSH SYRINGE 0.9 % IV SOLN: 0.9 % | INTRAVENOUS | Qty: 10

## 2022-04-16 NOTE — Anesthesia Procedure Notes (Signed)
Airway  Date/Time: 04/16/2022 8:04 AM  Urgency: elective    Airway not difficult    General Information and Staff    Patient location during procedure: OR  Anesthesiologist: Haynes Dage, MD  Resident/CRNA: Arleta Creek, APRN - CRNA  Performed: resident/CRNA/CAA   Performed by: Arleta Creek, APRN - CRNA  Authorized by: Haynes Dage, MD      Indications and Patient Condition  Indications for airway management: anesthesia  Spontaneous Ventilation: absent  Sedation level: deep  Preoxygenated: yes  Patient position: sniffing  Mask difficulty assessment: vent by bag mask    Final Airway Details  Final airway type: endotracheal airway      Successful airway: ETT  Cuffed: yes   Successful intubation technique: video laryngoscopy  Facilitating devices/methods: intubating stylet  Endotracheal tube insertion site: oral  Blade size: #4  ETT size (mm): 7.5  Cormack-Lehane Classification: grade I - full view of glottis  Placement verified by: chest auscultation and capnometry   Measured from: lips  ETT to lips (cm): 23  Number of attempts at approach: 1    no

## 2022-04-16 NOTE — Op Note (Signed)
Operative Note      Patient: William [name] [facility]  Date of Birth: Jun 25, 1950  MRN: [id number]    Date of Procedure: 04/16/2022      OPERATIVE REPORT    Surgeon:  Cleopatra Cedar, MD    Assistant:  Pollie Meyer, 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    Post-Op Diagnosis:  Same  Epidural Hematoma and Complex Synovial Cyst    Operation:  Bilateral L3/4 Semi Hemilaminectomy, Medial Facetectomy and Proximal Foraminotomy with Removal of Synovial Cyst and Chronic Organized Epidural Hematoma  Microsurgically Assisted Dissection  Intraoperative Interpretation of Radiographic Images    Anesthesia:  GETA  EBL: 300  cc (confirmed with anesthesia)  ERAS Intraoperative Surgical Protocol Followed Throughout the Procedure    Indications for Surgery:  The patient suffers from severe L3/4 stenosis  with associated neural compression and clinical signs and symptoms.  There is good correlation between radiographic images and clinical signs and symptoms. The rationale, risks, and benefits of surgery have been discussed with the patient who understands and wishes to proceed.    Details of Procedure:  General endotracheal anesthesia was induced. The patient was positioned prone on the operating room table. The surgical field was prepped and draped in sterile fashion. Intraoperative x-rays were performed and interpreted throughout the procedure. A linear skin incision was made over the surgical level. Dissection was carried down to the paraspinal muscle fascia which was opened sharply. Subperiosteal dissection was performed to elevate the paraspinal musculature from the spinous processes, laminae and posterolateral elements. Self-retaining retractors were placed.     Under microscopic vision using the Zeiss operating microscope, a bilateral L3/4 semi-hemilaminectomy, medial facetectomy and proximal foraminotomy was performed.  I immediately encountered a complex synovial cyst and aspirated the cyst contents.  I then noted that there was  extensive chronic epidural hematoma with extension into the ligament and dense adherence and scaring to dura. Using microsurgical technique, I carefully separated the hematoma-ligament complex from the  underlying dura, thereby completing the decompression.  The pedicles were palpated and the nerve roots noted to exit from their respective foramina in an unobstructed fashion. The area was carefully inspected to assure that excellent decompression of neural elements was achieved.      The wound was copiously irrigated with antibiotic solution. Meticulous hemostasis was achieved. The wound edges were infiltrated with Marcaine solution. The incision was closed in layers and a sterile dressing applied. Sponge, cottonoid and needle counts were reported to be correct. Patient was transported to the recovery room in satisfactory condition with apparent preservation of neurologic function.    Due to presence of synovial cyst and complex chronic epidural hematoma the procedure was of increased complexity throughout.  Dissection, exposure, microsurgical decompression were all of increased risk and required extended time to complete safely.        Notes:  Pollie Meyer, [facility] participated in all phases of the procedure including exposure, decompression, microsurgery.    Chrystie Nose. Marita Snellen, MD      Drains:   Closed/Suction Drain Inferior Back Accordion (Active)       Electronically signed by Mardee Postin, MD on 04/16/2022 at 10:03 AM

## 2022-04-16 NOTE — Anesthesia Post-Procedure Evaluation (Signed)
Department of Anesthesiology  Postprocedure Note    Patient: Diontre Sciullo  MRN: [id number]  Birthdate: 08/30/1950  Date of evaluation: 04/16/2022    Procedure Summary       Date: 04/16/22 Room / Location: [facility] OR 03 / [facility]    Anesthesia Start: [id number] Anesthesia Stop: 1013    Procedure: BILATERAL L3/4 LUMBAR DECOMPRESSION (Back) Diagnosis:       Lumbar stenosis with neurogenic claudication      (Lumbar stenosis with neurogenic claudication [M48.062])    Surgeons: Mardee Postin, MD Responsible Provider: Haynes Dage, MD    Anesthesia Type: General ASA Status: 3            Anesthesia Type: General    Aldrete Phase I: Aldrete Score: 9    Aldrete Phase II: Aldrete Score: 9    Anesthesia Post Evaluation    Patient location during evaluation: PACU  Patient participation: complete - patient participated  Level of consciousness: awake and alert  Pain score: 2  Airway patency: patent  Nausea & Vomiting: no vomiting  Cardiovascular status: hemodynamically stable  Respiratory status: spontaneous ventilation, room air and acceptable  Hydration status: stable  Comments: VSS  Pain management: satisfactory to patient    No notable events documented.

## 2022-04-16 NOTE — Anesthesia Pre-Procedure Evaluation (Signed)
Department of Anesthesiology  Preprocedure Note       Name:  William [name]   Age:  72 y.o.  DOB:  1950-05-07                                          MRN:  [id number]         Date:  04/16/2022      Surgeon: Juliann Mule):  Mardee Postin, MD    Procedure: Procedure(s):  BILATERAL L3/4 LUMBAR DECOMPRESSION    Medications prior to admission:   Prior to Admission medications    Medication Sig Start Date End Date Taking? Authorizing Provider   Multiple Vitamins-Minerals (CENTRUM VITAMINTS PO) Take 1 tablet by mouth every other day    [provider]   gabapentin (NEURONTIN) 300 MG capsule Take 1 capsule by mouth 2 times daily for 90 days. 03/20/22 06/18/22  Mardee Postin, MD   rosuvastatin (CRESTOR) 40 MG tablet Take 1 tablet by mouth daily 01/13/22   Justine Null, Anselmo Pickler., MD   carvedilol (COREG) 6.25 MG tablet TAKE 1 TABLET TWICE A DAY  AS DIRECTED  Patient taking differently: Take 1 tablet by mouth 2 times daily 11/25/21   Carol Ada, PA-C   omeprazole (PRILOSEC) 20 MG delayed release capsule Take 1 capsule by mouth daily 10/18/21   Leilani Merl, APRN - NP   losartan (COZAAR) 100 MG tablet Take 1 tablet by mouth daily    [provider]   ticagrelor (BRILINTA) 90 MG TABS tablet Take 1 tablet by mouth 2 times daily 08/22/21   Dalmau, Valorie Roosevelt, PA-C   fluticasone (FLONASE) 50 MCG/ACT nasal spray 1 spray by Each Nostril route daily    [provider]   nitroGLYCERIN (NITROSTAT) 0.4 MG SL tablet as directed Sublingual 1 tab under tongue q 5 minutes x 3 doses chest pain doe not help call 911  Patient taking differently: Place 1 tablet under the tongue every 5 minutes as needed for Chest pain as directed Sublingual 1 tab under tongue q 5 minutes x 3 doses chest pain doe not help call 911 06/06/21   Alexis Goodell., MD   Semaglutide, 2 MG/DOSE, (OZEMPIC, 2 MG/DOSE,) 8 MG/3ML SOPN Inject 2 mg into the skin once a week  Patient taking differently: Inject 0.75 mLs into the skin once a week Tuesday  06/06/21   Alexis Goodell., MD   aspirin 81 MG EC tablet Take 1 tablet by mouth daily    [provider]   Coenzyme Q10 (COQ10 PO) Take 200 mg by mouth every evening    [provider]   diphenhydrAMINE-APAP, sleep, (TYLENOL PM EXTRA STRENGTH) 25-500 MG tablet Take 2 tablets by mouth nightly as needed for Sleep    [facility], MD   Latanoprost (XELPROS) 0.005 % EMUL Place 1 drop into both eyes every evening    [facility], MD       Current medications:    Current Facility-Administered Medications   Medication Dose Route Frequency Provider Last Rate Last Admin   . lactated ringers IV soln infusion   IntraVENous Continuous Pollie Meyer W, PA       . sodium chloride flush 0.9 % injection 5-40 mL  5-40 mL IntraVENous 2 times per day Beverlyn Roux, [place]       .  sodium chloride flush 0.9 % injection 5-40 mL  5-40 mL IntraVENous PRN Pollie Meyer W, PA       . 0.9 % sodium chloride infusion   IntraVENous PRN Pollie Meyer W, PA       . dexAMETHasone (DECADRON) injection 8 mg  8 mg IntraVENous Once Uberman, Ryan W, PA       . ceFAZolin (ANCEF) 3000 mg in sodium chloride 0.9% 100 mL IVPB  3,000 mg IntraVENous On Call to OR Pollie Meyer W, PA       . sodium chloride flush 0.9 % injection 5-40 mL  5-40 mL IntraVENous 2 times per day Cleopatra Cedar D, MD       . sodium chloride flush 0.9 % injection 5-40 mL  5-40 mL IntraVENous PRN Cleopatra Cedar D, MD       . 0.9 % sodium chloride infusion   IntraVENous PRN Mardee Postin, MD       . lactated ringers IV soln infusion   IntraVENous Continuous Mardee Postin, MD 40 mL/hr at 04/16/22 0648 New Bag at 04/16/22 [id number]   . ceFAZolin (ANCEF) 2000 mg in Dextrose 3% IVPB (duplex)                Allergies:  No Known Allergies    Problem List:    Patient Active Problem List   Diagnosis Code   . Anxiety F41.9   . Biceps tendinitis of left shoulder M75.22   . Coronary atherosclerosis I25.10   . Essential hypertension I10    . Hyperlipidemia E78.5   . Insomnia G47.00   . Low back pain M54.50   . Metabolic syndrome 0000000   . Obstructive sleep apnea syndrome G47.33   . 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 Prediabetes R73.03   . Shoulder pain M25.519   . Strain of left rotator cuff capsule S46.012A   . Angina pectoris, unspecified I20.9   . Atherosclerotic heart disease of native coronary artery with unspecified angina pectoris I25.119   . Colicky epigastric pain R10.13   . Abnormal nuclear stress test R94.39   . Ischemic cardiomyopathy I25.5   . Chest pain R07.9   . Fatigue R53.83   . History of coronary artery stent placement Z95.5   . History of non-ST elevation myocardial infarction (NSTEMI) I25.2   . Obesity E66.9   . Pure hypercholesterolemia, unspecified E78.00   . Lumbar stenosis with neurogenic claudication M48.062       Past Medical History:        Diagnosis Date   . Eye pressure    . GERD (gastroesophageal reflux disease)    . Heart attack ([place]) 05/2021   . Hyperlipidemia    . Hypertension    . Lumbar back pain    . OSA on CPAP    . Prediabetes    . Type 2 diabetes mellitus with hyperglycemia, without long-term current use of insulin ([place]) 04/08/2022       Past Surgical History:        Procedure Laterality Date   . BACK SURGERY  1991   . CARDIAC PROCEDURE N/A 08/21/2021    Left heart cath performed by Denzil Magnuson, MD at [facility]   . CARDIAC PROCEDURE N/A 08/21/2021    Insert stent des coronary performed by Denzil Magnuson, MD at [facility]   . CAROTID STENT  01/2018   . COLONOSCOPY     . KNEE CARTILAGE SURGERY     .  TONSILLECTOMY         Social History:    Social History     Tobacco Use   . Smoking status: Former     Current packs/day: 0.00     Average packs/day: 0.6 packs/day for 26.6 years (15.0 ttl pk-yrs)     Types: Cigarettes     Start date: 10/07/1966     Quit date: 1984     Years since quitting: 40.1   . Smokeless tobacco: Never   Substance Use Topics   . Alcohol use: Yes      Alcohol/week: 6.0 standard drinks of alcohol     Types: 6 Drinks containing 0.5 oz of alcohol per week                                Counseling given: Not Answered      Vital Signs (Current):   Vitals:    04/14/22 1404 04/16/22 0610   BP:  130/74   Pulse:  75   Resp:  18   Temp:  98.8 F (37.1 C)   TempSrc:  Oral   SpO2:  95%   Weight: 121.6 kg (268 lb) 123.8 kg (272 lb 14.9 oz)   Height: 1.93 m (6' 4"$ ) 1.93 m (6' 3.98")                                              BP Readings from Last 3 Encounters:   04/16/22 130/74   04/08/22 128/72   02/20/22 130/74       NPO Status: Time of last liquid consumption: 0500                        Time of last solid consumption: 1700                        Date of last liquid consumption: 04/16/22                        Date of last solid food consumption: 04/14/22    BMI:   Wt Readings from Last 3 Encounters:   04/16/22 123.8 kg (272 lb 14.9 oz)   04/08/22 124.7 kg (275 lb)   03/20/22 121.6 kg (268 lb)     Body mass index is 33.24 kg/m.    CBC:   Lab Results   Component Value Date/Time    WBC 5.9 04/05/2022 09:45 AM    RBC 4.27 04/05/2022 09:45 AM    HGB 13.0 04/05/2022 09:45 AM    HCT 40.0 04/05/2022 09:45 AM    MCV 93.7 04/05/2022 09:45 AM    RDW 12.3 04/05/2022 09:45 AM    PLT 171 04/05/2022 09:45 AM       CMP:   Lab Results   Component Value Date/Time    NA 141 12/31/2021 08:01 AM    K 4.4 12/31/2021 08:01 AM    CL 104 12/31/2021 08:01 AM    CO2 27 12/31/2021 08:01 AM    BUN 23 12/31/2021 08:01 AM    CREATININE 0.8 12/31/2021 08:01 AM    GFRAA 99 05/31/2020 07:20 AM    AGRATIO 1.80 12/31/2021 08:01 AM    LABGLOM 93 12/31/2021 08:01 AM    GLUCOSE  106 12/31/2021 08:01 AM    PROT 6.5 12/31/2021 08:01 AM    CALCIUM 9.3 12/31/2021 08:01 AM    BILITOT 0.45 12/31/2021 08:01 AM    ALKPHOS 41 12/31/2021 08:01 AM    AST 19 12/31/2021 08:01 AM    ALT 19 12/31/2021 08:01 AM       POC Tests:   Recent Labs     04/16/22  0651   POCGLU 103.0       Coags:   Lab Results   Component Value  Date/Time    PROTIME 13.8 08/14/2021 02:19 PM    INR 1.1 08/14/2021 02:19 PM       HCG (If Applicable): No results found for: "PREGTESTUR", "PREGSERUM", "HCG", "HCGQUANT"     ABGs: No results found for: "PHART", "PO2ART", "PCO2ART", "HCO3ART", "BEART", "O2SATART"     Type & Screen (If Applicable):  No results found for: "LABABO", "LABRH"    Drug/Infectious Status (If Applicable):  No results found for: "HIV", "HEPCAB"    COVID-19 Screening (If Applicable):   Lab Results   Component Value Date/Time    COVID19 Not Detected 06/29/2020 09:23 AM           Anesthesia Evaluation  Patient summary reviewed  Airway: Mallampati: III  TM distance: >3 FB   Neck ROM: limited  Mouth opening: > = 3 FB   Dental: normal exam         Pulmonary: breath sounds clear to auscultation  (+)     sleep apnea: on CPAP,                                  Cardiovascular:  Exercise tolerance: no interval change  (+) hypertension: mild, angina: no interval change, past MI: no interval change and > 6 months, CAD: no interval change, CABG/stent: no interval change, hyperlipidemia        Rhythm: regular  Rate: normal  Echocardiogram reviewed  Stress test reviewed       Beta Blocker:  Dose within 24 Hrs         Neuro/Psych:   (+) psychiatric history: stable with treatment            GI/Hepatic/Renal:   (+) GERD: well controlled          Endo/Other:    (+) DiabetesType II DM, well controlled.                 Abdominal:             Vascular: negative vascular ROS.         Other Findings:       Anesthesia Plan      general     ASA 3       Induction: intravenous.    MIPS: Postoperative opioids intended and Prophylactic antiemetics administered.  Anesthetic plan and risks discussed with patient, spouse and child/children.      Plan discussed with CRNA.    Attending anesthesiologist reviewed and agrees with Preprocedure content            Haynes Dage, MD   04/16/2022

## 2022-04-16 NOTE — H&P (Signed)
HPI:                    [name] is a 72 y.o. male presents complaining of a 3 to 4-week history of bilateral low back pain with pain that radiates lateral aspect of each thigh stopping at the knee made worse with walking and standing better with sitting and stooping over and laying down.  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                       Current Medications:    Current Medication      Current Outpatient Medications:     Latanoprost (XELPROS) 0.005 % EMUL, Place 1 drop into both eyes every evening, Disp: , Rfl:     rosuvastatin (CRESTOR) 40 MG tablet, Take 1 tablet by mouth daily, Disp: 90 tablet, Rfl: 3    carvedilol (COREG) 6.25 MG tablet, TAKE 1 TABLET TWICE A DAY  AS DIRECTED, Disp: 180 tablet, Rfl: 1    omeprazole (PRILOSEC) 20 MG delayed release capsule, Take 1 capsule by mouth daily, Disp: 90 capsule, Rfl: 1    losartan (COZAAR) 100 MG tablet, Take 1 tablet by mouth daily, Disp: , Rfl:     ticagrelor (BRILINTA) 90 MG TABS tablet, Take 1 tablet by mouth 2 times daily, Disp: 180 tablet, Rfl: 3    fluticasone (FLONASE) 50 MCG/ACT nasal spray, 1 spray by Each Nostril route daily, Disp: , Rfl:     nitroGLYCERIN (NITROSTAT) 0.4 MG SL tablet, as directed Sublingual 1 tab under tongue q 5 minutes x 3 doses chest pain doe not help call 911 (Patient taking differently: Place 1 tablet under the tongue every 5 minutes as needed for Chest pain as directed Sublingual 1 tab under tongue q 5 minutes x 3 doses chest pain doe not help call 911), Disp: 25 tablet, Rfl: 5    Semaglutide, 2 MG/DOSE, (OZEMPIC, 2 MG/DOSE,) 8 MG/3ML SOPN, Inject 2 mg into the skin once a week, Disp: 9 mL, Rfl: 3    aspirin 81 MG EC tablet, Take 1 tablet by mouth daily, Disp: , Rfl:     Coenzyme Q10 (COQ10 PO), Take 200 mg by mouth daily, Disp: , Rfl:     diphenhydrAMINE-APAP, [name], (TYLENOL PM EXTRA STRENGTH) 25-500 MG tablet, 2 tablets as needed for [name], Disp: , Rfl:         The patient has No Known Allergies.      Past Medical History  William [name]  has a past medical history of Eye pressure, GERD (gastroesophageal reflux disease), Heart attack ([place]), Hyperlipidemia, Hypertension, and Prediabetes.     Past Surgical History  The patient  has a past surgical history that includes Carotid stent (01/2018); back surgery (1991); Tonsillectomy; Knee cartilage surgery; Cardiac procedure (N/A, 08/21/2021); and Cardiac procedure (N/A, 08/21/2021).     Family History  This patient's family history includes Alzheimer's Disease in his mother; Heart Failure in his father and maternal grandmother; No Known Problems in his maternal grandfather, paternal grandfather, and paternal grandmother.     Social History  William [name]  reports that he quit smoking about 40 years ago. His smoking use included cigarettes. He started smoking about 40 years ago. He has a 0.1 pack-year smoking history. He has never used smokeless tobacco. He reports current alcohol use of about 4.0 standard drinks of alcohol per week. He reports that he does not use drugs.    ROS:  Review of Systems     Objective:     Vitals:     Past Results:     Examination:          General Examination          GENERAL APPEARANCE: well-appearing individual, in no acute distress.           HEAD: normocephalic, atraumatic.           NECK: neck supple, full range of motion, non-tender to palpation, no paraspinal muscle spasms.          BACK: supple, full range of motion, spine nontender to palpation, no paraspinal muscle spasm.                          Neurological          MENTAL STATUS alert and oriented X 3, speech fluent, affect appropriate.           MOTOR STRENGTH: full symmetric strength throughout with normal bulk and tone.           SENSORY: intact to pinprick, light touch,  and position sense.           REFLEXES: Diminished distally             Summary of Radiographic Findings: I independently interpreted the patient's imaging studies in the context of the clinical presentation.  MRI of the  lumbar spine shows evidence for prior surgery at L5-S1 on the right.  At L3-4, the patient has developed very severe lumbar stenosis with severe cauda equina compression and near complete obliteration of the thecal sac.    Assessment & Plan:     The patient presents with exacerbation of a chronic problem characterized by neurogenic claudication secondary to severe lumbar stenosis.     Neurologic examination is as reported above.     I independently interpreted the imaging studies in the context of the clinical presentation.  Findings are as reported above.     We discussed treatment options which include medication management, conservative treatment measures, and major elective surgery with significant medical risk factors, including cardiac disease and a myocardial infarction with subsequent stent placement within the last year (August 21, 2021).  He reports that he has been instructed to take anticoagulants for 1 year subsequent to placement of the stents.     This patient would clearly benefit from lumbar decompression at L3-4.  However, he has considerable cardiac risks.  I will discuss his case with Dr. Tamala Julian, his cardiologist.  In the interim he will continue to use Tylenol as needed and I will prescribe a trial course of Neurontin 300 mg p.o. twice daily.       Diagnosis Orders   1. Spinal stenosis of lumbar region with neurogenic claudication             Mardee Postin, MD     Addendum: I communicated with Dr. Tamala Julian who informs me that it is okay for the patient to pause his anticoagulants and proceed with surgery.  I then discussed the case with the patient who understands and wishes to proceed.     I reviewed the rationale, risks and potential benefits of bilateral L3-4 lumbar decompression with the patient who understands and wishes to proceed. I explained to the patient that the goal of surgery is to decompress neural elements offers the best chance for nerve recovery to occur and for symptoms stop  progressing and to  improve. While nerves can recover fully, the timeframe varies on an individual basis and if the nerves have suffered permanent impairment as a consequence of the preoperative neural compression they may not recover fully or at all. Hence, no guarantees can be made.      Mardee Postin, MD     ADDENDUM:     I emailed Dr. Mauri Brooklyn (internal to Epic) to inquire regarding proceeding with surgery.  He reported back that because William [name] is more than 6 months out from stent placement it is OK to pause anticoagulation surrounding surgery.       I then spoke to William [name] by phone and he told me that he would like to proceed with surgery.     Mardee Postin, MD    No changes

## 2022-04-16 NOTE — Other (Signed)
History sleep apnea- wears c-pap at night

## 2022-04-16 NOTE — Progress Notes (Signed)
POD 0 status post L3-4 bilateral decompression    S:  Denies low back pain lower extremity pain numbness or tingling    O:  Blood pressure (!) 150/74, pulse 74, temperature 97.6 F (36.4 C), temperature source Oral, resp. rate 16, height 1.93 m (6' 3.98"), weight 123.8 kg (272 lb 14.9 oz), SpO2 95 %.     No intake/output data recorded.  I/O this shift:  In: 1600 [P.O.:450; I.V.:1150]  Out: 330 [Drains:30; Blood:300]     EBL during case 300 ml    EXAM:  Constitutional alert and oriented x 3  Manual motor testing 5 out of 5 strength bilateral lower extremities muscle groups  Dressing clean dry and intact   drain productive of scant bloody drainage    A/P:  Most likely home tomorrow

## 2022-04-16 NOTE — Discharge Instructions (Signed)
Post-op Lumbar Fusion    After your surgery:  Frequent Questions or Concerns    Persistent leg pain:  Most patients notice an improvement in their pre-operative pain.  However, you may continue to experience an ache in your leg.  Nerves have a "memory" for the pain and can remain somewhat irritated.  This usually subsides within the first couple of weeks following surgery.    Ongoing numbness or tingling:  Your pre-operative pain may be improved or gone, but you can feel more numbness in the earlier days after surgery.  This is common.  Before surgery the pain was interfering with other sensations that may be more noticeable now.  Numbness and tingling typically take the longest to improve after surgery.    Ongoing weakness:  If you experienced weakness prior to surgery, it may take weeks to months for this to improve.  Your pre-operative weakness may not return completely back to normal.  Improvement in strength generally occurs over weeks and months.    Activity:    No strenuous activity.    You may walk as you are able, slowly increasing your distance for up to 30 minutes.  This should be your only form of exercise in the first two weeks after surgery.    Limit bending and twisting.  Remember to "log roll" out of bed.    Do not lift more than 8-10 pounds.    If you are fitted with a brace, wear it as ordered by doctor.  You should not need to wear it in bed.  You will need to wear your brace when you are out of bed for more than 5 minutes.    Limit sitting in a straight back chair to 20-30 minutes.    You may shower the day after surgery.  A therapist will review ways to make showering and other daily tasks safer and easier after surgery.    Please do not drive until you are given clearance from your doctor.  You may ride as a passenger, but avoid sitting for long periods of time.    Incision Care:      Steri-strips (small strips of surgical tape) or Dermabond "skin glue") have been applied in the operating  room to reinforce the incision.    Remove the bandage prior to showering.  Let water rinse over incision.    Allow to dry before putting on a clean bandage.    Change dressing daily.    Steri strips will fall off on their own, DO NOT rip off.  Dermabond will "peel off", DO NOT scrub.    If you have staples or stitches, they will be removed at your follow up appointment within 10-14 days.    No creams, ointments, or powders are to be applied to incision.    Do not submerge incision under water.  No bathtubs or swimming until instructed by doctor.    Diet:    You do not have new diet instruction.    Drink plenty of water and eat foods high in fiber (fruits and vegetables).    Medications:    DO NOT take any non-steroidal anti-inflammatory medications such as: Motrin, Ibuprofen, Aleve, Advil, Naprosyn, Mobic, Voltaren, or Celebrex for the next 3 months, as these medications can interfere with bony fusion.    DO NOT take aspirin, aspirin products, fish oil, or blood thinners for 1 week after surgery.  After 1 week, you may resume taking aspirin, aspirin products, fish oil, or blood   thinners.    Managing Your Pain:    You will have a prescription for medicine to take home.  Please read the instructions for taking this medication and the possible side effects.    DO NOT drive while taking pain medicine.  Medication can make you sleepy.    It is recommended that you take stool softeners and drink plenty of water.  Medication can cause constipation.    Ice packs may be applied to incision site to help with discomfort and swelling.  NO hot packs on the incision.    You will have some discomfort at the incision as a result of the operation.  Incision pain can last for several weeks, but should improve over time, not get worse.  Your pain before surgery may be better or gone.  It is normal to feel more numbness in the early days after surgery.    Call Your Doctor for:    Temperature higher than 100.5 degrees.    Redness,  drainage, or increased swelling at the incision site.    Increased difficulty walking.    Increased pain, numbness, or weakness in your legs.    Follow-up:    Call [phone] to confirm your appointment.    If you have staples or sutures, schedule a visit for 10-14 days after surgery.  If not, you will be scheduled for a visit 2-3 weeks after surgery.

## 2022-04-17 MED ORDER — OXYCODONE-ACETAMINOPHEN 5-325 MG PO TABS
5-325 | ORAL_TABLET | Freq: Four times a day (QID) | ORAL | 0 refills | Status: AC | PRN
Start: 2022-04-17 — End: 2022-04-22

## 2022-04-17 MED FILL — LOSARTAN POTASSIUM 50 MG PO TABS: 50 MG | ORAL | Qty: 2

## 2022-04-17 MED FILL — PANTOPRAZOLE SODIUM 40 MG PO TBEC: 40 MG | ORAL | Qty: 1

## 2022-04-17 MED FILL — BISACODYL EC 5 MG PO TBEC: 5 MG | ORAL | Qty: 1

## 2022-04-17 MED FILL — ACETAMINOPHEN 325 MG PO TABS: 325 MG | ORAL | Qty: 2

## 2022-04-17 MED FILL — CEFAZOLIN SODIUM 10 G IJ SOLR: 10 g | INTRAMUSCULAR | Qty: 3

## 2022-04-17 MED FILL — MONOJECT FLUSH SYRINGE 0.9 % IV SOLN: 0.9 % | INTRAVENOUS | Qty: 10

## 2022-04-17 MED FILL — CARVEDILOL 6.25 MG PO TABS: 6.25 MG | ORAL | Qty: 1

## 2022-04-17 MED FILL — OXYCODONE-ACETAMINOPHEN 5-325 MG PO TABS: 5-325 MG | ORAL | Qty: 1

## 2022-04-17 MED FILL — ROSUVASTATIN CALCIUM 40 MG PO TABS: 40 MG | ORAL | Qty: 1

## 2022-04-17 MED FILL — GABAPENTIN 300 MG PO CAPS: 300 MG | ORAL | Qty: 1

## 2022-04-17 MED FILL — POLYETHYLENE GLYCOL 3350 17 G PO PACK: 17 g | ORAL | Qty: 1

## 2022-04-17 NOTE — Discharge Summary (Signed)
Neurosurgery Discharge Summary     Admission Date : 04/16/2022  Discharge Date : 04/17/2022    Admitting Diagnosis : Lumbar stenosis   Discharge Diagnosis : Lumbar stenosis, Epidural hematoma and complex synovial cyst    Consultations : none  Chief Complaint :     History of Present Illness :   Please see the medical record for full details.  72 y.o. male presents complaining of a 3 to 4-week history of bilateral low back pain with pain that radiates lateral aspect of each thigh stopping at the knee made worse with walking and standing better with sitting and stooping over and laying down.  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   Imaging -   MRI of the lumbar spine shows evidence for prior surgery at L5-S1 on the right.  At L3-4, the patient has developed very severe lumbar stenosis with severe cauda equina compression and near complete obliteration of the thecal sac.   Given the progression of symptoms and the failure of conservative treatment patient  elected to undergo operative intervention in the form of a Bilateral L3/4 Semi Hemilaminectomy, Medial Facetectomy and Proximal Foraminotomy with Removal of Synovial Cyst and Chronic Organized Epidural Hematoma    Hospital and Postoperative Course :  This patient was observed briefly in the PACU and transferred postoperatively to the Neurospine Unit for the reminder of hospitalization.   On day of discharge pain was well controlled, the patient was ambulating, voiding freely without difficulty and tolerating a regular diet.   Patient was deemed acceptable for discharge. Please refer to the progress notes for detailed physical examination on the day of discharge.    Discharge Medications:   Please see the medication reconciliation.    Discharge Instructions:   The patient was given comprehensive instructions regarding the routine care of dressing and incision.   Patient was instructed to avoid driving, heavy lifting greater than 10 lbs, and to  avoid any excessive bending or twisting of the neck or back.   The patient was instructed to avoid any bending, stooping, or vigorous activity. Patient was instructed to exercise daily in the form of light walking.   Patient was instructed to call with any questions, concerns, wound redness, fever, any new numbness, tingling, or weakness.  The patient was instructed to not submerge incision or body in water for at least 6 weeks from date of surgery.     Constipation:  Side effects of narcotics include constipation.  Take a stool softener at least once daily every day you take a narcotic.     Patient was educated on taking NSAIDs and cigarette smoking post operatively:  If you have had a spinal fusion with hardware/instrumentation, you will need to avoid NSAIDs (non steroidal anti-inflammatories such as Ibuprofen, Advil, Aleve, Diclofenac, Meloxicam) for 90 days post op. Taking NSAIDs after a spinal fusion greatly increased your risk of pseudoarthrosis (failure of bones to fuse).     Cigarette smoking significantly increases the risk of pseudoarthrosis (failure of bones to fuse) for patients who have had a fusions, especially spine fusions.   Avoid cigarette smoking completely.     Follow-up :   The patient was given a follow-up appointment to be seen back in the office in 2 weeks.    Current Outpatient Medications   Medication Instructions    aspirin 81 mg, Oral, DAILY    carvedilol (COREG) 6.25 MG tablet TAKE 1 TABLET TWICE A DAY  AS DIRECTED  Coenzyme Q10 (COQ10 PO) 200 mg, Oral, EVERY EVENING    diphenhydrAMINE-APAP, sleep, (TYLENOL PM EXTRA STRENGTH) 25-500 MG tablet Take 2 tablets by mouth nightly as needed for Sleep    fluticasone (FLONASE) 50 MCG/ACT nasal spray 1 spray, Each Nostril, DAILY    gabapentin (NEURONTIN) 300 mg, Oral, 2 TIMES DAILY    Latanoprost (XELPROS) 0.005 % EMUL Place 1 drop into both eyes every evening    losartan (COZAAR) 100 mg, Oral, DAILY    Multiple Vitamins-Minerals (CENTRUM  VITAMINTS PO) 1 tablet, Oral, EVERY OTHER DAY    nitroGLYCERIN (NITROSTAT) 0.4 MG SL tablet as directed Sublingual 1 tab under tongue q 5 minutes x 3 doses chest pain doe not help call 911    omeprazole (PRILOSEC) 20 mg, Oral, DAILY    oxyCODONE-acetaminophen (PERCOCET) 5-325 MG per tablet 1 tablet, Oral, EVERY 6 HOURS PRN    Ozempic (2 MG/DOSE) 2 mg, SubCUTAneous, WEEKLY    rosuvastatin (CRESTOR) 40 mg, Oral, DAILY    ticagrelor (BRILINTA) 90 mg, Oral, 2 TIMES DAILY         Electronically signed by Lenna Sciara L. Mazen Marcin, ACNP-BC, Neurosurgery Nurse Practitioner on 04/17/2022

## 2022-04-17 NOTE — Progress Notes (Signed)
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 male admitted with Lumbar stenosis with neurogenic claudication. He  has a past medical history of Eye pressure, GERD (gastroesophageal reflux disease), Heart attack ([place]), Hyperlipidemia, Hypertension, Lumbar back pain, OSA on CPAP, Prediabetes, and Type 2 diabetes mellitus with hyperglycemia, without long-term current use of insulin ([place]).  He also  has a past surgical history that includes Carotid stent (01/2018); back surgery (1991); Tonsillectomy; Knee cartilage surgery; Cardiac procedure (N/A, 08/21/2021); Cardiac procedure (N/A, 08/21/2021); Colonoscopy; and Lumbar spine surgery (N/A, 04/16/2022).    Subjective: Pt seated in chair upon arrival with wife and daughter present in room. Says he is doing well and ready to work with PT so that he can leave.     Additional Pertinent History: s/p L3-4 decompression    Social Environment Prior Level of Function   Lives With: Spouse  Type of Home: House  Home Layout: Two level  Home Access: Stairs to enter with rails  Bathroom Toilet: Soil scientist: None  Bathroom Accessibility: Accessible  Home Equipment: None  Entrance Stairs - Number of Steps: 4 steps to enter and 13 to upstairs bedroom  Receives Help From: Family  ADL Assistance: Independent  Homemaking Assistance: Independent  Homemaking Responsibilities: Yes  Ambulation Assistance: Independent  Transfer Assistance: Teacher, English as a foreign language: Yes  Mode of Transportation: Musician  Occupation: Retired   History of Falls: No     Comments:        OBJECTIVE     Vital Signs/Pain Lines/Drains/Precautions   Vital Signs  No vitals taken this session     Pain Pain: 2/10 at incision      Precautions/Restrictions  Restrictions/Precautions  Restrictions/Precautions: Fall Risk, Surgical protocol  Required Braces or Orthoses?: No  Position Activity Restriction  Spinal Precautions: No Bending, No Lifting, No Twisting     Orientation/Cognition Vision/Hearing   Overall Cognitive Status: WNL Vision: Within Functional Limits   Overall Orientation Status: Within Normal Limits  Orientation Level: Oriented X4  Hearing: Within functional limits      Objective Assessment  Gross Assessment   AROM: Within functional limits  Strength: Within functional limits  Sensation: Intact     Balance   Posture: Good  Sitting - Static: Good  Sitting - Dynamic: Good  Standing - Static: Good  Standing - Dynamic: Fair;+      FUNCTIONAL ACTIVITY     Transfer Training   Sit to Stand: Independent  Stand to Sit: Independent     Ambulation   Surface: Level tile  Device: No Device  Assistance: Supervision  Gait Deviations: Slow [place];Decreased step length     Stair Training   # Steps : 12  Stairs Height: 4"  Rails: Right ascending  Device: No Device  Assistance: Supervision      Safety: Call light within reach;Gait belt;Left in chair;Nurse notified (wife and daughter present, RN cleared for up ad lib)     Education:  Education Given To: Patient;Family  Education Provided: Role of Therapy;Plan of Care;Home Exercise  Program;Precautions;Transfer Training  Education Method: Demonstration;Verbal;Printed Information/Hand-outs;Teach Back  Barriers to Learning: None  Education Outcome: Verbalized understanding;Demonstrated understanding    [facility]	[place] AM-PACT "6 Clicks" Basic Mobility Inpatient Short Form   How much help is needed turning from your back to your side while in a flat bed without using bedrails?: None  How much help is needed moving from lying on your back to sitting on  the side of a flat bed without using bedrails?: None  How much help is needed moving to and from a bed to a chair?: None  How much help is needed standing up from a chair using your arms?: None  How much help is needed walking in hospital room?: None  How much help is needed climbing 3-5 steps with a railing?: None  AM-PAC Inpatient Mobility Raw Score : 24  AM-PAC Inpatient T-Scale Score : 61.14  Mobility Inpatient CMS 0-100% Score: 0  Mobility Inpatient CMS G-Code Modifier : CH      ASSESSMENT   Assessment:  Pt doing well s/p L3-4 decompression. Educated pt and family on precautions and HEP. He is independent with all mobility. Says he feels slightly less steady on his feet than normal but no postural sway or LOB noted during session. Wife is a retired PT and will be taking care of him when he returns home. Pt has no further PT or DME needs and is safe to return home with assist from wife.      Evaluation Complexity: Low Complexity      POST ACUTE RECOMMENDATIONS   Setting: No further skilled therapy after discharge from hospital    Equipment: Equipment Needed: No     Discharge Transportation Recommendations: No stretcher       Therapist Signature: Clinton Sawyer, SPT    Date: 04/17/2022

## 2022-04-17 NOTE — Progress Notes (Signed)
Postoperative day 1 status post L3-4 bilateral decompression    S:  Happy to report that his preoperative low back pain lower extremity pain has resolved    O;  Blood pressure 136/70, pulse 78, temperature 98.1 F (36.7 C), temperature source Oral, resp. rate 16, height 1.93 m (6' 3.98"), weight 123.8 kg (272 lb 14.9 oz), SpO2 99 %.     I/O last 3 completed shifts:  In: [id number] [P.O.:810; I.V.:2400]  Out: 2635 [Urine:2125; Drains:210; Blood:300]  No intake/output data recorded.       EXAM:  Constitutional alert and oriented x 3  Manual motor testing 5 out of 5 strength bilateral lower extremities muscle groups  Dressing clean dry and intact   drain productive 210 bloody drainage post-op    A/P:  DC drain DC home after working with physical therapy.

## 2022-04-17 NOTE — Care Coordination-Inpatient (Signed)
04/17/22 0855   Service Assessment   Patient Orientation Alert and Oriented   Cognition Alert   History Provided By Patient   Accompanied By/Relationship spouse and family   Support Systems Spouse/Significant Other   PCP Verified by CM Yes   Prior Functional Level Independent in ADLs/IADLs   Current Functional Level Independent in ADLs/IADLs   Ability to make needs known: Good   Family able to assist with home care needs: Yes   Would you like for me to discuss the discharge plan with any other family members/significant others, and if so, who? Yes   Firefighter None   Social/Functional History   Lives With Spouse   Type of [place] Two level   Home Access Stairs to enter with rails   Entrance Stairs - Number of Steps 4 steps to enter and 13 to upstairs bedroom   [place]   Ambulation Assistance Independent   Active Driver Yes   Occupation Retired   Dentist   Type of [place] Prior To Admission None   Potential Assistance Needed N/A   DME Ordered? No   Potential Assistance Purchasing Medications No   Type of Home Care Services None   Patient expects to be discharged to: House   History of falls? 0   Services At/After Discharge   Transition of Care Consult (CM Consult) N/A   Services At/After Discharge None   Confirm Follow Up Transport Family     Pt uses [facility] no issues   Spouse at bedside    bilateral L3-4 lumbar decompression    PT to eval   No current needs   Plans to dc home today   Cm to follow

## 2022-04-17 NOTE — Progress Notes (Signed)
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 male admitted with Lumbar stenosis with neurogenic claudication.  He  has a past medical history of Eye pressure, GERD (gastroesophageal reflux disease), [place], Hyperlipidemia, Hypertension, Lumbar back pain, OSA on CPAP, Prediabetes, and Type 2 diabetes mellitus with hyperglycemia, without long-term current use of insulin ([place].  He also  has a past surgical history that includes Carotid stent (01/2018); back surgery (1991); Tonsillectomy; Knee cartilage surgery; Cardiac procedure (N/A, 08/21/2021); Cardiac procedure (N/A, 08/21/2021); Colonoscopy; and Lumbar spine surgery (N/A, 04/16/2022).    Subjective:  "I really don't have any pain just lying here."      Additional Pertinent History:        Social Environment  Prior Level of Function ADL  Prior Level of Function IADL    Lives With: Spouse  Type of Home: House  Home Layout: Two level  Home Access: Stairs to enter with rails  Bathroom Toilet: SSoil scientist None  Bathroom Accessibility: Accessible  Home Equipment: None  Ambulation Assistance: Independent  Transfer Assistance: Independent  Receives Help From: Family  ADL Assistance: Independent  Homemaking Assistance: Independent  Homemaking Responsibilities: Yes  Active Driver: Yes  Mode of Transportation: CMusician Occupation: Retired    History of Falls:No    Comments:        OBJECTIVE     Vital Signs / Pain Lines & Drains /Precautions   No vitals taken this session     Pain  Pre Treatment Pain  Screening  Pain at present: 0  Scale Used: Numeric Score   Peripheral IV 04/16/22 [place] (Active)       Precautions/Restrictions  Restrictions/Precautions  Restrictions/Precautions: Fall Risk, Surgical protocol  Required Braces or Orthoses?: No  Position Activity Restriction  Spinal Precautions: No Bending, No Lifting, No Twisting     Orientation/Cognition Vision/Hearing   Orientation  Overall Orientation Status: Within Normal Limits  Orientation Level: Oriented X4  Cognition  Overall Cognitive Status: WNL Vision  Vision: Within Functional Limits  Hearing  Hearing: Within functional limits      Objective Assessment  Gross Assessment    Gross Assessment  AROM: Within functional limits  Strength: Within functional limits  Tone: Normal  Sensation: Intact        Activities of Daily Living     Feeding: Independent  Grooming: Independent  UE Bathing: Independent  LE Bathing: Minimal assistance  UE Dressing: Independent  LE Dressing: Minimal assistance  Functional Mobility: Minimal assistance (Bed>chair t/f)    Equipment Provided: Reacher;Sock aid    Comments:         Functional Activity  EVALUATION ONLY     Safety: Type of Devices: Call light within reach;Chair alarm in place;Patient at risk for falls;Left in chair;Nurse notified (Family present)  Restraints Initially in Place: No     Education: Education Given To: Patient  Education Provided: Equipment  Education Method: Demonstration;Verbal;Printed Information/Hand-outs  Barriers to Learning: None  Education Outcome: Verbalized understanding    [facility]	[place] "  6 Clicks" Basic ADL Inpatient Short Form   How much help is needed for putting on and taking off regular lower body clothing?: A Little  How much help is needed for bathing (which includes washing, rinsing, drying)?: A Little  How much help is needed for toileting (which includes using toilet, bedpan, or urinal)?: A Little  How much help is needed for putting on and taking off  regular upper body clothing?: None  How much help is needed for taking care of personal grooming?: None  How much help for eating meals?: None  AM-PAC Inpatient Daily Activity Raw Score: 21  AM-PAC Inpatient ADL T-Scale Score : 44.27  ADL Inpatient CMS 0-100% Score: 32.79  ADL Inpatient CMS G-Code Modifier : CJ     ASSESSMENT   Assessment   Pt limited by expected pain and post op restrictions.  Pt is appropriate to d/c home with spouse assist prn.      Evaluation Complexity:  Low Complexity      POST ACUTE RECOMMENDATIONS   Setting: No further skilled therapy after discharge from hospital    Justification for Recommended Setting:       Equipment: Equipment Needed: No      PLAN   Frequency and Duration  (Once) for duration of hospital stay or until stated goals are met, whichever comes first.    William [name] presents with Good therapy prognosis. Skilled intervention is medically necessary to address the following performance deficits: Decreased functional mobility , Decreased ADL status.  Benefits and precautions of occupational therapy have been discussed with William [name], and the following interventions are recommended:  .    Goals    Short Term Goals Long Term Goals            Therapist Signature: Marlan Palau, OT    Date: 04/17/2022

## 2022-04-18 ENCOUNTER — Encounter

## 2022-04-18 MED ORDER — OMEPRAZOLE 20 MG PO CPDR
20 | ORAL_CAPSULE | Freq: Every day | ORAL | 3 refills | Status: AC
Start: 2022-04-18 — End: ?

## 2022-04-18 NOTE — Telephone Encounter (Signed)
Per protocol criteria met for refill. Refill sent per physician ordered protocol. See signed orders.

## 2022-04-18 NOTE — Telephone Encounter (Signed)
Patient been taking Gabapentin for a while and he wants to know if he should stop taking it or continue

## 2022-04-22 NOTE — Telephone Encounter (Signed)
Pt notified to continue taking Gabapentin

## 2022-04-23 ENCOUNTER — Encounter

## 2022-04-23 MED ORDER — ROSUVASTATIN CALCIUM 40 MG PO TABS
40 MG | ORAL_TABLET | Freq: Every day | ORAL | 3 refills | Status: DC
Start: 2022-04-23 — End: 2022-06-04

## 2022-04-23 NOTE — Telephone Encounter (Signed)
20 mg sent in error. Pls es 40 mg

## 2022-04-24 ENCOUNTER — Ambulatory Visit: Admit: 2022-04-24 | Discharge: 2022-04-24 | Payer: MEDICARE | Attending: Surgical | Primary: Family Medicine

## 2022-04-24 DIAGNOSIS — Z9889 Other specified postprocedural states: Secondary | ICD-10-CM

## 2022-04-24 NOTE — Progress Notes (Signed)
 CHIEF COMPLAINT: Post op         Date of Surgery:04/16/22  Procedure: Bilateral L3/4 Semi Hemilaminectomy, Medial Facetectomy and Proximal Foraminotomy with Removal of Synovial Cyst and Chronic Organized Epidural Hematoma  Microsurgically Assisted Dissection  Intraoperative Interpretation of Radiographic Images    CMA Notes: Pt is here for a post op visit. Pt says he feels really well.    HISTORY:      Pre-Op:  William [name] is a 72 y.o. male presents complaining of a 3 to 4-week history of bilateral low back pain with pain that radiates lateral aspect of each thigh stopping at the knee made worse with walking and standing better with sitting and stooping over and laying down.  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     Post-Op:  Patient presents happy report that he has no back pain or leg pain whatsoever he has not taken any pain medication since Sunday he is anxious to return to normal life however we did go over postop instructions weight limit restrictions and he verbalized understanding         Medications:    Current Outpatient Medications:     rosuvastatin  (CRESTOR ) 40 MG tablet, Take 1 tablet by mouth daily DOSE INCREASE, Disp: 90 tablet, Rfl: 3    omeprazole  (PRILOSEC) 20 MG delayed release capsule, Take 1 capsule by mouth daily, Disp: 90 capsule, Rfl: 3    Multiple Vitamins-Minerals (CENTRUM VITAMINTS PO), Take 1 tablet by mouth every other day, Disp: , Rfl:     gabapentin  (NEURONTIN ) 300 MG capsule, Take 1 capsule by mouth 2 times daily for 90 days., Disp: 60 capsule, Rfl: 2    carvedilol  (COREG ) 6.25 MG tablet, TAKE 1 TABLET TWICE A DAY  AS DIRECTED (Patient taking differently: Take 1 tablet by mouth 2 times daily), Disp: 180 tablet, Rfl: 1    losartan  (COZAAR ) 100 MG tablet, Take 1 tablet by mouth daily, Disp: , Rfl:     ticagrelor  (BRILINTA ) 90 MG TABS tablet, Take 1 tablet by mouth 2 times daily, Disp: 180 tablet, Rfl: 3    fluticasone  (FLONASE ) 50 MCG/ACT nasal spray, 1 spray by Each  Nostril route daily, Disp: , Rfl:     nitroGLYCERIN  (NITROSTAT ) 0.4 MG SL tablet, as directed Sublingual 1 tab under tongue q 5 minutes x 3 doses chest pain doe not help call 911 (Patient taking differently: Place 1 tablet under the tongue every 5 minutes as needed for Chest pain as directed Sublingual 1 tab under tongue q 5 minutes x 3 doses chest pain doe not help call 911), Disp: 25 tablet, Rfl: 5    Semaglutide , 2 MG/DOSE, (OZEMPIC , 2 MG/DOSE,) 8 MG/3ML SOPN, Inject 2 mg into the skin once a week (Patient taking differently: Inject 0.75 mLs into the skin once a week Tuesday), Disp: 9 mL, Rfl: 3    aspirin  81 MG EC tablet, Take 1 tablet by mouth daily, Disp: , Rfl:     diphenhydrAMINE -APAP, sleep, (TYLENOL  PM EXTRA STRENGTH) 25-500 MG tablet, Take 2 tablets by mouth nightly as needed for Sleep, Disp: , Rfl:     Latanoprost  (XELPROS ) 0.005 % EMUL, Place 1 drop into both eyes every evening, Disp: , Rfl:     Coenzyme Q10 (COQ10 PO), Take 200 mg by mouth every evening (Patient not taking: Reported on 04/24/2022), Disp: , Rfl:     The patient has No Known Allergies.    Past Medical History  [place]  has  a past medical history of Eye pressure, GERD (gastroesophageal reflux disease), Heart attack ([facility]), Hyperlipidemia, Hypertension, Lumbar back pain, OSA on CPAP, Prediabetes, and Type 2 diabetes mellitus with hyperglycemia, without long-term current use of insulin ([facility]).    Past Surgical History  The patient  has a past surgical history that includes Carotid stent (01/2018); back surgery (1991); Tonsillectomy; Knee cartilage surgery; Cardiac procedure (N/A, 08/21/2021); Cardiac procedure (N/A, 08/21/2021); Colonoscopy; and Lumbar spine surgery (N/A, 04/16/2022).    Family History  This patient's family history includes Alzheimer's Disease in his mother; Diabetes in his father; Heart Failure in his father and maternal grandmother; No Known Problems in his maternal grandfather, paternal grandfather, and paternal  grandmother.    Social History  William [name]  reports that he quit smoking about 40 years ago. His smoking use included cigarettes. He started smoking about 55 years ago. He has a 15.0 pack-year smoking history. He has never used smokeless tobacco. He reports current alcohol use of about 6.0 standard drinks of alcohol per week. He reports that he does not use drugs.      ROS:  Review of Systems      EXAMINATION:          General Examination          GENERAL APPEARANCE: well-appearing individual, in no acute distress.             BACK: supple, full range of motion, spine nontender to palpation, no paraspinal muscle spasm.                 SURGICAL SITE: benign       Neurological          MENTAL STATUS alert and oriented X 3, speech fluent, affect appropriate.            ASSESSEMENT AND PLAN:      S/p Lumbar Decompression     The patient reports significant improvement in the preoperative symptoms. We discussed ongoing post operative management, including medications, incision care, activity and rehabilitation. All questions were answered, and I explained that my team and I remain available to address questions or concerns should they arise.     Jerricka Carvey PA-C

## 2022-05-05 ENCOUNTER — Other Ambulatory Visit: Payer: MEDICARE | Primary: Family Medicine

## 2022-05-08 ENCOUNTER — Encounter: Admit: 2022-05-08 | Discharge: 2022-05-08 | Payer: MEDICARE | Attending: Surgical | Primary: Family Medicine

## 2022-05-08 DIAGNOSIS — Z9889 Other specified postprocedural states: Secondary | ICD-10-CM

## 2022-05-08 NOTE — Progress Notes (Signed)
CHIEF COMPLAINT:  Post op       Date of Surgery:04/16/22  Procedure: Bilateral L3/4 Semi Hemilaminectomy, Medial Facetectomy and Proximal Foraminotomy with Removal of Synovial Cyst and Chronic Organized Epidural Hematoma  Microsurgically Assisted Dissection  Intraoperative Interpretation of Radiographic Images    CMA Notes: Pt is here for a post op visit.    HISTORY:      Pre-Op:  Mr. Cronan is a 72 y.o. male presents complaining of a 3 to 4-week history of bilateral low back pain with pain that radiates lateral aspect of each thigh stopping at the knee made worse with walking and standing better with sitting and stooping over and laying down.  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      Post-Op:  Patient presents happy report that he has no back pain or leg pain whatsoever he has not taken any pain medication since Sunday he is anxious to return to normal life however we did go over postop instructions weight limit restrictions and he verbalized understanding         Medications:    Current Outpatient Medications:     rosuvastatin (CRESTOR) 40 MG tablet, Take 1 tablet by mouth daily DOSE INCREASE, Disp: 90 tablet, Rfl: 3    omeprazole (PRILOSEC) 20 MG delayed release capsule, Take 1 capsule by mouth daily, Disp: 90 capsule, Rfl: 3    Multiple Vitamins-Minerals (CENTRUM VITAMINTS PO), Take 1 tablet by mouth every other day, Disp: , Rfl:     gabapentin (NEURONTIN) 300 MG capsule, Take 1 capsule by mouth 2 times daily for 90 days., Disp: 60 capsule, Rfl: 2    carvedilol (COREG) 6.25 MG tablet, TAKE 1 TABLET TWICE A DAY  AS DIRECTED (Patient taking differently: Take 1 tablet by mouth 2 times daily), Disp: 180 tablet, Rfl: 1    losartan (COZAAR) 100 MG tablet, Take 1 tablet by mouth daily, Disp: , Rfl:     ticagrelor (BRILINTA) 90 MG TABS tablet, Take 1 tablet by mouth 2 times daily, Disp: 180 tablet, Rfl: 3    fluticasone (FLONASE) 50 MCG/ACT nasal spray, 1 spray by Each Nostril route daily, Disp: ,  Rfl:     nitroGLYCERIN (NITROSTAT) 0.4 MG SL tablet, as directed Sublingual 1 tab under tongue q 5 minutes x 3 doses chest pain doe not help call 911 (Patient taking differently: Place 1 tablet under the tongue every 5 minutes as needed for Chest pain as directed Sublingual 1 tab under tongue q 5 minutes x 3 doses chest pain doe not help call 911), Disp: 25 tablet, Rfl: 5    Semaglutide, 2 MG/DOSE, (OZEMPIC, 2 MG/DOSE,) 8 MG/3ML SOPN, Inject 2 mg into the skin once a week (Patient taking differently: Inject 0.75 mLs into the skin once a week Tuesday), Disp: 9 mL, Rfl: 3    aspirin 81 MG EC tablet, Take 1 tablet by mouth daily, Disp: , Rfl:     diphenhydrAMINE-APAP, sleep, (TYLENOL PM EXTRA STRENGTH) 25-500 MG tablet, Take 2 tablets by mouth nightly as needed for Sleep, Disp: , Rfl:     Latanoprost (XELPROS) 0.005 % EMUL, Place 1 drop into both eyes every evening, Disp: , Rfl:     The patient has No Known Allergies.    Past Medical History  Shalamar  has a past medical history of Eye pressure, GERD (gastroesophageal reflux disease), Heart attack ([place]), Hyperlipidemia, Hypertension, Lumbar back pain, OSA on CPAP, Prediabetes, and Type 2 diabetes mellitus with hyperglycemia,  without long-term current use of insulin ([place]).    Past Surgical History  The patient  has a past surgical history that includes Carotid stent (01/2018); back surgery (1991); Tonsillectomy; Knee cartilage surgery; Cardiac procedure (N/A, 08/21/2021); Cardiac procedure (N/A, 08/21/2021); Colonoscopy; and Lumbar spine surgery (N/A, 04/16/2022).    Family History  This patient's family history includes Alzheimer's Disease in his mother; Diabetes in his father; Heart Failure in his father and maternal grandmother; No Known Problems in his maternal grandfather, paternal grandfather, and paternal grandmother.    Social History  Geza  reports that he quit smoking about 40 years ago. His smoking use included cigarettes. He started smoking about 55 years ago.  He has a 15.0 pack-year smoking history. He has never used smokeless tobacco. He reports current alcohol use of about 6.0 standard drinks of alcohol per week. He reports that he does not use drugs.      ROS:  Review of Systems      EXAMINATION:          General Examination          GENERAL APPEARANCE: well-appearing individual, in no acute distress.             BACK: supple, full range of motion, spine nontender to palpation, no paraspinal muscle spasm.               SURGICAL SITE: benign       Neurological          MENTAL STATUS alert and oriented X 3, speech fluent, affect appropriate.           MOTOR STRENGTH: full symmetric strength throughout with normal bulk and tone.       ASSESSEMENT AND PLAN:      S/p Lumbar Decompression     The patient reports significant improvement in the preoperative symptoms. We discussed ongoing post operative management, including medications, incision care, activity and rehabilitation. All questions were answered, and I explained that my team and I remain available to address questions or concerns should they arise.     Pollie Meyer PA-C

## 2022-05-22 ENCOUNTER — Ambulatory Visit: Admit: 2022-05-22 | Discharge: 2022-05-22 | Payer: MEDICARE | Attending: Family | Primary: Family Medicine

## 2022-05-22 DIAGNOSIS — M48062 Spinal stenosis, lumbar region with neurogenic claudication: Secondary | ICD-10-CM

## 2022-05-22 NOTE — Progress Notes (Signed)
Post op Note    William [name]   05/01/1950   05/22/2022    Physical Exam:  Patient seen and examined; patient is approximately 6 weeks postop, following an L3-4 lumbar decompression on 04/16/2022.  He reports no pain in his lower back.  He is walking every day for at least 15-minute intervals.  The only thing he reports that when he is standing at the sink or in another area and with a slight bend in his lower back he gets back pain after about 15 minutes.  This resolves with walking around afterwards.  He has no leg pain and no back pain at present.  Alert and cooperative in no acute distress.        Motor strength:   Upper Extremities:                             Right   Left      Deltoid    5/5      5/5      Biceps    5/5    5/5     Triceps     5/5    5/5      WE                 5/5       5/5      FE                  5/5       5/5      Grip     5/5    5/5      Intraosseous  5/5       5/5    No deformities or edema bilaterally  Reflexes: 2+, normal    No clonus or babinski present  Sensation intact to light touch and pin-prick     Lower Extremities:               Right    Left       Iliopsoas        5/5       5/5      Quads        5/5       5/5      Hamstrings        5/5       5/5      Gastrocs        5/5       5/5      Anterior Tibialis  5/5       5/5      EHL         5/5       5/5    No deformities or edema bilaterally  Reflexes: 2+ patellar and Achilles, normal    No clonus or babinski present   Sensation intact to light touch and pin-prick      Gait:  Normal     Assist devices:  None       Cervical spine:  Good range of motion with flexion, extension and rotation.   No limitations.    Back/Thoracic and Lumbar spine:  Good range of motion with flexion and extension.  No limitations.     Incision:    Incision examined, clean, dry and intact.  No redness, drainage or edema.   Appears to be healed without complication    Radiological Findings:  FLUORO  FOR SURGICAL PROCEDURES      Assessment/Plan:     6 weeks  status post L3-4 lumbar decompression on 04/16/2022 by Dr. Raynelle HighlandSteichen.    Overall doing well  No lifting greater than 10   lbs at this time    Follow up  4-5 weeks or as needed.   I suggested to the patient that if he does get any back pain while standing for a long period to apply an ice compress for 15 to 20 minutes and this should help.  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      Bryson CoronaAdele P [facility] PsMoore,FNP-BC MSN  Neurosurgery Nurse Practitioner

## 2022-05-26 MED ORDER — ROSUVASTATIN CALCIUM 20 MG PO TABS
20 MG | ORAL_TABLET | ORAL | 1 refills | Status: AC
Start: 2022-05-26 — End: 2022-06-09

## 2022-05-26 NOTE — Telephone Encounter (Signed)
Pls es

## 2022-06-04 ENCOUNTER — Encounter

## 2022-06-04 MED ORDER — ROSUVASTATIN CALCIUM 40 MG PO TABS
40 MG | ORAL_TABLET | Freq: Every day | ORAL | 3 refills | Status: AC
Start: 2022-06-04 — End: ?

## 2022-06-04 NOTE — Telephone Encounter (Signed)
Pls es (is no longer using mail order)

## 2022-06-04 NOTE — Telephone Encounter (Signed)
Pt called stated he called in for a refill on his (CRESTOR) 20 MG and it was suppose to be for the 40 mg so he was taking two a day to have the 40 mg and is out of medication and would like a refill on the rosuvastatin (CRESTOR) 40 MG tablet taken once daily for 90 day.      [facility] [address] [place] - [address] - P [phone] - F [phone]

## 2022-06-09 ENCOUNTER — Ambulatory Visit: Admit: 2022-06-09 | Discharge: 2022-06-09 | Payer: MEDICARE | Attending: Family Medicine | Primary: Family Medicine

## 2022-06-09 DIAGNOSIS — Z0000 Encounter for general adult medical examination without abnormal findings: Secondary | ICD-10-CM

## 2022-06-09 NOTE — Progress Notes (Signed)
Medicare Annual Wellness Visit    William [name] [name] is here for Medicare AWV (SAWV pt feels like he has plantar fasciatis rt heal. Pt has been stretching without improvement)    Assessment & Plan   Medicare annual wellness visit, subsequent  Essential hypertension  -     CBC with Auto Differential; Future  -     Comprehensive Metabolic Panel; Future  -     Hemoglobin A1C; Future  -     Lipid Panel; Future  -     PSA Screening; Future  -     TSH with Reflex; Future  -     Urinalysis W/ Rflx Microscopic; Future  Gastroesophageal reflux disease without esophagitis  Mixed hyperlipidemia  -     CBC with Auto Differential; Future  -     Comprehensive Metabolic Panel; Future  -     Hemoglobin A1C; Future  -     Lipid Panel; Future  -     PSA Screening; Future  -     TSH with Reflex; Future  -     Urinalysis W/ Rflx Microscopic; Future  Type 2 diabetes mellitus without complication, without long-term current use of insulin (HCC)  -     CBC with Auto Differential; Future  -     Comprehensive Metabolic Panel; Future  -     Hemoglobin A1C; Future  -     Lipid Panel; Future  -     PSA Screening; Future  -     TSH with Reflex; Future  -     Urinalysis W/ Rflx Microscopic; Future  Atherosclerosis of native coronary artery of native heart with angina pectoris (HCC)  -     CBC with Auto Differential; Future  -     Comprehensive Metabolic Panel; Future  -     Hemoglobin A1C; Future  -     Lipid Panel; Future  -     PSA Screening; Future  -     TSH with Reflex; Future  -     Urinalysis W/ Rflx Microscopic; Future  Polyp of colon, unspecified part of colon, unspecified type  Dizzy spells  Prostate cancer screening  -     PSA Screening; Future    Recommendations for Preventive Services Due: see orders and patient instructions/AVS.  Recommended screening schedule for the next 5-10 years is provided to the patient in written form: see Patient Instructions/AVS.    Hypertension hyperlipidemia diabetes stable but will need blood work in 3  months.  Patient has vertigo symptoms we discussed BPPV and he should return if symptoms worsen.  Assessment & Plan  1. Plantar fasciitis.  The patient was educated on the importance of maintaining proper foot position during periods of plantar fasciitis. The application of ice was recommended, and the use of cloth splints was suggested during nighttime use. Should the condition not improve, a referral to Dr. Neoma [name] will be considered.    2. Back pain.  A referral for physical therapy was issued.    3. Vertigo.  The patient was educated on the nature of vertigo. Should the patient experience another episode of vertigo that does not resolve, a 6-week treatment period will be required.    4. Weight management.  The patient was advised to consume smaller portions of food. The use of Ozempic was discussed to aid in weight loss.    Follow-up  The patient is scheduled for a follow-up visit in 09/2022.  Return in about 4 months (around 10/09/2022).     Subjective       Patient's complete Health Risk Assessment and screening values have been reviewed and are found in Flowsheets. The following problems were reviewed today and where indicated follow up appointments were made and/or referrals order    History of Present Illness  The patient presents for evaluation of multiple medical concerns.    The patient has been diagnosed with plantar fasciitis in his right heel. Despite attempts at self-treatment through stretching exercises, the condition has not improved. He is contemplating scheduling an appointment with a podiatrist.    The patient's surgical recovery is progressing well, with no reported pain. He has yet to commence physical therapy, having consulted with William Render PA a few weeks ago. He has a follow-up appointment with William [name] [name] a week after next. He intends to resume his exercise regimen. He previously attended a senior center for nearly a year, during which he lost some weight. However, his plantar  fasciitis prevents him from walking long distances. He is contemplating whether physical therapy for his back will alleviate his symptoms.    The patient reports no adverse effects from his antihypertensive medication.    Approximately 3 to 4 weeks ago, the patient experienced a dizzy spell, a new symptom for him. He describes the dizziness as a spinning sensation when he turned over in bed in the morning, followed by a sensation akin to falling over upon sitting up. He has not experienced any further episodes since then.    The patient is responding well to Ozempic, with no reported side effects such as nausea or bloating. He acknowledges the need to reduce his portion sizes. He has a lifelong history of heavy weight and has attempted weight loss through weight loss programs at [name] and [employer]. Despite his fondness for food, he continues to consume large portions. He intends to incorporate smaller portions into his meals. He successfully reduced his weight to 199 pounds during a physician weight loss program in the mid 1980s. However, upon reverting to a buffet-based diet, he regained the weight. Subsequently, he attended a university weight loss program, which resulted in a weight reduction of 227 to 230 pounds.    Supplemental Information  He had his colonoscopy scheduled for about 2 weeks from now, but he canceled it. He is still taking Brilinta. He had a bad experience with his first colonoscopy, and he ended up in the hospital. He had had it done twice, and he lost so much blood. He really did not want to do it because he is on blood thinner. He was told to cancel it and as soon as he quit taking the blood thinner, they will reschedule it. He is supposed to finish taking the 1-year anniversary on 08/22/2022.        Positive Risk Factor Screenings with Interventions:    Fall Risk:  Do you feel unsteady or are you worried about falling? : no  2 or more falls in past  year?: (!) yes  Fall with injury in past year?: (!) yes     Interventions:    Reviewed medications, home hazards, visual acuity, and co-morbidities that can increase risk for falls             Activity, Diet, and Weight:  On average, how many days per week do you engage in moderate to strenuous exercise (like a brisk walk)?: 2 days  On average, how  many minutes do you engage in exercise at this level?: 20 min    Do you eat balanced/healthy meals regularly?: Yes    Body mass index is 33.37 kg/m. (!) Abnormal    Obesity Interventions:  See A/P for plan and any pertinent orders                               Objective   Vitals:    06/09/22 1438   BP: 130/62   Site: Left Upper Arm   Position: Sitting   Cuff Size: Large Adult   Pulse: 68   Temp: 97.9 F (36.6 C)   TempSrc: Oral   SpO2: 98%   Weight: 124.3 kg (274 lb)   Height: 1.93 m (6' 3.98")      Body mass index is 33.37 kg/m.        General Appearance: alert and oriented to person, place and time, well developed and well- nourished, in no acute distress  Skin: warm and dry, no rash or erythema  Head: normocephalic and atraumatic  Eyes: pupils equal, round, and reactive to light, extraocular eye movements intact, conjunctivae normal  ENT: tympanic membrane, external ear and ear canal normal bilaterally, nose without deformity, nasal mucosa and turbinates normal without polyps  Neck: supple and non-tender without mass, no thyromegaly or thyroid nodules, no cervical lymphadenopathy  Pulmonary/Chest: clear to auscultation bilaterally- no wheezes, rales or rhonchi, normal air movement, no respiratory distress  Cardiovascular: normal rate, regular rhythm, normal S1 and S2, no murmurs, rubs, clicks, or gallops, distal pulses intact, no carotid bruits  Abdomen: soft, non-tender, non-distended, normal bowel sounds, no masses or organomegaly  Extremities: no cyanosis, clubbing or edema  Musculoskeletal: normal range of motion, no joint swelling, deformity or  tenderness  Neurologic: reflexes normal and symmetric, no cranial nerve deficit, gait, coordination and speech normal       No Known Allergies  Prior to Visit Medications    Medication Sig Taking? Authorizing Provider   latanoprost (XALATAN) 0.005 % ophthalmic solution  Yes [provider]   Coenzyme Q10 (COQ-10) 200 MG CAPS Take 1 tablet by mouth nightly Yes [provider]   rosuvastatin (CRESTOR) 40 MG tablet Take 1 tablet by mouth daily DOSE INCREASE Yes Nicholaus Bloom, Sarette J, PA-C   omeprazole (PRILOSEC) 20 MG delayed release capsule Take 1 capsule by mouth daily Yes Hilton, Paige G, APRN - NP   Multiple Vitamins-Minerals (CENTRUM VITAMINTS PO) Take 1 tablet by mouth every other day Yes [provider]   gabapentin (NEURONTIN) 300 MG capsule Take 1 capsule by mouth 2 times daily for 90 days. Yes Steichen, Beulah Gandy, MD   carvedilol (COREG) 6.25 MG tablet TAKE 1 TABLET TWICE A DAY  AS DIRECTED  Patient taking differently: Take 1 tablet by mouth 2 times daily Yes Nicholaus Bloom, Sarette J, PA-C   losartan (COZAAR) 100 MG tablet Take 1 tablet by mouth daily Yes [provider]   ticagrelor (BRILINTA) 90 MG TABS tablet Take 1 tablet by mouth 2 times daily Yes Dalmau, Iona Coach, PA-C   fluticasone (FLONASE) 50 MCG/ACT nasal spray 1 spray by Each Nostril route daily Yes [provider]   nitroGLYCERIN (NITROSTAT) 0.4 MG SL tablet as directed Sublingual 1 tab under tongue q 5 minutes x 3 doses chest pain doe not help call 911  Patient taking differently: Place 1 tablet under the tongue every 5 minutes as needed for Chest pain  as directed Sublingual 1 tab under tongue q 5 minutes x 3 doses chest pain doe not help call 911 Yes Rowe, Nicola Girt., MD   Semaglutide, 2 MG/DOSE, (OZEMPIC, 2 MG/DOSE,) 8 MG/3ML SOPN Inject 2 mg into the skin once a week  Patient taking differently: Inject 2 mg into the skin once a week Tuesday Yes Rowe, Nicola Girt., MD   aspirin 81 MG EC tablet Take 1 tablet by  mouth daily Yes [provider]   diphenhydrAMINE-APAP, sleep, (TYLENOL PM EXTRA STRENGTH) 25-500 MG tablet Take 2 tablets by mouth nightly as needed for Sleep Yes Rsf[facility] Automatic Reconciliation, Rsfh, MD       CareTeam (Including outside providers/suppliers regularly involved in providing care):   Patient Care Team:  Claybon Jabs., MD as PCP - General (Family Medicine)  Phylliss Bob, Nicola Girt., MD as PCP - Empaneled Provider  Katrinka Blazing Earlene Plater, MD as Consulting Physician (Cardiology)     Reviewed and updated this visit:  Tobacco  Allergies  Meds  Problems  Med Hx  Surg Hx  Soc Hx  Fam Hx

## 2022-06-09 NOTE — Patient Instructions (Signed)
Preventing Falls: Care Instructions  Injuries and health problems such as trouble walking or poor eyesight can increase your risk of falling. So can some medicines. But there are things you can do to help prevent falls. You can exercise to get stronger. You can also arrange your home to make it safer.    Talk to your doctor about the medicines you take. Ask if any of them increase the risk of falls and whether they can be changed or stopped.   Try to exercise regularly. It can help improve your strength and balance. This can help lower your risk of falling.     Practice fall safety and prevention.    Wear low-heeled shoes that fit well and give your feet good support. Talk to your doctor if you have foot problems that make this hard.  Carry a cellphone or wear a medical alert device that you can use to call for help.  Use stepladders instead of chairs to reach high objects. Don't climb if you're at risk for falls. Ask for help, if needed.  Wear the correct eyeglasses, if you need them.    Make your home safer.    Remove rugs, cords, clutter, and furniture from walkways.  Keep your house well lit. Use night-lights in hallways and bathrooms.  Install and use sturdy handrails on stairways.  Wear nonskid footwear, even inside. Don't walk barefoot or in socks without shoes.    Be safe outside.    Use handrails, curb cuts, and ramps whenever possible.  Keep your hands free by using a shoulder bag or backpack.  Try to walk in well-lit areas. Watch out for uneven ground, changes in pavement, and debris.  Be careful in the winter. Walk on the grass or gravel when sidewalks are slippery. Use de-icer on steps and walkways. Add non-slip devices to shoes.    Put grab bars and nonskid mats in your shower or tub and near the toilet. Try to use a shower chair or bath bench when bathing.   Get into a tub or shower by putting in your weaker leg first. Get out with your strong side first. Have a phone or medical alert device in  the bathroom with you.   Where can you learn more?  Go to https://www.bennett.info/ and enter G117 to learn more about "Preventing Falls: Care Instructions."  Current as of: July 17, 2023Content Version: 14.0   2006-2024 [employer].   Care instructions adapted under license by [facility]. If you have questions about a medical condition or this instruction, always ask your healthcare professional. [place] any warranty or liability for your use of this information.           Starting a Weight Loss Plan: Care Instructions  Overview     It can be a challenge to lose weight. But your doctor can help you make a weight-loss plan that meets your needs.  You don't have to make a lot of big changes at once. A better idea might be to focus on small changes and stick with them. When those changes become habit, you can add a few more changes.  Some people find it helpful to take an exercise or nutrition class. If you have questions, ask your doctor about seeing a registered dietitian or an exercise specialist. You might also think about joining a weight-loss support group.  If you're not ready to make changes right now, try to pick a date in the future. Then make an  appointment with your doctor to talk about when and how you'll get started with a plan.  Follow-up care is a key part of your treatment and safety. Be sure to make and go to all appointments, and call your doctor if you are having problems. It's also a good idea to know your test results and keep a list of the medicines you take.  How can you care for yourself at home?  Set realistic goals. Many people expect to lose much more weight than is likely. A weight loss of 5% to 10% of your body weight may be enough to improve your health.  Get family and friends involved to provide support. Talk to them about why you are trying to lose weight, and ask them to help. They can help by participating in  exercise and having meals with you, even if they may be eating something different.  Find what works best for you. If you do not have time or do not like to cook, a program that offers meal replacement bars or shakes may be better for you. Or if you like to prepare meals, finding a plan that includes daily menus and recipes may be best.  Ask your doctor about other health professionals who can help you achieve your weight loss goals.  A dietitian can help you make healthy changes in your diet.  An exercise specialist or personal trainer can help you develop a safe and effective exercise program.  A counselor or psychiatrist can help you cope with issues such as depression, anxiety, or family problems that can make it hard to focus on weight loss.  Consider joining a support group for people who are trying to lose weight. Your doctor can suggest groups in your area.  Where can you learn more?  Go to https://www.bennett.info/ and enter U357 to learn more about "Starting a Weight Loss Plan: Care Instructions."  Current as of: September 20, 2023Content Version: 14.0   2006-2024 [employer], [employer].   Care instructions adapted under license by [facility]. If you have questions about a medical condition or this instruction, always ask your healthcare professional. [facility] any warranty or liability for your use of this information.           A Healthy Heart: Care Instructions  Overview     Coronary artery disease, also called heart disease, occurs when a substance called plaque builds up in the vessels that supply oxygen-rich blood to your heart muscle. This can narrow the blood vessels and reduce blood flow. A heart attack happens when blood flow is completely blocked. A high-fat diet, smoking, and other factors increase the risk of heart disease.  Your doctor has found that you have a chance of having heart disease. A heart-healthy lifestyle can help keep your  heart healthy and prevent heart disease. This lifestyle includes eating healthy, being active, staying at a weight that's healthy for you, and not smoking or using tobacco. It also includes taking medicines as directed, managing other health conditions, and trying to get a healthy amount of sleep.  Follow-up care is a key part of your treatment and safety. Be sure to make and go to all appointments, and call your doctor if you are having problems. It's also a good idea to know your test results and keep a list of the medicines you take.  How can you care for yourself at home?  Diet   Use less salt when you cook and eat. This helps lower  your blood pressure. Taste food before salting. Add only a little salt when you think you need it. With time, your taste buds will adjust to less salt.    Eat fewer snack items, fast foods, canned soups, and other high-salt, high-fat, processed foods.    Read food labels and try to avoid saturated and trans fats. They increase your risk of heart disease by raising cholesterol levels.    Limit the amount of solid fat--butter, margarine, and shortening--you eat. Use olive, peanut, or canola oil when you cook. Bake, broil, and steam foods instead of frying them.    Eat a variety of fruit and vegetables every day. Dark green, deep orange, red, or yellow fruits and vegetables are especially good for you. Examples include spinach, carrots, peaches, and berries.    Foods high in fiber can reduce your cholesterol and provide important vitamins and minerals. High-fiber foods include whole-grain cereals and breads, oatmeal, beans, brown rice, citrus fruits, and apples.    Eat lean proteins. Heart-healthy proteins include seafood, lean meats and poultry, eggs, beans, peas, nuts, seeds, and soy products.    Limit drinks and foods with added sugar. These include candy, desserts, and soda pop.   Heart-healthy lifestyle   If your doctor recommends it, get more exercise. For many people,  walking is a good choice. Or you may want to swim, bike, or do other activities. Bit by bit, increase the time you're active every day. Try for at least 30 minutes on most days of the week.    Try to quit or cut back on using tobacco and other nicotine products. This includes smoking and vaping. If you need help quitting, talk to your doctor about stop-smoking programs and medicines. These can increase your chances of quitting for good. Quitting is one of the most important things you can do to protect your heart. It is never too late to quit. Try to avoid secondhand smoke too.    Stay at a weight that's healthy for you. Talk to your doctor if you need help losing weight.    Try to get 7 to 9 hours of sleep each night.    Limit alcohol to 2 drinks a day for men and 1 drink a day for women. Too much alcohol can cause health problems.    Manage other health problems such as diabetes, high blood pressure, and high cholesterol. If you think you may have a problem with alcohol or drug use, talk to your doctor.   Medicines   Take your medicines exactly as prescribed. Call your doctor if you think you are having a problem with your medicine.    If your doctor recommends aspirin, take the amount directed each day. Make sure you take aspirin and not another kind of pain reliever, such as acetaminophen (Tylenol).   When should you call for help?   Call 911 if you have symptoms of a heart attack. These may include:   Chest pain or pressure, or a strange feeling in the chest.    Sweating.    Shortness of breath.    Pain, pressure, or a strange feeling in the back, neck, jaw, or upper belly or in one or both shoulders or arms.    Lightheadedness or sudden weakness.    A fast or irregular heartbeat.   After you call 911, the operator may tell you to chew 1 adult-strength or 2 to 4 low-dose aspirin. Wait for an ambulance. Do not try to drive yourself.  Watch closely for changes in your health, and be sure to contact  your doctor if you have any problems.  Where can you learn more?  Go to [employer] and enter F075 to learn more about "A Healthy Heart: Care Instructions."  Current as of: June 24, 2023Content Version: 14.0   2006-2024 [employer], Inc[employer].   Care instructions adapted under license by [facility]. If you have questions about a medical condition or this instruction, always ask your healthcare professional. Healthwise, Incorporated disclaims any warranty or liability for your use of this information.      Personalized Preventive Plan for William [name] - 06/09/2022  Medicare offers a range of preventive health benefits. Some of the tests and screenings are paid in full while other may be subject to a deductible, co-insurance, and/or copay.    Some of these benefits include a comprehensive review of your medical history including lifestyle, illnesses that may run in your family, and various assessments and screenings as appropriate.    After reviewing your medical record and screening and assessments performed today your provider may have ordered immunizations, labs, imaging, and/or referrals for you.  A list of these orders (if applicable) as well as your Preventive Care list are included within your After Visit Summary for your review.    Other Preventive Recommendations:    A preventive eye exam performed by an eye specialist is recommended every 1-2 years to screen for glaucoma; cataracts, macular degeneration, and other eye disorders.  A preventive dental visit is recommended every 6 months.  Try to get at least 150 minutes of exercise per week or 10,000 steps per day on a pedometer .  Order or download the FREE "Exercise & Physical Activity: Your Everyday Guide" from [employer]. Call [phone] or search [employer] online.  You need 1200-1500 mg of calcium and 1000-2000 IU of vitamin D per day. It is possible to meet  your calcium requirement with diet alone, but a vitamin D supplement is usually necessary to meet this goal.  When exposed to the sun, use a sunscreen that protects against both UVA and UVB radiation with an SPF of 30 or greater. Reapply every 2 to 3 hours or after sweating, drying off with a towel, or swimming.  Always wear a seat belt when traveling in a car. Always wear a helmet when riding a bicycle or motorcycle.

## 2022-06-10 NOTE — Addendum Note (Signed)
Addended by: Danae Orleans on: 06/10/2022 02:08 PM     Modules accepted: Orders

## 2022-06-13 ENCOUNTER — Telehealth

## 2022-06-13 NOTE — Telephone Encounter (Signed)
Referral to podiatry  

## 2022-06-16 ENCOUNTER — Ambulatory Visit: Admit: 2022-06-16 | Discharge: 2022-06-16 | Payer: MEDICARE | Attending: Foot Surgery | Primary: Family Medicine

## 2022-06-16 DIAGNOSIS — M722 Plantar fascial fibromatosis: Secondary | ICD-10-CM

## 2022-06-16 MED ORDER — METHYLPREDNISOLONE ACETATE 40 MG/ML IJ SUSP
40 | Freq: Once | INTRAMUSCULAR | Status: AC
Start: 2022-06-16 — End: 2022-06-16
  Administered 2022-06-16: 19:00:00 40 mg via INTRA_ARTICULAR

## 2022-06-16 NOTE — Progress Notes (Signed)
HPI:  Patient presents to the office today with complaint of pain in the bottom of the right heel.  Relates the pain has been present for 3 weeks.  Relates no injury to the area.  States the pain is increased with for steps in the morning or after any prolonged activity.    PHYSICAL EXAM  Musculoskeletal: On passive dorsiflexion, plantarflexion, inversion, eversion muscle strength is 5/5. There is no evidence of muscle atrophy noted bilaterally. Calcaneal fat pad is intact and shows no evidence of atrophy. On direct palpation along the medial band of the plantar fascia there is significant discomfort expressed by the patient. There is no evidence of fibroma or gapping within the plantar fascia.   On direct palpation of the medial tubercle of the right    calcaneus there is discomfort expressed by the patient. On lateral compression of the calcaneal body there is no discomfort expressed by the patient. On manipulation of the subtalar joint there is no pain or crepitus noted. Ankle joint has good range of motion with approximately 90 degrees dorsiflexion    Neurologic When percussing along the course of the tarsal tunnel there is no evidence of pins-and-needles no evidence of Tinel's sign. Vibratory, proprioception and protective sensation are noted in bilateral feet.     Vascular: dorsalis pedis pulse is palpable bilaterally with the capillary fill time of less than three seconds in all digits bilaterally. Skin temperature is warm .   Orthopedic    Dermatologic: Skin is soft and supple with good skin turgor. No evidence of any open lesions, ulcerations or maceration is noted.     Cognitive: Patient is A&Ox3.     ASSESSMENT  1. Plantar fasciitis of right foot       PLAN  Had a long discussion with patient explaining what plantar fascial strain was and the treatment course for it.         Instructed the patient about doing some home stretching exercises.    Have patient start stretching and icing exercises daily.    Discuss shoe modifications with patient and consider using an arch support    Instructed for the patient to avoid flip-flops, bare feet, slippers and sandals.    Can massage the arch of the foot with a racquetball or golf ball.    Today the patient is given injection of 0.5 mL of 40 mg of Depo-Medrol and 0.5 mL of 2 percent Xylocaine plain into the plantar medial aspect of the right heel        Have patient reappoint back.    Electronically signed by Enis Slipper, DPM on 06/16/2022 at 2:33 PM

## 2022-06-20 MED ORDER — GABAPENTIN 300 MG PO CAPS
300 | ORAL_CAPSULE | Freq: Two times a day (BID) | ORAL | 1 refills | Status: AC
Start: 2022-06-20 — End: 2022-12-17

## 2022-06-20 NOTE — Telephone Encounter (Signed)
I called in his gabapentin refill

## 2022-06-24 ENCOUNTER — Ambulatory Visit: Admit: 2022-06-24 | Discharge: 2022-06-24 | Payer: MEDICARE | Attending: Surgical | Primary: Family Medicine

## 2022-06-24 DIAGNOSIS — M48062 Spinal stenosis, lumbar region with neurogenic claudication: Secondary | ICD-10-CM

## 2022-06-24 NOTE — Progress Notes (Signed)
CHIEF COMPLAINT:Post op         Date of Surgery: 04/16/22  Procedure: Bilateral L3/4 Semi Hemilaminectomy, Medial Facetectomy and Proximal Foraminotomy with Removal of Synovial Cyst and Chronic Organized Epidural Hematoma  Microsurgically Assisted Dissection  Intraoperative Interpretation of Radiographic Images    CMA Notes: Pt is here form a post op visit.    HISTORY: Patient presents happy to report that all of his preoperative symptoms have completely resolved he currently has no complaints and is anxious to get back to his [employer] gym to do his exercises         Medications:    Current Outpatient Medications:     gabapentin (NEURONTIN) 300 MG capsule, Take 1 capsule by mouth 2 times daily for 180 days. Intended supply: 90 days, Disp: 180 capsule, Rfl: 1    latanoprost (XALATAN) 0.005 % ophthalmic solution, , Disp: , Rfl:     Coenzyme Q10 (COQ-10) 200 MG CAPS, Take 1 tablet by mouth nightly, Disp: , Rfl:     rosuvastatin (CRESTOR) 40 MG tablet, Take 1 tablet by mouth daily DOSE INCREASE, Disp: 90 tablet, Rfl: 3    omeprazole (PRILOSEC) 20 MG delayed release capsule, Take 1 capsule by mouth daily, Disp: 90 capsule, Rfl: 3    Multiple Vitamins-Minerals (CENTRUM VITAMINTS PO), Take 1 tablet by mouth every other day, Disp: , Rfl:     carvedilol (COREG) 6.25 MG tablet, TAKE 1 TABLET TWICE A DAY  AS DIRECTED (Patient taking differently: Take 1 tablet by mouth 2 times daily), Disp: 180 tablet, Rfl: 1    losartan (COZAAR) 100 MG tablet, Take 1 tablet by mouth daily, Disp: , Rfl:     ticagrelor (BRILINTA) 90 MG TABS tablet, Take 1 tablet by mouth 2 times daily, Disp: 180 tablet, Rfl: 3    fluticasone (FLONASE) 50 MCG/ACT nasal spray, 1 spray by Each Nostril route daily, Disp: , Rfl:     nitroGLYCERIN (NITROSTAT) 0.4 MG SL tablet, as directed Sublingual 1 tab under tongue q 5 minutes x 3 doses chest pain doe not help call 911 (Patient taking differently: Place 1 tablet under the tongue every 5 minutes as needed for  Chest pain as directed Sublingual 1 tab under tongue q 5 minutes x 3 doses chest pain doe not help call 911), Disp: 25 tablet, Rfl: 5    Semaglutide, 2 MG/DOSE, (OZEMPIC, 2 MG/DOSE,) 8 MG/3ML SOPN, Inject 2 mg into the skin once a week (Patient taking differently: Inject 2 mg into the skin once a week Tuesday), Disp: 9 mL, Rfl: 3    aspirin 81 MG EC tablet, Take 1 tablet by mouth daily, Disp: , Rfl:     diphenhydrAMINE-APAP, sleep, (TYLENOL PM EXTRA STRENGTH) 25-500 MG tablet, Take 2 tablets by mouth nightly as needed for Sleep, Disp: , Rfl:     gabapentin (NEURONTIN) 300 MG capsule, Take 1 capsule by mouth 2 times daily for 90 days., Disp: 60 capsule, Rfl: 2    The patient has No Known Allergies.    Past Medical History  Tramarion  has a past medical history of Eye pressure, GERD (gastroesophageal reflux disease), Heart attack (HCC), Hyperlipidemia, Hypertension, Lumbar back pain, OSA on CPAP, Prediabetes, and Type 2 diabetes mellitus with hyperglycemia, without long-term current use of insulin (HCC).    Past Surgical History  The patient  has a past surgical history that includes Carotid stent (01/2018); back surgery (1991); Tonsillectomy; Knee cartilage surgery; Cardiac procedure (N/A, 08/21/2021); Cardiac procedure (N/A, 08/21/2021); Colonoscopy;  and Lumbar spine surgery (N/A, 04/16/2022).    Family History  This patient's family history includes Alzheimer's Disease in his mother; Diabetes in his father; Heart Failure in his father and maternal grandmother; No Known Problems in his maternal grandfather, paternal grandfather, and paternal grandmother.    Social History  Rajahn  reports that he quit smoking about 40 years ago. His smoking use included cigarettes. He started smoking about 55 years ago. He has a 15.0 pack-year smoking history. He has never used smokeless tobacco. He reports current alcohol use of about 6.0 standard drinks of alcohol per week. He reports that he does not use drugs.      ROS:  Review of  Systems      EXAMINATION:          General Examination          GENERAL APPEARANCE: well-appearing individual, in no acute distress.             BACK: supple, full range of motion, spine nontender to palpation, no paraspinal muscle spasm.                SURGICAL SITE: benign       Neurological          MENTAL STATUS alert and oriented X 3, speech fluent, affect appropriate.           MOTOR STRENGTH: full symmetric strength throughout with normal bulk and tone.               REFLEXES: PATELLAR: 2/2, ACHILLES: 2/2.       ASSESSEMENT AND PLAN:      Status post lumbar decompression    The patient reports significant improvement in the preoperative symptoms. We discussed ongoing post operative management, including medications, incision care, activity and rehabilitation. All questions were answered, and I explained that my team and I remain available to address questions or concerns should they arise.     Mindi Curling PA-C

## 2022-07-07 ENCOUNTER — Encounter: Admit: 2022-07-07 | Discharge: 2022-07-07 | Payer: MEDICARE | Attending: Foot Surgery | Primary: Family Medicine

## 2022-07-07 DIAGNOSIS — M722 Plantar fascial fibromatosis: Secondary | ICD-10-CM

## 2022-07-07 MED ORDER — METHYLPREDNISOLONE ACETATE 40 MG/ML IJ SUSP
40 | Freq: Once | INTRAMUSCULAR | Status: AC
Start: 2022-07-07 — End: 2022-07-07
  Administered 2022-07-07: 15:00:00 40 mg via INTRA_ARTICULAR

## 2022-07-07 MED ORDER — LIDOCAINE HCL 1 % IJ SOLN
1 | Freq: Once | INTRAMUSCULAR | Status: AC
Start: 2022-07-07 — End: 2022-07-07
  Administered 2022-07-07: 15:00:00 1 mL via INTRA_ARTICULAR

## 2022-07-07 NOTE — Progress Notes (Signed)
HPI:  Seen today follow up for pain in the right  heel.  He states he has improved quite a bit but the injection wore off.  Now he has pain once again.  PHYSICAL EXAM:  Musculoskeletal: On passive dorsiflexion, plantarflexion, inversion, eversion muscle strength is 5/5. There is no evidence of muscle atrophy noted bilaterally. There is no evidence of fibroma or gapping within the plantar fascia.   On direct palpation of the medial tubercle of the right calcaneus there is discomfort expressed by the patient. On lateral compression of the calcaneal body there is no discomfort expressed by the patient.      Neurologic When percussing along the course of the tarsal tunnel there is no evidence of pins-and-needles no evidence of Tinel's sign.      Vascular: dorsalis pedis pulse is palpable bilaterally with the capillary fill time of less than three seconds in all digits bilaterally. Skin temperature is warm .   Orthopedic    Dermatologic: Skin is soft and supple with good skin turgor. No evidence of any open lesions, ulcerations or maceration is noted.     Cognitive: Patient is A&Ox3.     ASSESSMENT:  1. Plantar fasciitis of right foot  -     lidocaine 1 % injection 1 mL; 1 mL, Intra-artICUlar, ONCE, 1 dose, On Mon 07/07/22 at 1115  -     methylPREDNISolone acetate (DEPO-MEDROL) injection 40 mg; 40 mg, Intra-artICUlar, ONCE, 1 dose, On Mon 07/07/22 at 1115       PLAN  I discussed with the patient once again about the condition of plantar fasciitis.   Continue doing some stretching exercises every morning and ice in the evening  Discuss shoe modifications with patient and continue   using an arch support    Today the patient is given injection of 0.5 mL of 40 mg of Depo-Medrol and 0.5 mL of 2 percent Xylocaine plain into the plantar medial aspect of the right   heel     I did discuss with the patient about the benefit of physical therapy .  Patient is currently going to be on vacation so we will consider physical therapy in  the future    Have patient reappoint back.     Electronically signed by Enis Slipper, DPM on 07/07/2022 at 10:47 AM

## 2022-07-21 ENCOUNTER — Encounter

## 2022-07-21 NOTE — Telephone Encounter (Signed)
Med was sent in on 04/18/22 for #90 with 3 RF's.  Please notify pt.

## 2022-07-22 ENCOUNTER — Encounter

## 2022-07-22 ENCOUNTER — Telehealth

## 2022-07-22 MED ORDER — BRILINTA 90 MG PO TABS
90 MG | ORAL_TABLET | Freq: Two times a day (BID) | ORAL | 1 refills | Status: AC
Start: 2022-07-22 — End: 2022-08-20

## 2022-07-22 NOTE — Telephone Encounter (Signed)
Med verified per ov 09/19/21; PCI 08/21/21. Pt has a  little over a month on Brilinta. Limited rx sent.    Pt needs appt.     Clerance Lav

## 2022-07-22 NOTE — Telephone Encounter (Signed)
Pls es

## 2022-07-23 MED ORDER — OZEMPIC (2 MG/DOSE) 8 MG/3ML SC SOPN
83 MG/3ML | SUBCUTANEOUS | 1 refills | Status: AC
Start: 2022-07-23 — End: ?

## 2022-07-28 ENCOUNTER — Inpatient Hospital Stay: Admit: 2022-07-28 | Discharge: 2022-08-05 | Payer: MEDICARE | Attending: Internal Medicine | Primary: Family Medicine

## 2022-07-28 DIAGNOSIS — I2510 Atherosclerotic heart disease of native coronary artery without angina pectoris: Secondary | ICD-10-CM

## 2022-08-05 LAB — ECHO (TTE) COMPLETE (PRN CONTRAST/BUBBLE/STRAIN/3D)
AV Area by Peak Velocity: 3.5 cm2
AV Area by VTI: 3.4 cm2
AV Mean Gradient: 2 mmHg
AV Mean Velocity: 0.7 m/s
AV Peak Gradient: 3 mmHg
AV Peak Velocity: 0.9 m/s
AV VTI: 23.6 cm
AV Velocity Ratio: 0.89
AVA/BSA Peak Velocity: 1.4 cm2/m2
AVA/BSA VTI: 1.4 cm2/m2
Ao Root Index: 1.53 cm/m2
Aortic Root: 3.8 cm
Ascending Aorta Index: 1.45 cm/m2
Ascending Aorta: 3.6 cm
Body Surface Area: 2.53 m2
E/E' Lateral: 9.38
E/E' Ratio (Averaged): 10.04
E/E' Septal: 10.71
EF BP: 54 % — AB (ref 55–100)
Est. RA Pressure: 3 mmHg
Fractional Shortening 2D: 36 % (ref 28–44)
Global Longitudinal Strain: -20.9 %
IVC Expiration: 1.4 cm
IVSd: 1.3 cm — AB (ref 0.6–1.0)
LA Diameter: 4.3 cm
LA Size Index: 1.73 cm/m2
LA Volume A-L A4C: 60 mL — AB (ref 18–58)
LA Volume A-L A4C: 62 mL — AB (ref 18–58)
LA Volume A/L: 64 mL
LA Volume BP: 58 mL (ref 18–58)
LA Volume Index A-L A2C: 24 mL/m2 (ref 16–34)
LA Volume Index A-L A4C: 25 mL/m2 (ref 16–34)
LA Volume Index A/L: 26 mL/m2 (ref 16–34)
LA Volume Index BP: 23 ml/m2 (ref 16–34)
LA Volume Index MOD A2C: 23 ml/m2 (ref 16–34)
LA Volume Index MOD A4C: 22 ml/m2 (ref 16–34)
LA Volume MOD A2C: 57 mL (ref 18–58)
LA Volume MOD A4C: 54 mL (ref 18–58)
LA/AO Root Ratio: 1.13
LV E' Lateral Velocity: 8 cm/s
LV E' Septal Velocity: 7 cm/s
LV EDV A2C: 166 mL
LV EDV A4C: 157 mL
LV EDV BP: 165 mL — AB (ref 67–155)
LV EDV Index A2C: 67 mL/m2
LV EDV Index A4C: 63 mL/m2
LV EDV Index BP: 67 mL/m2
LV ESV A2C: 74 mL
LV ESV A4C: 78 mL
LV ESV BP: 76 mL — AB (ref 22–58)
LV ESV Index A2C: 30 mL/m2
LV ESV Index A4C: 31 mL/m2
LV ESV Index BP: 31 mL/m2
LV Ejection Fraction A2C: 55 %
LV Ejection Fraction A2C: 58 %
LV Ejection Fraction A4C: 50 %
LV Ejection Fraction A4C: 57 %
LV Mass 2D Index: 76.3 g/m2 (ref 49–115)
LV Mass 2D: 189.2 g (ref 88–224)
LV RWT Ratio: 0.57
LVIDd Index: 1.69 cm/m2
LVIDd: 4.2 cm (ref 4.2–5.9)
LVIDs Index: 1.09 cm/m2
LVIDs: 2.7 cm
LVOT Area: 3.8 cm2
LVOT Diameter: 2.2 cm
LVOT Mean Gradient: 1 mmHg
LVOT Peak Gradient: 3 mmHg
LVOT Peak Velocity: 0.8 m/s
LVOT SV: 77.5 ml
LVOT Stroke Volume Index: 31.3 mL/m2
LVOT VTI: 20.4 cm
LVOT:AV VTI Index: 0.86
LVPWd: 1.2 cm — AB (ref 0.6–1.0)
MV A Velocity: 0.66 m/s
MV E Velocity: 0.75 m/s
MV E Wave Deceleration Time: 237.5 ms
MV E/A: 1.14
RV Free Wall Peak S': 12 cm/s
RVIDd: 4.7 cm
RVSP: 6 mmHg
TAPSE: 1.9 cm (ref 1.7–?)
TR Max Velocity: 0.9 m/s
TR Peak Gradient: 3 mmHg

## 2022-08-20 ENCOUNTER — Ambulatory Visit: Admit: 2022-08-20 | Discharge: 2022-08-20 | Payer: MEDICARE | Attending: Internal Medicine | Primary: Family Medicine

## 2022-08-20 DIAGNOSIS — I2510 Atherosclerotic heart disease of native coronary artery without angina pectoris: Secondary | ICD-10-CM

## 2022-08-20 NOTE — Progress Notes (Signed)
Review of Systems   Constitutional:  Negative for chills, fatigue, fever and unexpected weight change.   HENT:  Negative for hearing loss, nosebleeds, sore throat, tinnitus and voice change.    Eyes:  Negative for visual disturbance.   Respiratory:  Negative for apnea, cough, chest tightness, shortness of breath and wheezing.    Cardiovascular:  Negative for chest pain, palpitations and leg swelling.   Gastrointestinal:  Negative for anal bleeding, blood in stool, constipation and diarrhea.   Endocrine: Negative for polydipsia.   Genitourinary:  Negative for difficulty urinating, dysuria, hematuria and urgency.   Musculoskeletal:  Negative for myalgias.   Skin:  Negative for rash and wound.   Neurological:  Negative for dizziness, syncope, weakness, light-headedness and numbness.   Hematological:  Does not bruise/bleed easily.   Psychiatric/Behavioral:  Negative for sleep disturbance.

## 2022-08-20 NOTE — Progress Notes (Signed)
Date:  August 20, 2022  Patient name: William [name]  Date of Birth: [birth year]      REASON FOR CLINIC VISIT: Follow-up Follow-up (MEDICATION /)        HISTORY OF PRESENT ILLNESS        The patient is a 72 y.o. male who returns for follow-up.   He is doing well at today's visit.  He has no exertional chest pain or shortness of breath.  No significant lower extremity edema, PND or orthopnea.  He has completed nearly 1 full year of DAPT.    PAST HISTORY          Past Medical History:   has a past medical history of Eye pressure, GERD (gastroesophageal reflux disease), Heart attack (HCC), Hyperlipidemia, Hypertension, Lumbar back pain, OSA on CPAP, Prediabetes, and Type 2 diabetes mellitus with hyperglycemia, without long-term current use of insulin (HCC).    Past Surgical History:   has a past surgical history that includes Carotid stent (01/2018); back surgery (1991); Tonsillectomy; Knee cartilage surgery; Cardiac procedure (N/A, 08/21/2021); Cardiac procedure (N/A, 08/21/2021); Colonoscopy; and Lumbar spine surgery (N/A, 04/16/2022).     Social History:   reports that he quit smoking about 40 years ago. His smoking use included cigarettes. He started smoking about 55 years ago. He has a 15.0 pack-year smoking history. He has never used smokeless tobacco. He reports current alcohol use of about 6.0 standard drinks of alcohol per week. He reports that he does not use drugs.     Family History: family history includes Alzheimer's Disease in his mother; Diabetes in his father; Heart Failure in his father and maternal grandmother; No Known Problems in his maternal grandfather, paternal grandfather, and paternal grandmother.    Allergies:  Patient has no known allergies.    Home Medications:    Prior to Admission medications    Medication Sig Start Date End Date Taking? Authorizing Provider   OZEMPIC, 2 MG/DOSE, 8 MG/3ML SOPN sc injection INJECT 2 MG UNDER THE SKIN ONCE WEEKLY 07/23/22  Yes Alen Bleacher J, PA-C    gabapentin (NEURONTIN) 300 MG capsule Take 1 capsule by mouth 2 times daily for 180 days. Intended supply: 90 days 06/20/22 12/17/22 Yes Sharyn Blitz, PA   latanoprost (XALATAN) 0.005 % ophthalmic solution  06/06/22  Yes [provider]   Coenzyme Q10 (COQ-10) 200 MG CAPS Take 1 tablet by mouth nightly   Yes [provider]   rosuvastatin (CRESTOR) 40 MG tablet Take 1 tablet by mouth daily DOSE INCREASE 06/04/22  Yes Crisoforo Oxford, PA-C   omeprazole (PRILOSEC) 20 MG delayed release capsule Take 1 capsule by mouth daily 04/18/22  Yes Hilton, Paige G, APRN - NP   Multiple Vitamins-Minerals (CENTRUM VITAMINTS PO) Take 1 tablet by mouth every other day   Yes [provider]   carvedilol (COREG) 6.25 MG tablet TAKE 1 TABLET TWICE A DAY  AS DIRECTED  Patient taking differently: Take 1 tablet by mouth 2 times daily 11/25/21  Yes Nicholaus Bloom, Sarette J, PA-C   losartan (COZAAR) 100 MG tablet Take 1 tablet by mouth daily   Yes [provider]   fluticasone (FLONASE) 50 MCG/ACT nasal spray 1 spray by Each Nostril route daily   Yes [provider]   nitroGLYCERIN (NITROSTAT) 0.4 MG SL tablet as directed Sublingual 1 tab under tongue q 5 minutes x 3 doses chest pain doe not help call 911  Patient taking differently: Place 1 tablet under the tongue  every 5 minutes as needed for Chest pain as directed Sublingual 1 tab under tongue q 5 minutes x 3 doses chest pain doe not help call 911 06/06/21  Yes Rowe, Nicola Girt., MD   aspirin 81 MG EC tablet Take 1 tablet by mouth daily   Yes [provider]   diphenhydrAMINE-APAP, sleep, (TYLENOL PM EXTRA STRENGTH) 25-500 MG tablet Take 2 tablets by mouth nightly as needed for Sleep   Yes [facility]con[facility], [facility], MD   gabapentin (NEURONTIN) 300 MG capsule Take 1 capsule by mouth 2 times daily for 90 days. 03/20/22 06/18/22  Charlett Blake, MD         REVIEW OF SYSTEMS      Review of Systems   Constitutional:  Negative for chills,  fatigue, fever and unexpected weight change.   HENT:  Negative for hearing loss, nosebleeds, sore throat, tinnitus and voice change.    Eyes:  Negative for visual disturbance.   Respiratory:  Negative for apnea, cough, chest tightness, shortness of breath and wheezing.    Cardiovascular:  Negative for chest pain, palpitations and leg swelling.   Gastrointestinal:  Negative for anal bleeding, blood in stool, constipation and diarrhea.   Endocrine: Negative for polydipsia.   Genitourinary:  Negative for difficulty urinating, dysuria, hematuria and urgency.   Musculoskeletal:  Negative for myalgias.   Skin:  Negative for rash and wound.   Neurological:  Negative for dizziness, syncope, weakness, light-headedness and numbness.   Hematological:  Does not bruise/bleed easily.   Psychiatric/Behavioral:  Negative for sleep disturbance.           PHYSICAL EXAM            BP 110/70 (Site: Right Upper Arm, Position: Sitting, Cuff Size: Large Adult)   Pulse 75   Resp 20   Wt 120.5 kg (265 lb 9.6 oz)   SpO2 97%   BMI 33.20 kg/m      GEN: NAD  HEENT: NCAT  NECK: No JVD, No carotid bruits, normal carotid upstrokes  CV: RRR, no murmurs, normal S1, S2  Lungs: CTAB  Abdomen: soft, NT, ND  Extremities: No edema  Pulses: 2+  Neuro: non-focal  Psych: appropriate    DIAGNOSTIC STUDIES           Most recent EKG:   Encounter Date: 06/09/22   EKG 12 Lead    Impression    NSR       Last Echo result: 07/28/22    ECHO (TTE) COMPLETE (PRN CONTRAST/BUBBLE/STRAIN/3D) 08/04/2022 11:02 PM (Final)    Interpretation Summary    Left Ventricle: Low normal left ventricular systolic function with a visually estimated EF of 55 - 60%. Left ventricle size is normal. Mildly increased wall thickness. Normal wall motion. Global longitudinal strain is normal. Abnormal diastolic function.    Left Atrium: Left atrium is mildly dilated.    Image quality is good.    Normal LV systolic function  No significant valvular stenosis or regurgitation  Grade 1  diastolic dysfunction    Signed by: Arnette Felts, MD on 08/04/2022 11:02 PM      Last Stress Test: No results found for this or any previous visit.       Last Cardiac Cath: 08/21/21    CARDIAC PROCEDURE 08/22/2021  8:42 AM (Final)    Conclusion  Obstructive 2 vessel  Recent episode of chest pain at home  First pictures had less than TIMI 1 flow however this improved with repeat  injections    RCA has minimal disease  Patent LCX stent  LAD and diagonal have high grade >90% stenoses that are heavily calcified on IVUS    Successful PCI with DES after non-compliant, cutting and shockwave IVL angioplasty    Normal LVEDP      Recommend DAPT x 1 year    Signed by: Arnette Felts, MD on 08/22/2021  8:42 AM       Most Recent Labs:  Lab Results   Component Value Date    WBC 5.9 04/05/2022    HGB 13.0 04/05/2022    HCT 40.0 04/05/2022    MCV 93.7 04/05/2022    PLT 171 04/05/2022      Lab Results   Component Value Date/Time    NA 141 12/31/2021 08:01 AM    K 4.4 12/31/2021 08:01 AM    CL 104 12/31/2021 08:01 AM    CO2 27 12/31/2021 08:01 AM    BUN 23 12/31/2021 08:01 AM    CREATININE 0.8 12/31/2021 08:01 AM    GLUCOSE 106 12/31/2021 08:01 AM    CALCIUM 9.3 12/31/2021 08:01 AM    LABGLOM 93 12/31/2021 08:01 AM       Lab Results   Component Value Date    CHOL 127 12/31/2021    CHOL 113 09/04/2021    CHOL 141 06/04/2021     Lab Results   Component Value Date/Time    ALKPHOS 41 12/31/2021 08:01 AM    ALT 19 12/31/2021 08:01 AM    AST 19 12/31/2021 08:01 AM    BILITOT 0.45 12/31/2021 08:01 AM    BILIDIR <0.20 04/05/2018 07:03 AM      Lab Results   Component Value Date    TRIG 102 12/31/2021    TRIG 100 09/04/2021    TRIG 119 06/04/2021     Lab Results   Component Value Date    HDL 55 12/31/2021    HDL 49 09/04/2021    HDL 59 06/04/2021     No components found for: "LDLCHOLESTEROL", "LDLCALC"  Lab Results   Component Value Date    VLDL 20.4 12/31/2021    VLDL 20.0 09/04/2021    VLDL 23.8 06/04/2021     Lab Results    Component Value Date    CHOLHDLRATIO 2.3 12/31/2021    CHOLHDLRATIO 2.3 09/04/2021    CHOLHDLRATIO 2.4 06/04/2021          ASSESSMENT / RECOMMENDATIONS        1. Atherosclerosis of native coronary artery of native heart without angina pectoris  2. Ischemic cardiomyopathy  Overview:  Added automatically from request for surgery [id number]    3. Coronary artery disease involving native coronary artery of native heart without angina pectoris  4. Obstructive sleep apnea syndrome     Will go ahead and stop the Brilinta today.  Blood pressure and pulse look good.  He appears euvolemic.  He remains on high intensity statin.  He has labs ordered for lipid panel coming up.  Tentatively we will see him back in 1 year    Arnette Felts, MD   [facility] / [facility]

## 2022-09-09 LAB — HM DIABETES EYE EXAM: Diabetic Retinopathy: NEGATIVE

## 2022-10-09 ENCOUNTER — Encounter

## 2022-10-09 ENCOUNTER — Encounter: Payer: MEDICARE | Attending: Family Medicine | Primary: Family Medicine

## 2022-10-09 LAB — CBC WITH AUTO DIFFERENTIAL
Basophils %: 0.8 % (ref 0.0–2.0)
Basophils Absolute: 0 10*3/uL (ref 0.0–0.2)
Eosinophils %: 1.8 % (ref 0.0–7.0)
Eosinophils Absolute: 0.1 10*3/uL (ref 0.0–0.5)
Hematocrit: 37.9 % — ABNORMAL LOW (ref 38.0–52.0)
Hemoglobin: 12.6 g/dL — ABNORMAL LOW (ref 13.0–17.3)
Immature Grans (Abs): 0.01 10*3/uL (ref 0.00–0.06)
Immature Granulocytes %: 0.2 % (ref 0.0–0.6)
Lymphocytes Absolute: 1 10*3/uL (ref 1.0–3.2)
Lymphocytes: 21.2 % (ref 15.0–45.0)
MCH: 30.9 pg (ref 27.0–34.5)
MCHC: 33.2 g/dL (ref 30.0–36.0)
MCV: 92.9 fL (ref 84.0–100.0)
MPV: 11.6 fL (ref 7.0–12.2)
Monocytes %: 8.8 % (ref 4.0–12.0)
Monocytes Absolute: 0.4 10*3/uL (ref 0.3–1.0)
NRBC Absolute: 0 10*3/uL (ref 0.000–0.012)
NRBC Automated: 0 % (ref 0.0–0.2)
Neutrophils %: 67.2 % (ref 42.0–74.0)
Neutrophils Absolute: 3.3 10*3/uL (ref 1.6–7.3)
Platelets: 170 10*3/uL (ref 140–440)
RBC: 4.08 x10e6/mcL (ref 4.00–5.60)
RDW: 12.9 % (ref 10.0–17.0)
WBC: 4.9 10*3/uL (ref 3.8–10.6)

## 2022-10-09 LAB — COMPREHENSIVE METABOLIC PANEL
ALT: 19 U/L (ref 0–50)
AST: 22 U/L (ref 0–50)
Albumin/Globulin Ratio: 1.5 (ref 1.00–2.70)
Albumin: 4.1 g/dL (ref 3.5–5.2)
Alk Phosphatase: 47 U/L (ref 40–130)
Anion Gap: 10 mmol/L (ref 2–17)
BUN: 17 mg/dL (ref 8–23)
CO2: 28 mmol/L (ref 22–29)
Calcium: 9.5 mg/dL (ref 8.5–10.7)
Chloride: 104 mmol/L (ref 98–107)
Creatinine: 0.8 mg/dL (ref 0.7–1.3)
Est, Glom Filt Rate: 93 mL/min/1.73mÂ² (ref >=60)
Globulin: 2.7 g/dL (ref 1.9–4.4)
Glucose: 111 mg/dL — ABNORMAL HIGH (ref 70–99)
Osmolaliy Calculated: 285 mosm/kg (ref 270–287)
Potassium: 4.2 mmol/L (ref 3.5–5.3)
Sodium: 142 mmol/L (ref 135–145)
Total Bilirubin: 0.43 mg/dL (ref 0.00–1.20)
Total Protein: 6.8 g/dL (ref 5.7–8.3)

## 2022-10-09 LAB — HEMOGLOBIN A1C
Estimated Avg Glucose: 123
Estimated Avg Glucose: 133
Hemoglobin A1C: 5.9 % (ref 4.0–6.0)

## 2022-10-09 LAB — LIPID PANEL
Chol/HDL Ratio: 2.4 (ref 0.0–4.4)
Cholesterol, Total: 130 mg/dL (ref 100–200)
HDL: 55 mg/dL (ref >=40)
LDL Cholesterol: 57.6 mg/dL (ref 0.0–100.0)
LDL/HDL Ratio: 1
Triglycerides: 87 mg/dL (ref 0–149)
VLDL: 17.4 mg/dL (ref 5.0–40.0)

## 2022-10-09 LAB — TSH REFLEX TO FT4: TSH: 2.48 u[IU]/mL (ref 0.358–3.740)

## 2022-10-09 LAB — PSA SCREENING: PSA, Screening: 1.21 ng/mL (ref 0.000–4.000)

## 2022-10-09 NOTE — Other (Signed)
We will review labs at upcoming appointment.Call us if you need to reschedule.

## 2022-10-14 ENCOUNTER — Ambulatory Visit
Admit: 2022-10-14 | Discharge: 2022-10-14 | Payer: Medicare Other | Attending: Family Medicine | Primary: Family Medicine

## 2022-10-14 VITALS — BP 126/62 | HR 68 | Temp 98.50000°F | Ht 75.0 in | Wt 267.6 lb

## 2022-10-14 DIAGNOSIS — M5442 Lumbago with sciatica, left side: Secondary | ICD-10-CM

## 2022-10-14 MED ORDER — CARVEDILOL 6.25 MG PO TABS
6.25 | ORAL_TABLET | ORAL | 1 refills | Status: DC
Start: 2022-10-14 — End: 2023-03-10

## 2022-10-14 MED ORDER — GABAPENTIN 300 MG PO CAPS
300 | ORAL_CAPSULE | Freq: Two times a day (BID) | ORAL | 1 refills | Status: DC
Start: 2022-10-14 — End: 2023-04-17

## 2022-10-14 MED ORDER — OMEPRAZOLE 20 MG PO CPDR
20 | ORAL_CAPSULE | Freq: Every day | ORAL | 3 refills | Status: DC
Start: 2022-10-14 — End: 2023-04-17

## 2022-10-14 MED ORDER — OZEMPIC (2 MG/DOSE) 8 MG/3ML SC SOPN
83 | SUBCUTANEOUS | 1 refills | Status: DC
Start: 2022-10-14 — End: 2023-04-17

## 2022-10-14 MED ORDER — ROSUVASTATIN CALCIUM 40 MG PO TABS
40 | ORAL_TABLET | Freq: Every day | ORAL | 3 refills | Status: DC
Start: 2022-10-14 — End: 2023-04-17

## 2022-10-14 NOTE — Progress Notes (Signed)
 William [name] (DOB:  [birth year]) is a 72 y.o. male, here for evaluation of the following chief complaint(s):  Follow-up (Pt stated he went to Dr. Ying Ladson for eye exams.  Not sure if DM exam.  )        Assessment & Plan   ASSESSMENT/PLAN:  1. Acute bilateral low back pain with bilateral sciatica  -     gabapentin  (NEURONTIN ) 300 MG capsule; Take 1 capsule by mouth 2 times daily for 90 days. Intended supply: 90 days, Disp-90 capsule, R-1Normal  2. Essential hypertension  -     carvedilol  (COREG ) 6.25 MG tablet; TAKE 1 TABLET TWICE A DAY  AS DIRECTED, Disp-180 tablet, R-1Normal  -     CBC with Auto Differential; Future  -     Comprehensive Metabolic Panel; Future  3. Gastroesophageal reflux disease without esophagitis  -     omeprazole  (PRILOSEC) 20 MG delayed release capsule; Take 1 capsule by mouth daily, Disp-90 capsule, R-3Normal  4. Type 2 diabetes mellitus without complication, without long-term current use of insulin (HCC)  -     semaglutide , 2 MG/DOSE, (OZEMPIC , 2 MG/DOSE,) 8 MG/3ML SOPN sc injection; Inject 2 mg into the skin every 7 days, Disp-9 mL, R-1Normal  -     Hemoglobin A1C; Future  5. Mixed hyperlipidemia  -     rosuvastatin  (CRESTOR ) 40 MG tablet; Take 1 tablet by mouth daily DOSE INCREASE, Disp-90 tablet, R-3Normal  -     Lipid Panel; Future  6. Drug therapy  -     Comprehensive Metabolic Panel; Future    Results  Laboratory Studies  Blood sugar 111. A1c is 5.9. Liver tests are normal. Thyroid test normal. White blood cell count normal. Platelet count normal. Hemoglobin has shifted down a little bit.      Assessment & Plan  1. Health Maintenance.  Electrolyte levels, kidney function, liver function, thyroid function, white blood cell count, and platelet count are all within normal ranges. Hemoglobin level has slightly decreased, but last iron studies in February 2024 were satisfactory. He is due for a tetanus vaccine in November 2023. He is advised to monitor blood pressure bi-weekly and  schedule a colonoscopy. He is recommended to receive COVID-19, influenza, and RSV vaccines this fall.     2. Diabetes Mellitus.  Blood glucose level is 111, and A1c is 5.9, slightly higher than his usual range of 5.5 to 5.7. He is advised to reduce intake of processed grains and carbohydrates to aid in weight loss. A1c levels will be monitored every 6 months.    3. Hypertension.  Blood pressure today was 126/62, which is lower than usual. He is advised to monitor blood pressure at home every two weeks to ensure it remains within baseline. If symptoms of low blood pressure such as weakness or lightheadedness occur, adjustments to medication may be necessary.    4. Glaucoma.  He continues to use latanoprost  eye drops every night for eye pressure, which is now within the normal range. He is advised to ensure his eye doctor is aware of his history of elevated A1c and diabetes to monitor for any changes consistent with diabetes.    5. Post-Surgical Care.  He had Mohs surgery on his face on September 18, 2022, which has healed well. No further action is required unless new symptoms develop.    6. Medication Management.  Gabapentin  prescription will be refilled, but he is advised to take it as needed. If he does not require  it after a month, he can discontinue its use.         No follow-ups on file.       Subjective   SUBJECTIVE/OBJECTIVE:  HPI     History of Present Illness  The patient presents for evaluation of multiple medical concerns.    He reports overall good health, with no new or concerning symptoms. He has been actively engaging in physical activities such as walking and climbing stairs at home. He reports no chest pain, pressure, breathing difficulties, weakness, or lightheadedness during these activities. He also reports no joint issues. His blood pressure reading today was 126/62, which is lower than his usual readings, but he is not experiencing any symptoms of hypotension. He does not monitor his blood pressure  at home.    He had a colonoscopy scheduled for February or March 2024, but it was postponed due to his use of Brilinta  following stent placement in July 2023. He plans to reschedule the colonoscopy now that he is no longer on Brilinta . He visits his ophthalmologist, Dr. Fernand Howard, twice a year and uses latanoprost  nightly for eye pressure management. He reports no presence of blood in his stool or other bleeding issues. He has not undergone any recent procedures or surgeries, except for Mohs surgery on his face on September 18, 2022. He takes Centrum vitamin every other day and is considering increasing the frequency to daily. He underwent an echocardiogram since his last visit, which showed normal results according to Dr. Felipe Horton.    He does not monitor his blood glucose levels at home. He is currently on a half dose of 2 mg once a week, which appears to be effective. He still has needles left over from a previous research study. He reports no new symptoms related to his diabetes medication.    He was previously taking gabapentin  twice daily, but this was reduced to once daily at night by Dr. Renald Carol PA. He is unsure if he needs a refill. He reports no back pain and has not experienced any radiating leg pain since his surgery on April 16, 2022. He has not tried skipping the gabapentin .    IMMUNIZATIONS  He has received shingles vaccine.       Prior to Admission medications    Medication Sig Start Date End Date Taking? Authorizing Provider   carvedilol  (COREG ) 6.25 MG tablet TAKE 1 TABLET TWICE A DAY  AS DIRECTED 10/14/22  Yes Rowe, Clarine Cromer., MD   gabapentin  (NEURONTIN ) 300 MG capsule Take 1 capsule by mouth 2 times daily for 90 days. Intended supply: 90 days 10/14/22 01/12/23 Yes Rowe, Clarine Cromer., MD   omeprazole  [facility]) 20 MG delayed release capsule Take 1 capsule by mouth daily 10/14/22  Yes Rowe, Elliannah Wayment R Jr., MD   semaglutide , 2 MG/DOSE, (OZEMPIC , 2 MG/DOSE,) 8 MG/3ML SOPN sc injection Inject 2 mg into the skin every  7 days 10/14/22  Yes Rowe, Clarine Cromer., MD   rosuvastatin  (CRESTOR ) 40 MG tablet Take 1 tablet by mouth daily DOSE INCREASE 10/14/22  Yes Rowe, Clarine Cromer., MD   latanoprost  (XALATAN ) 0.005 % ophthalmic solution  06/06/22  Yes [provider]   Coenzyme Q10 (COQ-10) 200 MG CAPS Take 2 tablets by mouth nightly   Yes [provider]   Multiple Vitamins-Minerals (CENTRUM VITAMINTS PO) Take 1 tablet by mouth every other day   Yes [provider]   losartan  (COZAAR ) 100 MG tablet Take 1 tablet by mouth daily  Yes [provider]   fluticasone  (FLONASE ) 50 MCG/ACT nasal spray 1 spray by Each Nostril route daily   Yes [provider]   nitroGLYCERIN  (NITROSTAT ) 0.4 MG SL tablet as directed Sublingual 1 tab under tongue q 5 minutes x 3 doses chest pain doe not help call 911  Patient taking differently: Place 1 tablet under the tongue every 5 minutes as needed for Chest pain as directed Sublingual 1 tab under tongue q 5 minutes x 3 doses chest pain doe not help call 911 06/06/21  Yes Rowe, Clarine Cromer., MD   aspirin  81 MG EC tablet Take 1 tablet by mouth daily   Yes [provider]   diphenhydrAMINE -APAP, sleep, (TYLENOL  PM EXTRA STRENGTH) 25-500 MG tablet Take 2 tablets by mouth nightly as needed for Sleep   Yes [facility]conciliation, [facility], MD       No Known Allergies    Past Medical History:   Diagnosis Date    Eye pressure     GERD (gastroesophageal reflux disease)     Heart attack (HCC) 05/2021    Hyperlipidemia     Hypertension     Lumbar back pain     OSA on CPAP     Prediabetes     Type 2 diabetes mellitus with hyperglycemia, without long-term current use of insulin (HCC) 04/08/2022       Past Surgical History:   Procedure Laterality Date    BACK SURGERY  1991    CARDIAC PROCEDURE N/A 08/21/2021    Left heart cath performed by Janyth Meres, MD at [facility]ARD[facility]    CARDIAC PROCEDURE N/A 08/21/2021    Insert stent des coronary performed by Janyth Meres, MD at [facility]    CAROTID STENT  01/2018    COLONOSCOPY      KNEE CARTILAGE SURGERY      LUMBAR SPINE SURGERY N/A 04/16/2022    BILATERAL L3/4 LUMBAR DECOMPRESSION performed by Lindley Rhein, MD at [facility] MAIN OR    MOHS SURGERY Right 09/18/2022    [facility] mohs rt side of face.    TONSILLECTOMY           Family History   Problem Relation Age of Onset    Alzheimer's Disease Mother         TIA    Heart Failure Father         DM    Diabetes Father         Died in August 06, 1994    Heart Failure Maternal Grandmother     No Known Problems Maternal Grandfather     No Known Problems Paternal Grandmother     No Known Problems Paternal Grandfather        Social History     Tobacco Use    Smoking status: Former     Current packs/day: 0.00     Average packs/day: 0.6 packs/day for 26.6 years (15.0 ttl pk-yrs)     Types: Cigarettes     Start date: 10/07/1966     Quit date: 08-06-1982     Years since quitting: 40.6    Smokeless tobacco: Never   Vaping Use    Vaping status: Never Used   Substance Use Topics    Alcohol use: Yes     Alcohol/week: 6.0 standard drinks of alcohol     Types: 6 Drinks containing 0.5 oz of alcohol per week    Drug use: Never  Review of Systems   All other systems reviewed and are negative.       Objective       Vitals:    10/14/22 1103 10/14/22 1105   BP: 126/62 126/62   Site: Left Upper Arm Left Upper Arm   Position: Sitting Sitting   Cuff Size: Large Adult Large Adult   Pulse:  68   Temp:  98.5 F (36.9 C)   TempSrc:  Oral   SpO2:  98%   Weight: 121.4 kg (267 lb 9.6 oz) 121.4 kg (267 lb 9.6 oz)   Height: 1.905 m (6\' 3" ) 1.905 m (6\' 3" )        BP Readings from Last 3 Encounters:   10/14/22 126/62   08/20/22 110/70   06/09/22 130/62     Wt Readings from Last 3 Encounters:   10/14/22 121.4 kg (267 lb 9.6 oz)   08/20/22 120.5 kg (265 lb 9.6 oz)   07/28/22 120.7 kg (266 lb)       Physical Exam  Constitutional:       General: He is not in acute distress.     Appearance: He is  not toxic-appearing.   HENT:      Head: Normocephalic.   Pulmonary:      Effort: Pulmonary effort is normal.      Comments: Speaking comfortably in full sentences  Neurological:      Mental Status: He is oriented to person, place, and time.   Psychiatric:         Behavior: Behavior normal.                Orders Only on 10/09/2022   Component Date Value Ref Range Status    TSH 10/09/2022 2.480  0.358 - 3.740 mcIU/mL Final    Comment: TSH INTERPRETATION:    Controversy exists over an acceptable TSH range. Some experts argue that a  narrower reference range is better and will increase the detection of thyroid  disease, particularly marginal hypothyroidism.      PSA, Screening 10/09/2022 1.210  0.000 - 4.000 ng/mL Final    Comment: PSA INTERPRETATION:    PSA measured by Roche [employer] electrochemiluminescence immunoassay "ECLIA"  methodology.    At this time, no major scientific/medical organization including the American  Cancer Society and the American Urological Association have specific  recommendations in regard to prostate specific antigen (PSA) testing other  than recommending that men at age 36 and above and those who are at high risk  at age 71-45 and above be counseled in regard to the pros and cons of PSA  testing.    Guidelines for risk stratification    1.  Below 1:  Very low risk of prostate cancer  2.  1-4:       Approximately 15% risk of prostate cancer  3.  4-10:      25% risk of prostate cancer  4.  >10:       50% risk of prostate cancer    The use of PSA velocity (rate of rise of PSA over time) may also be useful in  predicting the risk of prostate cancer.  Most authorities recommend PSA  testing on at least three occasions over a period of 18 months to get an  accurate PSA velocity.    References                            available by  request.      Cholesterol, Total 10/09/2022 130  100 - 200 mg/dL Final    Comment: The National Cholesterol Education Program has published reference  cholesterol values  for cardiovascular risk to be:    Less than 200 mg/dL     = Low Risk    865 to 239 mg/dL        = Borderline Risk    240mg /dL and greater    = High Risk      HDL 10/09/2022 55  >=40 mg/dL Final    Comment: The National Lipid Association and the [employer] Cholesterol Education Program  (NCEP) have set the guidelines for high-density lipoprotein (HDL) cholesterol  in adults ages 88 and up.      Triglycerides 10/09/2022 87  0 - 149 mg/dL Final    Comment:   TRIGLYCERIDE INTERPRETATION:                          Recommended Fasting Triglyc Levels for Adults                        =============================================                        Desirable                        < 150 mg/dL                        Average                          < 200 mg/dL                        Borderline High             200 to 500 mg/dL                        Hypertriglyceridemic             > 500 mg/dL                        =============================================      LDL Cholesterol 10/09/2022 57.6  0.0 - 100.0 mg/dL Final    LDL/HDL Ratio 10/09/2022 1.0   Final    Chol/HDL Ratio 10/09/2022 2.4  0.0 - 4.4 Final    VLDL 10/09/2022 17.4  5.0 - 40.0 mg/dL Final    Hemoglobin H8I 10/09/2022 5.9  4.0 - 6.0 % Final    Comment: HEMOGLOBIN A1C INTERPRETATION:    The following arbitrary ranges may be used for interpretation of the results.  However, factors such as duration of diabetes, adherence to therapy, and  patient age should also be considered in assessing degree of blood glucose  control.    Hemoglobin A1C                 Avg. Blood Sugar  --------------------------------------------------------------  6%                           135 mg/dL  7%  170 mg/dL  8%                           205 mg/dL  9%                           240 mg/dL  16%                          275 mg/dL    ======================================================    A1C                      Glucose  Control  ----------------------------------------------------------------  < 6.0 %                   Normal  6.0 - 6.9 %               Abnormal  7.0 - 7.9 %               Sub-Optimal Control  > 8.0 %                   Inadequate Control      Estimated Avg Glucose 10/09/2022 123   Final    Estimated Avg Glucose 10/09/2022 133   Final    Sodium 10/09/2022 142  135 - 145 mmol/L Final    Potassium 10/09/2022 4.2  3.5 - 5.3 mmol/L Final    Chloride 10/09/2022 104  98 - 107 mmol/L Final    CO2 10/09/2022 28  22 - 29 mmol/L Final    Glucose 10/09/2022 111 (H)  70 - 99 mg/dL Final    BUN 10/96/0454 17  8 - 23 mg/dL Final    Creatinine 09/81/1914 0.8  0.7 - 1.3 mg/dL Final    Anion Gap 78/29/5621 10  2 - 17 mmol/L Final    Osmolaliy Calculated 10/09/2022 285  270 - 287 mOsm/kg Final    Calcium  10/09/2022 9.5  8.5 - 10.7 mg/dL Final    Total Protein 10/09/2022 6.8  5.7 - 8.3 g/dL Final    Albumin 30/86/5784 4.1  3.5 - 5.2 g/dL Final    Globulin 69/62/9528 2.7  1.9 - 4.4 g/dL Final    Albumin/Globulin Ratio 10/09/2022 1.50  1.00 - 2.70 Final    Total Bilirubin 10/09/2022 0.43  0.00 - 1.20 mg/dL Final    Alk Phosphatase 10/09/2022 47  40 - 130 unit/L Final    AST 10/09/2022 22  0 - 50 unit/L Final    ALT 10/09/2022 19  0 - 50 unit/L Final    Est, Glom Filt Rate 10/09/2022 93  >=60 mL/min/1.64m Final    Comment: VERIFIED by Discern Expert.  GFR Interpretation:                                                                         % OF  KIDNEY  GFR  STAGE  FUNCTION  ==================================================================================    > 90        Normal kidney function                       STAGE 1  90-100%  89 to 60      Mild loss of kidney function                 STAGE 2  80-60%  59 to 45      Mild to moderate loss of kidney function     STAGE 3a  59-45%  44 to 30      Moderate to severe loss of kidney function   STAGE 3b  44-30%  29 to 15      Severe loss  of kidney function               STAGE 4  29-15%    < 15        Kidney failure                               STAGE 5  <15%  ==================================================================================  Modified from National Kidney Foundation    GFR Calculation performed using the CKD-EPI 2021 equation developed for use  with IDMS traceable creatinine methods and                            is the calculation recommended by  the [facility] for estimating GFR in adults.      WBC 10/09/2022 4.9  3.8 - 10.6 x10e3/mcL Final    RBC 10/09/2022 4.08  4.00 - 5.60 x10e6/mcL Final    Hemoglobin 10/09/2022 12.6 (L)  13.0 - 17.3 g/dL Final    Hematocrit 21/30/8657 37.9 (L)  38.0 - 52.0 % Final    MCV 10/09/2022 92.9  84.0 - 100.0 fL Final    MCH 10/09/2022 30.9  27.0 - 34.5 pg Final    MCHC 10/09/2022 33.2  30.0 - 36.0 g/dL Final    RDW 84/69/6295 12.9  10.0 - 17.0 % Final    Platelets 10/09/2022 170  140 - 440 x10e3/mcL Final    MPV 10/09/2022 11.6  7.0 - 12.2 fL Final    NRBC Automated 10/09/2022 0.0  0.0 - 0.2 % Final    NRBC Absolute 10/09/2022 0.000  0.000 - 0.012 x10e3/mcL Final    Neutrophils % 10/09/2022 67.2  42.0 - 74.0 % Final    Lymphocytes 10/09/2022 21.2  15.0 - 45.0 % Final    Monocytes % 10/09/2022 8.8  4.0 - 12.0 % Final    Eosinophils % 10/09/2022 1.8  0.0 - 7.0 % Final    Basophils % 10/09/2022 0.8  0.0 - 2.0 % Final    Neutrophils Absolute 10/09/2022 3.3  1.6 - 7.3 x10e3/mcL Final    Lymphocytes Absolute 10/09/2022 1.0  1.0 - 3.2 x10e3/mcL Final    Monocytes Absolute 10/09/2022 0.4  0.3 - 1.0 x10e3/mcL Final    Eosinophils Absolute 10/09/2022 0.1  0.0 - 0.5 x10e3/mcL Final    Basophils Absolute 10/09/2022 0.0  0.0 - 0.2 x10e3/mcL Final    Immature Granulocytes % 10/09/2022 0.2  0.0 - 0.6 % Final    Immature Grans (Abs) 10/09/2022 0.01  0.00 - 0.06 x10e3/mcL Final      No results found for this visit on 10/14/22.  The patient (or guardian, if applicable) and other individuals in  attendance with the patient were advised that Artificial Intelligence will be utilized during this visit to record and process the conversation to generate a clinical note. The patient (or guardian, if applicable) and other individuals in attendance at the appointment consented to the use of AI, including the recording.                          An electronic signature was used to authenticate this note.    --Naomie Back, MD

## 2022-11-10 MED ORDER — LOSARTAN POTASSIUM 100 MG PO TABS
100 MG | ORAL_TABLET | Freq: Every day | ORAL | 1 refills | Status: DC
Start: 2022-11-10 — End: 2023-04-17

## 2022-11-10 NOTE — Telephone Encounter (Signed)
 Pls es

## 2022-12-24 NOTE — Telephone Encounter (Signed)
Eye examine 2024 recordered

## 2023-02-26 MED ORDER — LATANOPROST 0.005 % OP SOLN
0.005 | OPHTHALMIC | 11 refills | Status: AC
Start: 2023-02-26 — End: ?

## 2023-02-26 NOTE — Telephone Encounter (Signed)
 Pls es

## 2023-03-10 ENCOUNTER — Encounter

## 2023-03-10 MED ORDER — CARVEDILOL 6.25 MG PO TABS
6.25 MG | ORAL_TABLET | ORAL | 1 refills | Status: DC
Start: 2023-03-10 — End: 2023-04-17

## 2023-03-10 NOTE — Telephone Encounter (Signed)
To office, needs clarification of dispense amount of coreg for  the small amount

## 2023-03-10 NOTE — Telephone Encounter (Signed)
 Pls es

## 2023-04-13 ENCOUNTER — Encounter

## 2023-04-13 LAB — CBC WITH AUTO DIFFERENTIAL
Basophils %: 0.7 % (ref 0.0–2.0)
Basophils Absolute: 0 10*3/uL (ref 0.0–0.2)
Eosinophils %: 1.4 % (ref 0.0–7.0)
Eosinophils Absolute: 0.1 10*3/uL (ref 0.0–0.5)
Hematocrit: 38.1 % (ref 38.0–52.0)
Hemoglobin: 12.3 g/dL — ABNORMAL LOW (ref 13.0–17.3)
Immature Grans (Abs): 0.01 10*3/uL (ref 0.00–0.06)
Immature Granulocytes %: 0.2 % (ref 0.0–0.6)
Lymphocytes Absolute: 1.1 10*3/uL (ref 1.0–3.2)
Lymphocytes: 24.5 % (ref 15.0–45.0)
MCH: 30.3 pg (ref 27.0–34.5)
MCHC: 32.3 g/dL (ref 30.0–36.0)
MCV: 93.8 fL (ref 84.0–100.0)
MPV: 11.3 fL (ref 7.0–12.2)
Monocytes %: 8.3 % (ref 4.0–12.0)
Monocytes Absolute: 0.4 10*3/uL (ref 0.3–1.0)
NRBC Absolute: 0 10*3/uL (ref 0.000–0.012)
NRBC Automated: 0 % (ref 0.0–0.2)
Neutrophils %: 64.9 % (ref 42.0–74.0)
Neutrophils Absolute: 2.8 10*3/uL (ref 1.6–7.3)
Platelets: 156 10*3/uL (ref 140–440)
RBC: 4.06 x10e6/mcL (ref 4.00–5.60)
RDW: 13.2 % (ref 10.0–17.0)
WBC: 4.3 10*3/uL (ref 3.8–10.6)

## 2023-04-13 LAB — URINALYSIS W/ RFLX MICROSCOPIC
Bilirubin, Urine: NEGATIVE
Blood, Urine: NEGATIVE
Glucose, Ur: NEGATIVE
Ketones, Urine: NEGATIVE
Leukocyte Esterase, Urine: NEGATIVE
Nitrite, Urine: NEGATIVE
Protein, UA: NEGATIVE
Specific Gravity, UA: 1.025 (ref 1.003–1.035)
Urobilinogen, Urine: 0.2 EU/dL (ref 0.2–1.0)
pH, Urine: 6 (ref 4.5–8.0)

## 2023-04-13 LAB — HEMOGLOBIN A1C
Estimated Avg Glucose: 111
Estimated Avg Glucose: 118
Hemoglobin A1C: 5.5 % (ref 4.0–6.0)

## 2023-04-13 LAB — COMPREHENSIVE METABOLIC PANEL
ALT: 49 U/L — ABNORMAL HIGH (ref 0–42)
AST: 35 U/L (ref 0–46)
Albumin/Globulin Ratio: 1.8 (ref 1.00–2.70)
Albumin: 4.2 g/dL (ref 3.5–5.2)
Alk Phosphatase: 46 U/L (ref 40–130)
Anion Gap: 9 mmol/L (ref 2–17)
BUN: 23 mg/dL (ref 8–23)
CO2: 27 mmol/L (ref 22–29)
Calcium: 9.4 mg/dL (ref 8.5–10.7)
Chloride: 109 mmol/L — ABNORMAL HIGH (ref 98–107)
Creatinine: 0.9 mg/dL (ref 0.7–1.3)
Est, Glom Filt Rate: 90 mL/min/1.73mÂ² (ref >=60)
Globulin: 2.4 g/dL (ref 1.9–4.4)
Glucose: 111 mg/dL — ABNORMAL HIGH (ref 70–99)
Osmolaliy Calculated: 293 mosm/kg — ABNORMAL HIGH (ref 270–287)
Potassium: 4.8 mmol/L (ref 3.5–5.3)
Sodium: 145 mmol/L (ref 135–145)
Total Bilirubin: 0.44 mg/dL (ref 0.00–1.20)
Total Protein: 6.6 g/dL (ref 5.7–8.3)

## 2023-04-13 LAB — LIPID PANEL
Chol/HDL Ratio: 2.1 (ref 0.0–4.4)
Cholesterol, Total: 130 mg/dL (ref 100–200)
HDL: 61 mg/dL (ref >=40)
LDL Cholesterol: 54 mg/dL (ref 0.0–100.0)
LDL/HDL Ratio: 0.9
Triglycerides: 75 mg/dL (ref 0–149)
VLDL: 15 mg/dL (ref 5.0–40.0)

## 2023-04-13 NOTE — Other (Signed)
 We will review labs at upcoming appointment.Call us if you need to reschedule.

## 2023-04-17 ENCOUNTER — Ambulatory Visit: Admit: 2023-04-17 | Discharge: 2023-04-17 | Payer: MEDICARE | Attending: Family Medicine | Primary: Family Medicine

## 2023-04-17 ENCOUNTER — Encounter

## 2023-04-17 VITALS — BP 138/60 | HR 66 | Temp 98.40000°F | Ht 75.0 in | Wt 256.6 lb

## 2023-04-17 DIAGNOSIS — R419 Unspecified symptoms and signs involving cognitive functions and awareness: Secondary | ICD-10-CM

## 2023-04-17 MED ORDER — GABAPENTIN 300 MG PO CAPS
300 | ORAL_CAPSULE | Freq: Two times a day (BID) | ORAL | 1 refills | Status: AC
Start: 2023-04-17 — End: 2023-07-16

## 2023-04-17 MED ORDER — OZEMPIC (2 MG/DOSE) 8 MG/3ML SC SOPN
8 | SUBCUTANEOUS | 1 refills | 84.00000 days | Status: DC
Start: 2023-04-17 — End: 2023-08-10

## 2023-04-17 MED ORDER — GABAPENTIN 300 MG PO CAPS
300 | ORAL_CAPSULE | Freq: Two times a day (BID) | ORAL | 1 refills | Status: DC
Start: 2023-04-17 — End: 2023-04-17

## 2023-04-17 MED ORDER — NITROGLYCERIN 0.4 MG SL SUBL
0.4 | ORAL_TABLET | SUBLINGUAL | 5 refills | Status: DC
Start: 2023-04-17 — End: 2023-04-17

## 2023-04-17 MED ORDER — ROSUVASTATIN CALCIUM 40 MG PO TABS
40 | ORAL_TABLET | Freq: Every day | ORAL | 1 refills | Status: DC
Start: 2023-04-17 — End: 2023-04-17

## 2023-04-17 MED ORDER — LOSARTAN POTASSIUM 100 MG PO TABS
100 | ORAL_TABLET | Freq: Every day | ORAL | 1 refills | Status: DC
Start: 2023-04-17 — End: 2023-04-17

## 2023-04-17 MED ORDER — LOSARTAN POTASSIUM 100 MG PO TABS
100 | ORAL_TABLET | Freq: Every day | ORAL | 1 refills | Status: DC
Start: 2023-04-17 — End: 2023-08-27

## 2023-04-17 MED ORDER — OZEMPIC (2 MG/DOSE) 8 MG/3ML SC SOPN
83 | SUBCUTANEOUS | 1 refills | Status: DC
Start: 2023-04-17 — End: 2023-04-17

## 2023-04-17 MED ORDER — CARVEDILOL 6.25 MG PO TABS
6.25 | ORAL_TABLET | ORAL | 1 refills | 30.00000 days | Status: DC
Start: 2023-04-17 — End: 2023-09-18

## 2023-04-17 MED ORDER — OMEPRAZOLE 20 MG PO CPDR
20 | ORAL_CAPSULE | Freq: Every day | ORAL | 1 refills | Status: DC
Start: 2023-04-17 — End: 2023-08-27

## 2023-04-17 MED ORDER — CARVEDILOL 6.25 MG PO TABS
6.25 | ORAL_TABLET | ORAL | 1 refills | Status: DC
Start: 2023-04-17 — End: 2023-04-17

## 2023-04-17 MED ORDER — OMEPRAZOLE 20 MG PO CPDR
20 | ORAL_CAPSULE | Freq: Every day | ORAL | 1 refills | Status: DC
Start: 2023-04-17 — End: 2023-04-17

## 2023-04-17 MED ORDER — NITROGLYCERIN 0.4 MG SL SUBL
0.4 | ORAL_TABLET | SUBLINGUAL | 5 refills | Status: AC
Start: 2023-04-17 — End: ?

## 2023-04-17 MED ORDER — ROSUVASTATIN CALCIUM 40 MG PO TABS
40 | ORAL_TABLET | Freq: Every day | ORAL | 1 refills | Status: DC
Start: 2023-04-17 — End: 2023-08-31

## 2023-04-17 NOTE — Progress Notes (Signed)
 William [name] (DOB:  1950-10-28) is a 73 y.o. male, here for evaluation of the following chief complaint(s):  Follow-up (6 month)        Assessment & Plan   ASSESSMENT/PLAN:  1. Cognitive complaints  -     Artis Delay -  Neuropsychology  -     Vitamin B12; Future  2. Essential hypertension  3. Acute bilateral low back pain with bilateral sciatica  4. Gastroesophageal reflux disease without esophagitis  5. Mixed hyperlipidemia  6. Type 2 diabetes mellitus without complication, without long-term current use of insulin (HCC)  7. Elevated transaminase level  -     Hepatic Function Panel; Future      Assessment & Plan  1. Cognitive impairment.  He reports recent memory issues, including forgetting conversations and events. There is a family history of dementia and Alzheimer's disease. Over-the-counter memory supplements were discussed and deemed ineffective. A referral to Dr. Theodis Aguas, a neuropsychologist, will be made for comprehensive neurocognitive testing. If the test results indicate abnormalities, further evaluations such as MRIs and blood tests, including B12 levels, will be conducted to identify any reversible causes of memory loss. He is advised to discontinue the use of Tylenol PM due to its association with cognitive decline and instead continue with gabapentin for sleep management.    2. Elevated ALT levels.  Recent blood work showed an elevated ALT level of 49, which could be due to various factors such as a cold, alcohol intake, or medication use. A follow-up blood test will be ordered in 4 weeks to monitor the ALT levels. He is advised to avoid alcohol and unnecessary medications that could affect liver function.    3. Medication management.  He is currently taking gabapentin every other day to help with sleep. He is advised to continue taking gabapentin nightly and discontinue the use of Tylenol PM due to its potential impact on cognitive function.    Follow-up  The patient will follow up in June  2025, post-evaluation with Dr. Theodis Aguas.    PROCEDURE  Colonoscopy was performed on 11/18/2021.       No follow-ups on file.       Subjective   SUBJECTIVE/OBJECTIVE:  HPI     History of Present Illness  The patient presents for evaluation of memory issues.    He reports a general sense of well-being, with no specific complaints at this time. He has experienced weight loss, which he attributes to his regular exercise regimen of walking 2.5 miles on the treadmill three times a week. He does not experience any chest pain, pressure, or heaviness during these exercise sessions. His previous symptoms of blockage included severe pain between his stomach and rib cage, which occurred during a cruise, and indigestion. However, he has not experienced any recurrence of these symptoms. He also reports no respiratory, gastrointestinal, or urinary issues.    His memory has been declining, particularly for recent events. For instance, he forgot about a phone conversation he had the previous day, although he was able to recall it after being reminded. He also forgot about a recipe he had prepared two months ago, even after watching the related TV show again. He expresses concern about developing dementia or Alzheimer's disease, given his mother's history of these conditions. He has noticed a decrease in his patience, especially when driving. He is considering over-the-counter memory supplements and is seeking advice on this matter.    He has been gradually reducing his gabapentin dosage, now taking it every  other day, and finds it beneficial for his sleep. He can discern a difference in his sleep quality when he takes it versus when he does not. He typically falls asleep quickly but often wakes up around 3:00 AM and remains awake for about an hour.    Supplemental Information  He had a colonoscopy done on 11/18/2021.    FAMILY HISTORY  His mother had dementia and Alzheimer's.    MEDICATIONS  Current: gabapentin, Tylenol  PM    IMMUNIZATIONS  He received a COVID-19 vaccine on 03/24/2022 and has had all boosters.       Prior to Admission medications    Medication Sig Start Date End Date Taking? Authorizing Provider   latanoprost (XALATAN) 0.005 % ophthalmic solution INSTILL 1 DROP IN BOTH EYES AT BEDTIME 02/26/23  Yes Nicholaus Bloom, Sarette J, PA-C   Coenzyme Q10 (COQ-10) 200 MG CAPS Take 2 tablets by mouth nightly   Yes [provider]   Multiple Vitamins-Minerals (CENTRUM VITAMINTS PO) Take 1 tablet by mouth every other day   Yes [provider]   fluticasone (FLONASE) 50 MCG/ACT nasal spray 1 spray by Each Nostril route daily   Yes [provider]   aspirin 81 MG EC tablet Take 1 tablet by mouth daily   Yes [provider]   carvedilol (COREG) 6.25 MG tablet TAKE 1 TABLET TWICE A DAY  AS DIRECTED 04/17/23   Claybon Jabs., MD   losartan (COZAAR) 100 MG tablet Take 1 tablet by mouth daily 04/17/23   Claybon Jabs., MD   nitroGLYCERIN (NITROSTAT) 0.4 MG SL tablet as directed Sublingual 1 tab under tongue q 5 minutes x 3 doses chest pain doe not help call 911 04/17/23   Claybon Jabs., MD   omeprazole (PRILOSEC) 20 MG delayed release capsule Take 1 capsule by mouth daily 04/17/23   Claybon Jabs., MD   rosuvastatin (CRESTOR) 40 MG tablet Take 1 tablet by mouth daily 04/17/23   Claybon Jabs., MD   semaglutide, 2 MG/DOSE, (OZEMPIC, 2 MG/DOSE,) 8 MG/3ML SOPN sc injection Inject 2 mg into the skin every 7 days 04/17/23   Claybon Jabs., MD   gabapentin (NEURONTIN) 300 MG capsule Take 1 capsule by mouth 2 times daily for 90 days. Intended supply: 90 days 04/17/23 07/16/23  Claybon Jabs., MD       No Known Allergies    Past Medical History:   Diagnosis Date    Eye pressure     GERD (gastroesophageal reflux disease)     Heart attack (HCC) 05/2021    Hyperlipidemia     Hypertension     Lumbar back pain     OSA on CPAP     Prediabetes     Type 2 diabetes mellitus with hyperglycemia, without long-term current  use of insulin (HCC) 04/08/2022       Past Surgical History:   Procedure Laterality Date    BACK SURGERY  1991    CARDIAC PROCEDURE N/A 08/21/2021    Left heart cath performed by Arnette Felts, MD at [facility] CARDI[facility]    CARDIAC PROCEDURE N/A 08/21/2021    Insert stent des coronary performed by Arnette Felts, MD at [facility]    CAROTID STENT  01/2018    COLONOSCOPY      KNEE CARTILAGE SURGERY      LUMBAR SPINE SURGERY N/A 04/16/2022    BILATERAL L3/4  LUMBAR DECOMPRESSION performed by Charlett Blake, MD at [facility] MAIN OR    MOHS SURGERY Right 09/18/2022    [facility] mohs rt side of face.    TONSILLECTOMY           Family History   Problem Relation Age of Onset    Alzheimer's Disease Mother         TIA    Heart Failure Father         DM    Diabetes Father         Died in May 05, 1994    Heart Failure Maternal Grandmother     No Known Problems Maternal Grandfather     No Known Problems Paternal Grandmother     No Known Problems Paternal Grandfather        Social History     Tobacco Use    Smoking status: Former     Current packs/day: 0.00     Average packs/day: 0.6 packs/day for 26.6 years (15.0 ttl pk-yrs)     Types: Cigarettes     Start date: 10/07/1966     Quit date: 05-May-1982     Years since quitting: 41.1    Smokeless tobacco: Never   Vaping Use    Vaping status: Never Used   Substance Use Topics    Alcohol use: Yes     Alcohol/week: 6.0 standard drinks of alcohol     Types: 6 Drinks containing 0.5 oz of alcohol per week    Drug use: Never          Review of Systems     Objective       Vitals:    04/17/23 1048   BP: 138/60   Pulse: 66   Temp: 98.4 F (36.9 C)   SpO2: 98%   Weight: 116.4 kg (256 lb 9.6 oz)   Height: 1.905 m (6\' 3" )        BP Readings from Last 3 Encounters:   04/17/23 138/60   10/14/22 126/62   08/20/22 110/70     Wt Readings from Last 3 Encounters:   04/17/23 116.4 kg (256 lb 9.6 oz)   10/14/22 121.4 kg (267 lb 9.6 oz)   08/20/22 120.5 kg (265 lb 9.6 oz)       Physical  Exam           Orders Only on 04/13/2023   Component Date Value Ref Range Status    Hemoglobin A1C 04/13/2023 5.5  4.0 - 6.0 % Final    Comment: HEMOGLOBIN A1C INTERPRETATION:    The following arbitrary ranges may be used for interpretation of the results.  However, factors such as duration of diabetes, adherence to therapy, and  patient age should also be considered in assessing degree of blood glucose  control.    Hemoglobin A1C                 Avg. Blood Sugar  --------------------------------------------------------------  6%                           135 mg/dL  7%                           170 mg/dL  8%                           205 mg/dL  9%  240 mg/dL  40%                          275 mg/dL    ======================================================    A1C                      Glucose Control  ----------------------------------------------------------------  < 6.0 %                   Normal  6.0 - 6.9 %               Abnormal  7.0 - 7.9 %               Sub-Optimal Control  > 8.0 %                   Inadequate Control      Estimated Avg Glucose 04/13/2023 111   Final    Estimated Avg Glucose 04/13/2023 118   Final    Cholesterol, Total 04/13/2023 130  100 - 200 mg/dL Final    Comment: The National Cholesterol Education Program has published reference  cholesterol values for cardiovascular risk to be:    Less than 200 mg/dL     = Low Risk    981 to 239 mg/dL        = Borderline Risk    240mg /dL and greater    = High Risk      HDL 04/13/2023 61  >=40 mg/dL Final    Comment: The National Lipid Association and the [employer] Cholesterol Education Program  (NCEP) have set the guidelines for high-density lipoprotein (HDL) cholesterol  in adults ages 73 and up.      Triglycerides 04/13/2023 75  0 - 149 mg/dL Final    Comment:   TRIGLYCERIDE INTERPRETATION:                          Recommended Fasting Triglyc Levels for Adults                        =============================================                         Desirable                        < 150 mg/dL                        Average                          < 200 mg/dL                        Borderline High             200 to 500 mg/dL                        Hypertriglyceridemic             > 500 mg/dL                        =============================================      LDL Cholesterol 04/13/2023 54.0  0.0 - 100.0 mg/dL Final    LDL/HDL Ratio  04/13/2023 0.9   Final    Chol/HDL Ratio 04/13/2023 2.1  0.0 - 4.4 Final    VLDL 04/13/2023 15.0  5.0 - 40.0 mg/dL Final    Sodium 57/84/6962 145  135 - 145 mmol/L Final    Potassium 04/13/2023 4.8  3.5 - 5.3 mmol/L Final    Chloride 04/13/2023 109 (H)  98 - 107 mmol/L Final    CO2 04/13/2023 27  22 - 29 mmol/L Final    Glucose 04/13/2023 111 (H)  70 - 99 mg/dL Final    BUN 95/28/4132 23  8 - 23 mg/dL Final    Creatinine 44/02/270 0.9  0.7 - 1.3 mg/dL Final    Anion Gap 53/66/4403 9  2 - 17 mmol/L Final    Osmolaliy Calculated 04/13/2023 293 (H)  270 - 287 mOsm/kg Final    Calcium 04/13/2023 9.4  8.5 - 10.7 mg/dL Final    Total Protein 04/13/2023 6.6  5.7 - 8.3 g/dL Final    Albumin 47/42/5956 4.2  3.5 - 5.2 g/dL Final    Globulin 38/75/6433 2.4  1.9 - 4.4 g/dL Final    Albumin/Globulin Ratio 04/13/2023 1.80  1.00 - 2.70 Final    Total Bilirubin 04/13/2023 0.44  0.00 - 1.20 mg/dL Final    Alk Phosphatase 04/13/2023 46  40 - 130 unit/L Final    AST 04/13/2023 35  0 - 46 unit/L Final    ALT 04/13/2023 49 (H)  0 - 42 unit/L Final    Est, Glom Filt Rate 04/13/2023 90  >=60 mL/min/1.39m Final    Comment: VERIFIED by Discern Expert.  GFR Interpretation:                                                                         % OF  KIDNEY  GFR                                                        STAGE  FUNCTION  ==================================================================================    > 90        Normal kidney function                       STAGE 1  90-100%  89 to 60      Mild loss of kidney  function                 STAGE 2  80-60%  59 to 45      Mild to moderate loss of kidney function     STAGE 3a  59-45%  44 to 30      Moderate to severe loss of kidney function   STAGE 3b  44-30%  29 to 15      Severe loss of kidney function               STAGE 4  29-15%    < 15        Kidney failure  STAGE 5  <15%  ==================================================================================  Modified from National Kidney Foundation    GFR Calculation performed using the CKD-EPI 2021 equation developed for use  with IDMS traceable creatinine methods and                            is the calculation recommended by  the [facility] for estimating GFR in adults.      WBC 04/13/2023 4.3  3.8 - 10.6 x10e3/mcL Final    RBC 04/13/2023 4.06  4.00 - 5.60 x10e6/mcL Final    Hemoglobin 04/13/2023 12.3 (L)  13.0 - 17.3 g/dL Final    Hematocrit 16/11/9602 38.1  38.0 - 52.0 % Final    MCV 04/13/2023 93.8  84.0 - 100.0 fL Final    MCH 04/13/2023 30.3  27.0 - 34.5 pg Final    MCHC 04/13/2023 32.3  30.0 - 36.0 g/dL Final    RDW 54/10/8117 13.2  10.0 - 17.0 % Final    Platelets 04/13/2023 156  140 - 440 x10e3/mcL Final    MPV 04/13/2023 11.3  7.0 - 12.2 fL Final    NRBC Automated 04/13/2023 0.0  0.0 - 0.2 % Final    NRBC Absolute 04/13/2023 0.000  0.000 - 0.012 x10e3/mcL Final    Neutrophils % 04/13/2023 64.9  42.0 - 74.0 % Final    Lymphocytes 04/13/2023 24.5  15.0 - 45.0 % Final    Monocytes % 04/13/2023 8.3  4.0 - 12.0 % Final    Eosinophils % 04/13/2023 1.4  0.0 - 7.0 % Final    Basophils % 04/13/2023 0.7  0.0 - 2.0 % Final    Neutrophils Absolute 04/13/2023 2.8  1.6 - 7.3 x10e3/mcL Final    Lymphocytes Absolute 04/13/2023 1.1  1.0 - 3.2 x10e3/mcL Final    Monocytes Absolute 04/13/2023 0.4  0.3 - 1.0 x10e3/mcL Final    Eosinophils Absolute 04/13/2023 0.1  0.0 - 0.5 x10e3/mcL Final    Basophils Absolute 04/13/2023 0.0  0.0 - 0.2 x10e3/mcL Final    Immature Granulocytes % 04/13/2023  0.2  0.0 - 0.6 % Final    Immature Grans (Abs) 04/13/2023 0.01  0.00 - 0.06 x10e3/mcL Final      No results found for this visit on 04/17/23.     The patient (or guardian, if applicable) and other individuals in attendance with the patient were advised that Artificial Intelligence will be utilized during this visit to record and process the conversation to generate a clinical note. The patient (or guardian, if applicable) and other individuals in attendance at the appointment consented to the use of AI, including the recording.                          An electronic signature was used to authenticate this note.    --Claybon Jabs, MD

## 2023-04-17 NOTE — Telephone Encounter (Signed)
 I messed up. Pls es again

## 2023-05-19 ENCOUNTER — Encounter

## 2023-05-19 LAB — HEPATIC FUNCTION PANEL
ALT: 33 U/L (ref 0–42)
AST: 32 U/L (ref 0–46)
Albumin: 4.2 g/dL (ref 3.5–5.2)
Alk Phosphatase: 46 U/L (ref 40–130)
Bilirubin, Direct: 0.28 mg/dL (ref 0.00–0.30)
Bilirubin, Indirect: 0.25 mg/dL (ref 0.00–1.00)
Total Bilirubin: 0.53 mg/dL (ref 0.00–1.20)
Total Protein: 6.5 g/dL (ref 5.7–8.3)

## 2023-05-19 LAB — VITAMIN B12: Vitamin B-12: 534 pg/mL (ref 232–1245)

## 2023-05-19 NOTE — Other (Signed)
 Please notify patient that their tests results are normal.

## 2023-06-05 ENCOUNTER — Encounter

## 2023-06-08 MED ORDER — GABAPENTIN 300 MG PO CAPS
300 | ORAL_CAPSULE | Freq: Two times a day (BID) | ORAL | 1 refills | 30.00000 days | Status: DC
Start: 2023-06-08 — End: 2023-09-18

## 2023-06-08 NOTE — Telephone Encounter (Signed)
 Pls es

## 2023-07-23 ENCOUNTER — Telehealth

## 2023-07-23 NOTE — Telephone Encounter (Signed)
 Labs ordered for upcoming visit

## 2023-07-30 ENCOUNTER — Encounter

## 2023-07-30 LAB — CBC WITH AUTO DIFFERENTIAL
Basophils %: 0.8 % (ref 0.0–2.0)
Basophils Absolute: 0 10*3/uL (ref 0.0–0.2)
Eosinophils %: 1.9 % (ref 0.0–7.0)
Eosinophils Absolute: 0.1 10*3/uL (ref 0.0–0.5)
Hematocrit: 37.1 % — ABNORMAL LOW (ref 38.0–52.0)
Hemoglobin: 11.9 g/dL — ABNORMAL LOW (ref 13.0–17.3)
Immature Grans (Abs): 0.01 10*3/uL (ref 0.00–0.06)
Immature Granulocytes %: 0.2 % (ref 0.0–0.6)
Lymphocytes Absolute: 1.2 10*3/uL (ref 1.0–3.2)
Lymphocytes: 24.2 % (ref 15.0–45.0)
MCH: 30.2 pg (ref 27.0–34.5)
MCHC: 32.1 g/dL (ref 30.0–36.0)
MCV: 94.2 fL (ref 84.0–100.0)
MPV: 11.4 fL (ref 7.0–12.2)
Monocytes %: 9.3 % (ref 4.0–12.0)
Monocytes Absolute: 0.5 10*3/uL (ref 0.3–1.0)
NRBC Absolute: 0 10*3/uL (ref 0.000–0.012)
NRBC Automated: 0 % (ref 0.0–0.2)
Neutrophils %: 63.6 % (ref 42.0–74.0)
Neutrophils Absolute: 3.1 10*3/uL (ref 1.6–7.3)
Platelets: 150 10*3/uL (ref 140–440)
RBC: 3.94 x10e6/mcL — ABNORMAL LOW (ref 4.00–5.60)
RDW: 12.7 % (ref 10.0–17.0)
WBC: 4.8 10*3/uL (ref 3.8–10.6)

## 2023-07-30 LAB — COMPREHENSIVE METABOLIC PANEL
ALT: 35 U/L (ref 0–42)
AST: 33 U/L (ref 0–46)
Albumin/Globulin Ratio: 1.8 (ref 1.00–2.70)
Albumin: 4.2 g/dL (ref 3.5–5.2)
Alk Phosphatase: 43 U/L (ref 40–130)
Anion Gap: 10 mmol/L (ref 2–17)
BUN: 19 mg/dL (ref 8–23)
CO2: 27 mmol/L (ref 22–29)
Calcium: 9.4 mg/dL (ref 8.5–10.7)
Chloride: 104 mmol/L (ref 98–107)
Creatinine: 0.9 mg/dL (ref 0.7–1.3)
Est, Glom Filt Rate: 90 mL/min/1.73mÂ² (ref >=60)
Globulin: 2.3 g/dL (ref 1.9–4.4)
Glucose: 99 mg/dL (ref 70–99)
Osmolaliy Calculated: 283 mosm/kg (ref 270–287)
Potassium: 4.1 mmol/L (ref 3.5–5.3)
Sodium: 141 mmol/L (ref 135–145)
Total Bilirubin: 0.46 mg/dL (ref 0.00–1.20)
Total Protein: 6.5 g/dL (ref 5.7–8.3)

## 2023-07-30 LAB — LIPID PANEL
Chol/HDL Ratio: 2.1 (ref 0.0–4.4)
Cholesterol, Total: 122 mg/dL (ref 100–200)
HDL: 58 mg/dL (ref >=40)
LDL Cholesterol: 52.4 mg/dL (ref 0.0–100.0)
LDL/HDL Ratio: 0.9
Triglycerides: 58 mg/dL (ref 0–149)
VLDL: 11.6 mg/dL (ref 5.0–40.0)

## 2023-07-30 LAB — URINALYSIS W/ RFLX MICROSCOPIC
Bilirubin, Urine: NEGATIVE
Blood, Urine: NEGATIVE
Glucose, Ur: NEGATIVE
Ketones, Urine: NEGATIVE
Leukocyte Esterase, Urine: NEGATIVE
Nitrite, Urine: NEGATIVE
Protein, UA: NEGATIVE
Specific Gravity, UA: 1.02 (ref 1.003–1.035)
Urobilinogen, Urine: 1 EU/dL (ref 0.2–1.0)
pH, Urine: 7 (ref 4.5–8.0)

## 2023-07-30 LAB — HEMOGLOBIN A1C
Estimated Avg Glucose: 114
Estimated Avg Glucose: 122
Hemoglobin A1C: 5.6 % (ref 4.0–6.0)

## 2023-07-30 LAB — PSA SCREENING: PSA, Screening: 1.23 ng/mL (ref 0.000–4.000)

## 2023-07-30 LAB — TSH REFLEX TO FT4: TSH: 2.53 u[IU]/mL (ref 0.358–3.740)

## 2023-07-30 NOTE — Other (Signed)
 We will review labs at upcoming appointment.Call us if you need to reschedule.

## 2023-08-03 ENCOUNTER — Encounter: Attending: Family Medicine | Primary: Family Medicine

## 2023-08-07 NOTE — Telephone Encounter (Signed)
APPT CHANGED.

## 2023-08-08 ENCOUNTER — Encounter

## 2023-08-10 MED ORDER — OZEMPIC (2 MG/DOSE) 8 MG/3ML SC SOPN
8 | SUBCUTANEOUS | 1 refills | 30.00000 days | Status: DC
Start: 2023-08-10 — End: 2023-09-18

## 2023-08-10 NOTE — Telephone Encounter (Signed)
 Pls es

## 2023-08-26 ENCOUNTER — Encounter

## 2023-08-27 ENCOUNTER — Encounter: Attending: Family Medicine | Primary: Family Medicine

## 2023-08-27 MED ORDER — OMEPRAZOLE 20 MG PO CPDR
20 | ORAL_CAPSULE | Freq: Every day | ORAL | 3 refills | 90.00000 days | Status: DC
Start: 2023-08-27 — End: 2023-09-18

## 2023-08-27 MED ORDER — LOSARTAN POTASSIUM 100 MG PO TABS
100 | ORAL_TABLET | Freq: Every day | ORAL | 3 refills | 90.00000 days | Status: DC
Start: 2023-08-27 — End: 2023-09-18

## 2023-08-27 NOTE — Telephone Encounter (Signed)
 Pls es

## 2023-08-31 MED ORDER — ROSUVASTATIN CALCIUM 40 MG PO TABS
40 | ORAL_TABLET | Freq: Every day | ORAL | 3 refills | 90.00000 days | Status: DC
Start: 2023-08-31 — End: 2023-09-18

## 2023-08-31 NOTE — Telephone Encounter (Signed)
 Pls es

## 2023-09-01 ENCOUNTER — Encounter: Payer: MEDICARE | Attending: Internal Medicine | Primary: Family Medicine

## 2023-09-09 ENCOUNTER — Ambulatory Visit: Admit: 2023-09-09 | Discharge: 2023-09-09 | Payer: MEDICARE | Attending: Medical | Primary: Family Medicine

## 2023-09-09 ENCOUNTER — Ambulatory Visit: Admit: 2023-09-09 | Discharge: 2023-09-09 | Payer: MEDICARE | Primary: Family Medicine

## 2023-09-09 VITALS — BP 142/66 | HR 65 | Temp 98.70000°F | Ht 75.0 in | Wt 259.8 lb

## 2023-09-09 VITALS — BP 146/72 | HR 71 | Resp 16 | Ht 75.0 in | Wt 259.9 lb

## 2023-09-09 DIAGNOSIS — M7061 Trochanteric bursitis, right hip: Principal | ICD-10-CM

## 2023-09-09 DIAGNOSIS — I2510 Atherosclerotic heart disease of native coronary artery without angina pectoris: Principal | ICD-10-CM

## 2023-09-09 MED ORDER — MELOXICAM 15 MG PO TABS
15 | ORAL_TABLET | Freq: Every day | ORAL | 0 refills | Status: DC
Start: 2023-09-09 — End: 2024-01-26

## 2023-09-09 NOTE — Progress Notes (Signed)
 Date:  September 09, 2023  Patient name: William Nila William Newton [name]  Date of Birth: 09/29/1950      REASON FOR CLINIC VISIT: No chief complaint on file.    Primary cardiologist: Claudene    HISTORY OF PRESENT ILLNESS        William [name] is a 73 y.o. male with history of coronary artery disease status post PCI x3, hypertension, hyperlipidemia presents to office today for follow up. Patient was last seen in office on 08/20/2022.     At today's visit, patient reports that he has been doing well from cardiac standpoint.  Patient denied chest pain, dyspnea, orthopnea/PND, LE edema, palpitations, dizziness/lightheadedness, presyncope/syncope. No other complaints at this time.    PAST HISTORY          Past Medical History:   has a past medical history of Eye pressure, GERD (gastroesophageal reflux disease), Heart attack (HCC), Hyperlipidemia, Hypertension, Lumbar back pain, OSA on CPAP, Prediabetes, and Type 2 diabetes mellitus with hyperglycemia, without long-term current use of insulin (HCC).    Past Surgical History:   has a past surgical history that includes Carotid stent (01/2018); back surgery (1991); Tonsillectomy; Knee cartilage surgery; Cardiac procedure (N/A, 08/21/2021); Cardiac procedure (N/A, 08/21/2021); Colonoscopy; Lumbar spine surgery (N/A, 04/16/2022); and Mohs surgery (Right, 09/18/2022).     Social History:   reports that he quit smoking about 41 years ago. His smoking use included cigarettes. He started smoking about 56 years ago. He has a 15 pack-year smoking history. He has never used smokeless tobacco. He reports current alcohol use of about 6.0 standard drinks of alcohol per week. He reports that he does not use drugs.     Family History: family history includes Alzheimer's Disease in his mother; Diabetes in his father; Heart Failure in his father and maternal grandmother; No Known Problems in his maternal grandfather, paternal grandfather, and paternal grandmother.    Allergies:  Patient has no known  allergies.    Home Medications:    Prior to Admission medications    Medication Sig Start Date End Date Taking? Authorizing Provider   diphenhydrAMINE -APAP, sleep, (TYLENOL  PM EXTRA STRENGTH) 25-500 MG tablet Take 1 tablet by mouth nightly as needed for Sleep   Yes [provider]   rosuvastatin  (CRESTOR ) 40 MG tablet TAKE 1 TABLET BY MOUTH DAILY 08/31/23  Yes Charleen, Sarette J, PA-C   losartan  (COZAAR ) 100 MG tablet TAKE 1 TABLET BY MOUTH DAILY 08/27/23  Yes Rowe, John R Jr., MD   omeprazole  (PRILOSEC) 20 MG delayed release capsule TAKE 1 CAPSULE BY MOUTH DAILY 08/27/23  Yes Rowe, John R Jr., MD   OZEMPIC , 2 MG/DOSE, 8 MG/3ML SOPN sc injection INJECT 2 MG INTO THE SKIN EVERY 7 DAYS 08/10/23  Yes Kelley, Sarette J, PA-C   gabapentin  (NEURONTIN ) 300 MG capsule Take 1 capsule by mouth 2 times daily for 180 days. 06/08/23 12/05/23 Yes Charleen, Sarette J, PA-C   carvedilol  (COREG ) 6.25 MG tablet TAKE 1 TABLET TWICE A DAY  AS DIRECTED 04/17/23  Yes Rowe, John R Jr., MD   nitroGLYCERIN  (NITROSTAT ) 0.4 MG SL tablet as directed Sublingual 1 tab under tongue q 5 minutes x 3 doses chest pain doe not help call 911 04/17/23  Yes Rowe, Norleen JONELLE Raddle., MD   latanoprost  (XALATAN ) 0.005 % ophthalmic solution INSTILL 1 DROP IN BOTH EYES AT BEDTIME 02/26/23  Yes Charleen, Sarette J, PA-C   Coenzyme Q10 (COQ-10) 200 MG CAPS Take 2 tablets by mouth nightly  Yes [provider]   Multiple Vitamins-Minerals (CENTRUM VITAMINTS PO) Take 1 tablet by mouth every other day   Yes [provider]   fluticasone  (FLONASE ) 50 MCG/ACT nasal spray 1 spray by Each Nostril route daily   Yes [provider]   aspirin  81 MG EC tablet Take 1 tablet by mouth daily   Yes [provider]   meloxicam  (MOBIC ) 15 MG tablet Take 1 tablet by mouth daily 09/09/23 10/09/23  Kelley, Sarette J, PA-C         REVIEW OF SYSTEMS      Review of Systems   Constitutional:  Negative for activity change, chills, fatigue and unexpected weight  change.   HENT:  Negative for congestion, hearing loss, nosebleeds, tinnitus and trouble swallowing.    Eyes:  Negative for pain and visual disturbance.   Respiratory:  Negative for cough, chest tightness, shortness of breath and wheezing.    Cardiovascular:  Negative for chest pain, palpitations and leg swelling.   Gastrointestinal:  Negative for abdominal pain, blood in stool, constipation, diarrhea, nausea and vomiting.   Endocrine: Negative for cold intolerance and heat intolerance.   Genitourinary:  Negative for dysuria and hematuria.   Musculoskeletal:  Negative for arthralgias, joint swelling and myalgias.   Skin:  Negative for rash and wound.   Neurological:  Negative for dizziness, syncope, weakness, light-headedness and headaches.          PHYSICAL EXAM            Vitals:    09/09/23 1223   BP: (!) 146/72   Pulse: 71   Resp: 16   SpO2: 99%   Weight: 117.9 kg (259 lb 14.4 oz)   Height: 1.905 m (6' 3)       CONSTITUTIONAL:  awake, alert, cooperative, no apparent distress  ENT:   supple, moist mucus membranes, no carotid bruit.  LUNGS:  clear to auscultation, non- labored  CARDIOVASCULAR:  normal rate, regular rhythm, no murmur/rub/gallop  ABDOMEN:  soft, non-tender, non-distended, BS present  EXTREMITIES:  no LE edema  NEUROLOGIC:  alert & oriented x3, no focal deficits    LABS        Lab Results   Component Value Date    WBC 4.8 07/30/2023    HGB 11.9 (L) 07/30/2023    HCT 37.1 (L) 07/30/2023    MCV 94.2 07/30/2023    PLT 150 07/30/2023     Lab Results   Component Value Date/Time    NA 141 07/30/2023 07:10 AM    K 4.1 07/30/2023 07:10 AM    CL 104 07/30/2023 07:10 AM    CO2 27 07/30/2023 07:10 AM    BUN 19 07/30/2023 07:10 AM    CREATININE 0.9 07/30/2023 07:10 AM    GLUCOSE 99 07/30/2023 07:10 AM     Lab Results   Component Value Date/Time    NA 141 07/30/2023 07:10 AM    K 4.1 07/30/2023 07:10 AM    CL 104 07/30/2023 07:10 AM    CO2 27 07/30/2023 07:10 AM    BUN 19 07/30/2023 07:10 AM    CREATININE 0.9  07/30/2023 07:10 AM    GLUCOSE 99 07/30/2023 07:10 AM    CALCIUM  9.4 07/30/2023 07:10 AM    LABGLOM 90 07/30/2023 07:10 AM    LABGLOM 93 12/31/2021 08:01 AM      Lab Results   Component Value Date    NA 141 07/30/2023    K 4.1 07/30/2023  CL 104 07/30/2023    CO2 27 07/30/2023    BUN 19 07/30/2023    CREATININE 0.9 07/30/2023    GLUCOSE 99 07/30/2023    CALCIUM  9.4 07/30/2023    BILITOT 0.46 07/30/2023    ALKPHOS 43 07/30/2023    AST 33 07/30/2023    ALT 35 07/30/2023    LABGLOM 90 07/30/2023    GFRAA 99 05/31/2020    GLOB 2.3 07/30/2023     Lab Results   Component Value Date    CHOL 122 07/30/2023    CHOL 130 04/13/2023    CHOL 130 10/09/2022     Lab Results   Component Value Date    TRIG 58 07/30/2023    TRIG 75 04/13/2023    TRIG 87 10/09/2022     Lab Results   Component Value Date    HDL 58 07/30/2023    HDL 61 04/13/2023    HDL 55 10/09/2022     No components found for: LDLCHOLESTEROL, LDLCALC, LASTLDL  Lab Results   Component Value Date    VLDL 11.6 07/30/2023    VLDL 15.0 04/13/2023    VLDL 17.4 10/09/2022     Lab Results   Component Value Date    CHOLHDLRATIO 2.1 07/30/2023    CHOLHDLRATIO 2.1 04/13/2023    CHOLHDLRATIO 2.4 10/09/2022     No results found for: NTPROBNP    DIAGNOSTIC STUDIES           No results found for this or any previous visit (from the past 4464 hours).    No results found for any visits on 09/09/23.     07/28/22    ECHO (TTE) COMPLETE (PRN CONTRAST/BUBBLE/STRAIN/3D) 08/04/2022 11:02 PM (Final)    Interpretation Summary    Left Ventricle: Low normal left ventricular systolic function with a visually estimated EF of 55 - 60%. Left ventricle size is normal. Mildly increased wall thickness. Normal wall motion. Global longitudinal strain is normal. Abnormal diastolic function.    Left Atrium: Left atrium is mildly dilated.    Image quality is good.    Normal LV systolic function  No significant valvular stenosis or regurgitation  Grade 1 diastolic dysfunction    Signed by: Lynwood Lenon Sharps, MD on 08/04/2022 11:02 PM      No results found for this or any previous visit.       08/21/21    CARDIAC PROCEDURE 08/22/2021  8:42 AM (Final)    Conclusion  Obstructive 2 vessel  Recent episode of chest pain at home  First pictures had less than TIMI 1 flow however this improved with repeat injections    RCA has minimal disease  Patent LCX stent  LAD and diagonal have high grade >90% stenoses that are heavily calcified on IVUS    Successful PCI with DES after non-compliant, cutting and shockwave IVL angioplasty    Normal LVEDP      Recommend DAPT x 1 year    Signed by: Lynwood Lenon Sharps, MD on 08/22/2021  8:42 AM         ASSESSMENT / RECOMMENDATIONS        1. Atherosclerosis of native coronary artery of native heart without angina pectoris  No anginal symptoms, on ASA and statin therapy.     2. Essential hypertension  Blood pressure minimally elevated at 146/72.  Currently treated with losartan  100 mg and carvedilol  6.25 mg twice daily.  Patient reports is normally well-controlled at home, feels it is elevated secondary to  some knee pain and traffic this morning.  He will continue to monitor at home and let us  know if it is consistently elevated above 140/90.  Consider increasing carvedilol  or adding a low-dose amlodipine.    3. Mixed hyperlipidemia  On rosuvastatin  40 mg.  Last lipid panel 07/30/2023 with an LDL of 52.  Continue current regimen     FOLLOW-UP:  Return in about 3 months (around 12/10/2023).      Electronically signed by Glennon Mortimer, [name] / [facility]

## 2023-09-09 NOTE — Progress Notes (Signed)
 OFFICE VISIT    Name: William [name] Date: 09/09/2023   MRN: [id number] Sex: Male   Age: 73 y.o. Ethnicity: Non-Hispanic / Non Latino   DOB: [birth year] Race: White (non-Hispanic)      Chief Complaint   Patient presents with    Follow-up     Pain in rt leg radiates all the way down     HISTORY OF PRESENT ILLNESS  History of Present Illness  The patient presents for right leg pain.    He has been experiencing right leg pain since late April 2025, which began during a cruise trip. Despite his regular exercise routine at [employer], including 45-minute treadmill sessions and barbell workouts, he was unable to walk due to the pain. The pain has been intermittent but persistent for several weeks. It originates from the lower part of his RIGHT buttocks and occasionally radiates down his leg, particularly when driving. He reports no back pain. The pain, described as similar to a toothache, sometimes extends to his ankle. His sleep is disrupted due to the pain, which forces him to change positions frequently. He recalls a fall at home but does not believe it is related to his current condition. He reports no numbness or tingling in his right leg. He experienced foot swelling during a beach visit, which he attributes to prolonged sitting with his feet down. He has not noticed any swelling since returning home. He has gained weight due to his inability to exercise. He has been managing the pain with Arthritis Strength Tylenol , as advised by his cardiologist, Dr. Claudene, who prohibited the use of Advil or ibuprofen following his heart attack. The Tylenol  provides minimal relief. He reports no muscle spasms, tightness, or tension. He is not currently on any blood thinners. The pain is exacerbated by walking, standing, and lying on his right side.     No Known Allergies    Prior to Visit Medications    Medication Sig Taking? Authorizing Provider   diphenhydrAMINE -APAP, sleep, (TYLENOL  PM EXTRA STRENGTH) 25-500 MG tablet  Take 1 tablet by mouth nightly as needed for Sleep Yes [provider]   rosuvastatin  (CRESTOR ) 40 MG tablet TAKE 1 TABLET BY MOUTH DAILY Yes Charleen, Sumayah Bearse J, PA-C   losartan  (COZAAR ) 100 MG tablet TAKE 1 TABLET BY MOUTH DAILY Yes Rowe, Norleen JONELLE Raddle., MD   omeprazole  (PRILOSEC) 20 MG delayed release capsule TAKE 1 CAPSULE BY MOUTH DAILY Yes Rowe, John R Jr., MD   OZEMPIC , 2 MG/DOSE, 8 MG/3ML SOPN sc injection INJECT 2 MG INTO THE SKIN EVERY 7 DAYS Yes Tejah Brekke J, PA-C   gabapentin  (NEURONTIN ) 300 MG capsule Take 1 capsule by mouth 2 times daily for 180 days. Yes Charleen, Shavonte Zhao J, PA-C   carvedilol  (COREG ) 6.25 MG tablet TAKE 1 TABLET TWICE A DAY  AS DIRECTED Yes Rowe, John R Jr., MD   nitroGLYCERIN  (NITROSTAT ) 0.4 MG SL tablet as directed Sublingual 1 tab under tongue q 5 minutes x 3 doses chest pain doe not help call 911 Yes Rowe, Norleen JONELLE Raddle., MD   latanoprost  (XALATAN ) 0.005 % ophthalmic solution INSTILL 1 DROP IN BOTH EYES AT BEDTIME Yes Charleen, Marg Macmaster J, PA-C   Coenzyme Q10 (COQ-10) 200 MG CAPS Take 2 tablets by mouth nightly Yes [provider]   Multiple Vitamins-Minerals (CENTRUM VITAMINTS PO) Take 1 tablet by mouth every other day Yes [provider]   fluticasone  (FLONASE ) 50 MCG/ACT nasal spray 1 spray by Each Nostril route daily  Yes [provider]   aspirin  81 MG EC tablet Take 1 tablet by mouth daily Yes [provider]       HISTORY    Past Medical History:   Diagnosis Date    Eye pressure     GERD (gastroesophageal reflux disease)     Heart attack (HCC) 05/2021    Hyperlipidemia     Hypertension     Lumbar back pain     OSA on CPAP     Prediabetes     Type 2 diabetes mellitus with hyperglycemia, without long-term current use of insulin (HCC) 04/08/2022     Past Surgical History:   Procedure Laterality Date    BACK SURGERY  1991    CARDIAC PROCEDURE N/A 08/21/2021    Left heart cath performed by Lynwood Lenon Sharps, MD at [facility]     CARDIAC PROCEDURE N/A 08/21/2021    Insert stent des coronary performed by Lynwood Lenon Sharps, MD at [facility]    CAROTID STENT  01/2018    COLONOSCOPY      KNEE CARTILAGE SURGERY      LUMBAR SPINE SURGERY N/A 04/16/2022    BILATERAL L3/4 LUMBAR DECOMPRESSION performed by Polo Norleen BIRCH, MD at [facility] MAIN OR    MOHS SURGERY Right 09/18/2022    [facility] rt side of face.    TONSILLECTOMY       Family History   Problem Relation Age of Onset    Alzheimer's Disease Mother         TIA    Heart Failure Father         DM    Diabetes Father         Died in September 25, 1994    Heart Failure Maternal Grandmother     No Known Problems Maternal Grandfather     No Known Problems Paternal Grandmother     No Known Problems Paternal Grandfather      Social History     Tobacco Use    Smoking status: Former     Current packs/day: 0.00     Average packs/day: 0.6 packs/day for 26.6 years (15.0 ttl pk-yrs)     Types: Cigarettes     Start date: 10/07/1966     Quit date: September 25, 1982     Years since quitting: 41.5    Smokeless tobacco: Never   Vaping Use    Vaping status: Never Used   Substance Use Topics    Alcohol use: Yes     Alcohol/week: 6.0 standard drinks of alcohol     Types: 6 Drinks containing 0.5 oz of alcohol per week    Drug use: Never       REVIEW OF SYSTEMS  Other than what was mentioned in the history of present illness, a thorough 11 system review of system was done and was negative.    EXAMINATION  BP (!) 142/66   Pulse 65   Temp 98.7 F (37.1 C) (Oral)   Ht 1.905 m (6' 3)   Wt 117.8 kg (259 lb 12.8 oz)   SpO2 97%   BMI 32.47 kg/m   Wt Readings from Last 3 Encounters:   09/09/23 117.9 kg (259 lb 14.4 oz)   09/09/23 117.8 kg (259 lb 12.8 oz)   04/17/23 116.4 kg (256 lb 9.6 oz)       GENERAL APPEARANCE: normal, pleasant, in no acute distress.   HEAD: normocephalic, atraumatic.   HEART: regular rate and  rhythm, , no murmurs, rubs, gallops.   LUNGS: clear to auscultation bilaterally.   MUSCULOSKELETAL: full  range of motion RIGHT hip and knee, but pain with external rotation and adduction of RIGHT hip; RIGHT posterior hip tender to palpation  NEUROLOGIC: nonfocal, alert and oriented, cognitive exam grossly normal    ASSESSMENTS/PLAN  1. Trochanteric bursitis of right hip  Acute, uncomplicated.    - meloxicam  (MOBIC ) 15 MG tablet; Take 1 tablet by mouth daily  Dispense: 30 tablet; Refill: 0  Assessment & Plan  1. Right hip bursitis.  - Symptoms suggest a possible case of right hip bursitis, characterized by inflammation of the bursa. The absence of back pain and the nature of the pain, which is more akin to a toothache than the sharp or burning sensation typical of sciatica, support this diagnosis.  - Physical examination revealed pain upon rotation and movement of the right leg, particularly in the hip area.  - The patient is advised to consult his cardiologist regarding the use of meloxicam . Kidney function is good, and no contraindications for anti-inflammatory use are noted.  - A prescription for meloxicam  15 mg once daily for 30 days has been provided, with instructions to take it for 10 to 14 days even if symptoms improve. Concurrent use of Advil, ibuprofen, Motrin, or Aleve is discouraged while on meloxicam , but Tylenol  can be taken concurrently. If there is no improvement, a short course of oral steroids like prednisone may be considered, followed by physical therapy if necessary.    I confirm that I have managed the broad scope of the patient's health needs by furnishing care for some or all the patient's acute and/or chronic conditions across a spectrum of diagnoses and organ systems that will require ongoing care with myself or someone on my team.     Current Outpatient Medications   Medication Sig Dispense Refill    diphenhydrAMINE -APAP, sleep, (TYLENOL  PM EXTRA STRENGTH) 25-500 MG tablet Take 1 tablet by mouth nightly as needed for Sleep      meloxicam  (MOBIC ) 15 MG tablet Take 1 tablet by mouth daily 30  tablet 0    rosuvastatin  (CRESTOR ) 40 MG tablet TAKE 1 TABLET BY MOUTH DAILY 90 tablet 3    losartan  (COZAAR ) 100 MG tablet TAKE 1 TABLET BY MOUTH DAILY 90 tablet 3    omeprazole  (PRILOSEC) 20 MG delayed release capsule TAKE 1 CAPSULE BY MOUTH DAILY 90 capsule 3    OZEMPIC , 2 MG/DOSE, 8 MG/3ML SOPN sc injection INJECT 2 MG INTO THE SKIN EVERY 7 DAYS 9 mL 1    gabapentin  (NEURONTIN ) 300 MG capsule Take 1 capsule by mouth 2 times daily for 180 days. 180 capsule 1    carvedilol  (COREG ) 6.25 MG tablet TAKE 1 TABLET TWICE A DAY  AS DIRECTED 180 tablet 1    nitroGLYCERIN  (NITROSTAT ) 0.4 MG SL tablet as directed Sublingual 1 tab under tongue q 5 minutes x 3 doses chest pain doe not help call 911 25 tablet 5    latanoprost  (XALATAN ) 0.005 % ophthalmic solution INSTILL 1 DROP IN BOTH EYES AT BEDTIME 12.5 mL 11    Coenzyme Q10 (COQ-10) 200 MG CAPS Take 2 tablets by mouth nightly      Multiple Vitamins-Minerals (CENTRUM VITAMINTS PO) Take 1 tablet by mouth every other day      fluticasone  (FLONASE ) 50 MCG/ACT nasal spray 1 spray by Each Nostril route daily      aspirin  81 MG EC tablet Take 1 tablet by mouth  daily       No current facility-administered medications for this visit.          No follow-ups on file.      Samson Lawrence, PA-C     The patient (or guardian, if applicable) and other individuals in attendance with the patient were advised that Artificial Intelligence will be utilized during this visit to record, process the conversation to generate a clinical note, and support improvement of the AI technology. The patient (or guardian, if applicable) and other individuals in attendance at the appointment consented to the use of AI, including the recording.

## 2023-09-18 ENCOUNTER — Ambulatory Visit: Admit: 2023-09-18 | Discharge: 2023-09-18 | Payer: MEDICARE | Attending: Family Medicine | Primary: Family Medicine

## 2023-09-18 VITALS — BP 136/64 | HR 66 | Temp 98.00000°F | Resp 17 | Ht 76.0 in | Wt 267.6 lb

## 2023-09-18 DIAGNOSIS — E782 Mixed hyperlipidemia: Principal | ICD-10-CM

## 2023-09-18 MED ORDER — LOSARTAN POTASSIUM 100 MG PO TABS
100 | ORAL_TABLET | Freq: Every day | ORAL | 1 refills | 90.00000 days | Status: DC
Start: 2023-09-18 — End: 2024-01-26

## 2023-09-18 MED ORDER — OMEPRAZOLE 20 MG PO CPDR
20 | ORAL_CAPSULE | Freq: Every day | ORAL | 1 refills | 90.00000 days | Status: DC
Start: 2023-09-18 — End: 2024-01-26

## 2023-09-18 MED ORDER — OZEMPIC (2 MG/DOSE) 8 MG/3ML SC SOPN
8 | SUBCUTANEOUS | 1 refills | 56.00000 days | Status: DC
Start: 2023-09-18 — End: 2024-01-26

## 2023-09-18 MED ORDER — GABAPENTIN 300 MG PO CAPS
300 | ORAL_CAPSULE | Freq: Two times a day (BID) | ORAL | 1 refills | 30.00000 days | Status: DC
Start: 2023-09-18 — End: 2024-01-26

## 2023-09-18 MED ORDER — CITALOPRAM HYDROBROMIDE 10 MG PO TABS
10 | ORAL_TABLET | Freq: Every day | ORAL | 0 refills | 90.00000 days | Status: AC
Start: 2023-09-18 — End: ?

## 2023-09-18 MED ORDER — SILDENAFIL CITRATE 100 MG PO TABS
100 | ORAL_TABLET | Freq: Every day | ORAL | 1 refills | 30.00000 days | Status: AC | PRN
Start: 2023-09-18 — End: ?

## 2023-09-18 MED ORDER — ROSUVASTATIN CALCIUM 40 MG PO TABS
40 | ORAL_TABLET | Freq: Every day | ORAL | 1 refills | 90.00000 days | Status: DC
Start: 2023-09-18 — End: 2024-01-26

## 2023-09-18 MED ORDER — CARVEDILOL 6.25 MG PO TABS
6.25 | ORAL_TABLET | ORAL | 1 refills | 30.00000 days | Status: DC
Start: 2023-09-18 — End: 2024-01-26

## 2023-09-18 NOTE — Progress Notes (Signed)
 Medicare Annual Wellness Visit    William [name] is here for Medicare AWV (Pt states his leg is feeling a little better )    Assessment & Plan  1. Right leg pain.  - The right leg pain could be attributed to either a tendon or bursa issue, with some tension contributing to the discomfort.  - Physical therapy was recommended for stretching exercises to alleviate the strain on the affected area.  - He was advised to perform these exercises a few times daily.  - Walking was also suggested as a beneficial activity, with caution against overheating.    2. Sleep disturbance.  - He was advised to increase his gabapentin  dosage to 2 tablets at 9:30 p.m., while maintaining the morning dose.  - The potential benefits of magnesium  were discussed, and he was informed that it could be taken safely given his healthy kidneys.  - He was also advised to avoid daytime naps and evening alcohol consumption to improve sleep quality.  - Melatonin was mentioned as an option, but he was advised to try one intervention at a time.    3. Anxiety.  - A prescription for Celexa  10 mg was provided to manage his anxiety.  - He was informed that it might take 4 to 8 weeks to assess the effectiveness of this medication.  - If anxiety persists, the dosage may be increased to 20 mg.  - The potential impact of anxiety on sleep disturbances was discussed.    4. Erectile dysfunction.  - He was informed that he could take Viagra , but not in conjunction with nitroglycerin .  - A prescription for Viagra  100 mg was provided, with instructions to start with half or even a quarter of the tablet to monitor for side effects.  - He was advised that it is safe to take Viagra  daily until the optimal dosage is determined based on side effects and desired outcomes.  - Potential side effects such as congestion, flushing, and mild headaches were discussed.    Follow-up: A follow-up visit is scheduled in 4 weeks.    Mixed hyperlipidemia  -     rosuvastatin  (CRESTOR )  40 MG tablet; Take 1 tablet by mouth daily, Disp-90 tablet, R-1Please send a replace/new response with 90-Day Supply if appropriate to maximize member benefit. Requesting 1 year supply.Normal  Essential hypertension  -     carvedilol  (COREG ) 6.25 MG tablet; TAKE 1 TABLET TWICE A DAY  AS DIRECTED, Disp-180 tablet, R-1Normal  -     losartan  (COZAAR ) 100 MG tablet; Take 1 tablet by mouth daily, Disp-90 tablet, R-1Please send a replace/new response with 90-Day Supply if appropriate to maximize member benefit. Requesting 1 year supply.Normal  Acute bilateral low back pain with bilateral sciatica  -     gabapentin  (NEURONTIN ) 300 MG capsule; Take 1 capsule by mouth 2 times daily for 180 days., Disp-180 capsule, R-1Normal  Gastroesophageal reflux disease without esophagitis  -     omeprazole  (PRILOSEC) 20 MG delayed release capsule; Take 1 capsule by mouth daily, Disp-90 capsule, R-1Please send a replace/new response with 90-Day Supply if appropriate to maximize member benefit. Requesting 1 year supply.Normal  Type 2 diabetes mellitus without complication, without long-term current use of insulin (HCC)  -     semaglutide , 2 MG/DOSE, (OZEMPIC , 2 MG/DOSE,) 8 MG/3ML SOPN sc injection; Inject 2 mg into the skin every 7 days, Disp-9 mL, R-1Normal  Medicare annual wellness visit, subsequent  Anxiety  -     citalopram  (CELEXA )  10 MG tablet; Take 1 tablet by mouth daily, Disp-30 tablet, R-0Normal  Erectile dysfunction due to arterial insufficiency  -     sildenafil  (VIAGRA ) 100 MG tablet; Take 1 tablet by mouth daily as needed for Erectile Dysfunction, Disp-30 tablet, R-1Normal  Primary insomnia    Results  Labs   - A1c: Normal at 5.6   - Liver function test: Normal   - Cholesterol test: Normal   - Kidney function test: Normal   - Complete Blood Count (CBC): Normal   - Prostate cancer test: Normal   - Urine testing: Normal    Diagnostic Testing   - Dementia testing: No objective evidence of declines, overall intellectual abilities  strongly within normal ranges       No follow-ups on file.     Subjective     History of Present Illness  The patient presents for right leg pain, sleep disturbance, anxiety, erectile dysfunction, and health maintenance.    He has been experiencing discomfort in his right leg for several months, which was initially diagnosed as bursitis by Lauraine last week. Despite the improvement, he still struggles with tasks such as washing the bottom of his right foot or putting on a sock. He has been taking an anti-inflammatory medication since the previous Thursday, which has provided some relief but not complete resolution. He had been engaging in regular exercise, including walking 2 to 3 times a week for about 45 minutes each session. However, during a cruise in 05/2023, he experienced increased leg pain, which has persisted since then. 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 He was scheduled to come here on 08/03/2023, but he got COVID-19 about 3 days before the appointment, so he rescheduled for today.    He has been struggling with sleep issues, often waking up around 3:00 or 4:00 in the morning and finding it difficult to fall back asleep. He has been using Tylenol  PM intermittently, taking 2 tablets approximately 3 times a week. He also takes gabapentin  twice daily, once in the morning and once between 6:00 and 9:00 at night. He typically goes to bed between 9:00 and 9:30 p.m. and turns off the lights at 10:00 p.m. He has previously tried melatonin, which seemed to help, and is considering trying magnesium .    He underwent testing for dementia, which yielded positive results. He is not currently on any medication for anxiety, having discontinued a previous prescription due to perceived lack of efficacy. He is interested in exploring other treatment options.    He is considering the use of Viagra  and is curious about its potential impact on his heart. He had an EKG done at his cardiologist's office  last Wednesday and no changes were made to his treatment plan.    Sleep: He typically goes to bed between 9:00 and 9:30 p.m. and turns off the lights at 10:00 p.m. He often wakes up around 3:00 or 4:00 in the morning and finds it difficult to fall back asleep.      Patient's complete Health Risk Assessment and screening values have been reviewed and are found in Flowsheets. The following problems were reviewed today and where indicated follow up appointments were made and/or referrals ordered.    Positive Risk Factor Screenings with Interventions:    Fall Risk:  Do you feel unsteady or are you worried about falling? : no  2 or more falls in past year?: (!) yes  Fall with injury in past year?:  no  Interventions:    Reviewed medications, home hazards, visual acuity, and co-morbidities that can increase risk for falls            General HRA Questions:  Select all that apply: (!) New or Increased Pain  Interventions - Pain:  See A/P for plan and any pertinent orders      Inactivity:  On average, how many days per week do you engage in moderate to strenuous exercise (like a brisk walk)?: 0 days (!) Abnormal  On average, how many minutes do you engage in exercise at this level?: 0 min  Interventions:  Recommendations: patient agrees to exercise for at least 150 minutes/week     Abnormal BMI (obese):  Body mass index is 32.57 kg/m. (!) Abnormal  Interventions:  See A/P for plan and any pertinent orders         Hearing Screen:  Do you or your family notice any trouble with your hearing that hasn't been managed with hearing aids?: (!) Yes    Interventions:  Patient declines any further evaluation or treatment                      Objective   Vitals:    09/18/23 1352   BP: 136/64   Pulse: 66   Resp: 17   Temp: 98 F (36.7 C)   TempSrc: Oral   SpO2: 97%   Weight: 121.4 kg (267 lb 9.6 oz)   Height: 1.93 m (6' 4)      Body mass index is 32.57 kg/m.        Physical Exam  Neck: Lymph nodes are normal.  Respiratory: Clear to  auscultation, no wheezing, rales or rhonchi.  Cardiovascular: Regular rate and rhythm, no murmurs, rubs, or gallops.  Musculoskeletal: Pain in the right leg, particularly when performing specific movements indicating possible bursitis or tendon involvement.            No Known Allergies  Prior to Visit Medications    Medication Sig Taking? Authorizing Provider   carvedilol  (COREG ) 6.25 MG tablet TAKE 1 TABLET TWICE A DAY  AS DIRECTED Yes Rowe, Norleen JONELLE Raddle., William   gabapentin  (NEURONTIN ) 300 MG capsule Take 1 capsule by mouth 2 times daily for 180 days. Yes Rowe, Norleen JONELLE Raddle., William   losartan  (COZAAR ) 100 MG tablet Take 1 tablet by mouth daily Yes Rowe, Norleen JONELLE Raddle., William   omeprazole  (PRILOSEC) 20 MG delayed release capsule Take 1 capsule by mouth daily Yes Rowe, Tedd Cottrill R Jr., William   rosuvastatin  (CRESTOR ) 40 MG tablet Take 1 tablet by mouth daily Yes Rowe, Norleen JONELLE Raddle., William   semaglutide , 2 MG/DOSE, (OZEMPIC , 2 MG/DOSE,) 8 MG/3ML SOPN sc injection Inject 2 mg into the skin every 7 days Yes Ethel, Norleen JONELLE Raddle., William   sildenafil  (VIAGRA ) 100 MG tablet Take 1 tablet by mouth daily as needed for Erectile Dysfunction Yes Rowe, Norleen JONELLE Raddle., William   citalopram  (CELEXA ) 10 MG tablet Take 1 tablet by mouth daily Yes Rowe, Devory Mckinzie R Jr., William   diphenhydrAMINE -APAP, sleep, (TYLENOL  PM EXTRA STRENGTH) 25-500 MG tablet Take 1 tablet by mouth nightly as needed for Sleep Yes Provider, Historical, William   meloxicam  (MOBIC ) 15 MG tablet Take 1 tablet by mouth daily Yes Kelley, Sarette J, PA-C   nitroGLYCERIN  (NITROSTAT ) 0.4 MG SL tablet as directed Sublingual 1 tab under tongue q 5 minutes x 3 doses chest pain doe not help call 911 Yes  Ethel Norleen JONELLE Mickey., William   latanoprost  (XALATAN ) 0.005 % ophthalmic solution INSTILL 1 DROP IN BOTH EYES AT BEDTIME Yes Kelley, Sarette J, PA-C   Coenzyme Q10 (COQ-10) 200 MG CAPS Take 2 tablets by mouth nightly Yes Provider, Historical, William   Multiple Vitamins-Minerals (CENTRUM VITAMINTS PO) Take 1 tablet by mouth every other day Yes  Provider, Historical, William   fluticasone  (FLONASE ) 50 MCG/ACT nasal spray 1 spray by Each Nostril route daily Yes Provider, Historical, William   aspirin  81 MG EC tablet Take 1 tablet by mouth daily Yes Provider, Historical, William       CareTeam (Including outside providers/suppliers regularly involved in providing care):   Patient Care Team:  Ethel Norleen JONELLE Mickey., William as PCP - General (Family Medicine)  Ethel, Norleen JONELLE Mickey., William as PCP - Empaneled Provider  Claudene, Lynwood Piety, William as Consulting Physician (Cardiology)  Andrew Large, PA-C as Physician Assistant (Cardiology)     Recommendations for Preventive Services Due: see orders and patient instructions/AVS.  Recommended screening schedule for the next 5-10 years is provided to the patient in written form: see Patient Instructions/AVS.     Reviewed and updated this visit:  Tobacco  Allergies  Meds  Problems  Med Hx  Surg Hx  Fam Hx  Sexual   Hx              The patient (or guardian, if applicable) and other individuals in attendance with the patient were advised that Artificial Intelligence will be utilized during this visit to record and process the conversation to generate a clinical note. The patient (or guardian, if applicable) and other individuals in attendance at the appointment consented to the use of AI, including the recording.

## 2023-09-18 NOTE — Patient Instructions (Signed)
 Preventing Falls: Care Instructions  Injuries and health problems such as trouble walking or poor eyesight can increase your risk of falling. So can some medicines. But there are things you can do to help prevent falls. You can exercise to get stronger. You can also arrange your home to make it safer.    Talk to your doctor about the medicines you take. Ask if any of them increase the risk of falls and whether they can be changed or stopped.   Try to exercise regularly. It can help improve your strength and balance. This can help lower your risk of falling.         Practice fall safety and prevention.   Wear low-heeled shoes that fit well and give your feet good support. Talk to your doctor if you have foot problems that make this hard.  Carry a cellphone or wear a medical alert device that you can use to call for help.  Use stepladders instead of chairs to reach high objects. Don't climb if you're at risk for falls. Ask for help, if needed.  Wear the correct eyeglasses, if you need them.        Make your home safer.   Remove rugs, cords, clutter, and furniture from walkways.  Keep your house well lit. Use night-lights in hallways and bathrooms.  Install and use sturdy handrails on stairways.  Wear nonskid footwear, even inside. Don't walk barefoot or in socks without shoes.        Be safe outside.   Use handrails, curb cuts, and ramps whenever possible.  Keep your hands free by using a shoulder bag or backpack.  Try to walk in well-lit areas. Watch out for uneven ground, changes in pavement, and debris.  Be careful in the winter. Walk on the grass or gravel when sidewalks are slippery. Use de-icer on steps and walkways. Add non-slip devices to shoes.    Put grab bars and nonskid mats in your shower or tub and near the toilet. Try to use a shower chair or bath bench when bathing.   Get into a tub or shower by putting in your weaker leg first. Get out with your strong side first. Have a phone or medical alert  device in the bathroom with you.   Where can you learn more?  Go to [employer] and enter G117 to learn more about Preventing Falls: Care Instructions.  Current as of: September 17, 2022  Content Version: 14.5   [address] [place].   Care instructions adapted under license by [facility]. If you have questions about a medical condition or this instruction, always ask your healthcare professional. Romayne Alderman, [facility], disclaims any warranty or liability for your use of this information.         Learning About Being Active as an Older Adult  Why is being active important as you get older?     Being active is one of the best things you can do for your health. And it's never too late to start. Being active--or getting active, if you aren't already--has definite benefits. It can:  Give you more energy,  Keep your mind sharp.  Improve balance to reduce your risk of falls.  Help you manage chronic illness with fewer medicines.  No matter how old you are, how fit you are, or what health problems you have, there is a form of activity that will work for you. And the more physical activity you can do, the better your overall  health will be.  What kinds of activity can help you stay healthy?  Being more active will make your daily activities easier. Physical activity includes planned exercise and things you do in daily life. There are four types of activity:  Aerobic.  Doing aerobic activity makes your heart and lungs strong.  Includes walking, dancing, and gardening.  Aim for at least 2 hours spread throughout the week.  It improves your energy and can help you sleep better.  Muscle-strengthening.  This type of activity can help maintain muscle and strengthen bones.  Includes climbing stairs, using resistance bands, and lifting or carrying heavy loads.  Aim for at least twice a week.  It can help protect the knees and other joints.  Stretching.  Stretching gives you better range of  motion in joints and muscles.  Includes upper arm stretches, calf stretches, and gentle yoga.  Aim for at least twice a week, preferably after your muscles are warmed up from other activities.  It can help you function better in daily life.  Balancing.  This helps you stay coordinated and have good posture.  Includes heel-to-toe walking, tai chi, and certain types of yoga.  Aim for at least 3 days a week.  It can reduce your risk of falling.  Even if you have a hard time meeting the recommendations, it's better to be more active than less active. All activity done in each category counts toward your weekly total. You'd be surprised how daily things like carrying groceries, keeping up with grandchildren, and taking the stairs can add up.  What keeps you from being active?  If you've had a hard time being more active, you're not alone. Maybe you remember being able to do more. Or maybe you've never thought of yourself as being active. It's frustrating when you can't do the things you want. Being more active can help. What's holding you back?  Getting started.  Have a goal, but break it into easy tasks. Small steps build into big accomplishments.  Staying motivated.  If you feel like skipping your activity, remember your goal. Maybe you want to move better and stay independent. Every activity gets you one step closer.  Not feeling your best.  Start with 5 minutes of an activity you enjoy. Prove to yourself you can do it. As you get comfortable, increase your time.  You may not be where you want to be. But you're in the process of getting there. Everyone starts somewhere.  How can you find safe ways to stay active?  Talk with your doctor about any physical challenges you're facing. Make a plan with your doctor if you have a health problem or aren't sure how to get started with activity.  If you're already active, ask your doctor if there is anything you should change to stay safe as your body and health change.  If you  tend to feel dizzy after you take medicine, avoid activity at that time. Try being active before you take your medicine. This will reduce your risk of falls.  If you plan to be active at home, make sure to clear your space before you get started. Remove things like TV cords, coffee tables, and throw rugs. It's safest to have plenty of space to move freely.  The key to getting more active is to take it slow and steady. Try to improve only a little bit at a time. Pick just one area to improve on at first. And if an activity  hurts, stop and talk to your doctor.  Where can you learn more?  Go to [employer] and enter P600 to learn more about Learning About Being Active as an Older Adult.  Current as of: September 17, 2022  Content Version: 14.5   [address] [place].   Care instructions adapted under license by [facility]. If you have questions about a medical condition or this instruction, always ask your healthcare professional. Romayne Alderman, [facility], disclaims any warranty or liability for your use of this information.         Hearing Loss: Care Instructions  Overview     Hearing loss is a sudden or slow decrease in how well you hear. It can range from slight to profound. Permanent hearing loss can occur with aging. It also can happen when you are exposed long-term to loud noise. Examples include listening to loud music, riding motorcycles, or being around other loud machines.  Hearing loss can affect your work and home life. It can make you feel lonely or depressed. You may feel that you have lost your independence. But hearing aids and other devices can help you hear better and feel connected to others.  Follow-up care is a key part of your treatment and safety. Be sure to make and go to all appointments, and call your doctor if you are having problems. It's also a good idea to know your test results and keep a list of the medicines you take.  How can you care for yourself at  home?  Avoid loud noises whenever possible. This helps keep your hearing from getting worse.  Always wear hearing protection around loud noises.  Wear a hearing aid as directed.  A professional can help you pick a hearing aid that will work best for you.  You can also get hearing aids over the counter for mild to moderate hearing loss.  Have hearing tests as your doctor suggests. They can show whether your hearing has changed. Your hearing aid may need to be adjusted.  Use other devices as needed. These may include:  Telephone amplifiers and hearing aids that can connect to a television, stereo, radio, or microphone.  Devices that use lights or vibrations. These alert you to the doorbell, a ringing telephone, or a baby monitor.  Television closed-captioning. This shows the words at the bottom of the screen. Most new TVs can do this.  TTY (text telephone). This lets you type messages back and forth on the telephone instead of talking or listening. These devices are also called TDD. When messages are typed on the keyboard, they are sent over the phone line to a receiving TTY. The message is shown on a monitor.  Use text messaging, social media, and email if it is hard for you to communicate by telephone.  Try to learn a listening technique called speechreading. It is not lipreading. You pay attention to people's gestures, expressions, posture, and tone of voice. These clues can help you understand what a person is saying. Face the person you are talking to, and have them face you. Make sure the lighting is good. You need to see the other person's face clearly.  Think about counseling if you need help to adjust to your hearing loss.  When should you call for help?  Watch closely for changes in your health, and be sure to contact your doctor if:    You think your hearing is getting worse.     You have new symptoms, such as  dizziness or nausea.   Where can you learn more?  Go to [employer] and  enter R798 to learn more about Hearing Loss: Care Instructions.  Current as of: December 14, 2022  Content Version: 14.5   [address], [place].   Care instructions adapted under license by [facility]. If you have questions about a medical condition or this instruction, always ask your healthcare professional. Romayne Alderman, [facility], disclaims any warranty or liability for your use of this information.         Starting a Weight-Loss Plan: Care Instructions  Overview    It can be a challenge to lose weight. But your doctor can help you make a weight-loss plan that meets your needs.  You don't have to make a lot of big changes at once. A better idea might be to focus on small changes and stick with them. When those changes become habit, you can add a few more changes.  Some people find it helpful to take an exercise or nutrition class. If you have questions, ask your doctor about seeing a registered dietitian or an exercise specialist. You might also think about joining a weight-loss support group.  If you're not ready to make changes right now, try to pick a date in the future. Then make an appointment with your doctor to talk about when and how you'll get started with a plan.  Follow-up care is a key part of your treatment and safety. Be sure to make and go to all appointments, and call your doctor if you are having problems. It's also a good idea to know your test results and keep a list of the medicines you take.  How can you care for yourself as you start a weight-loss plan?   Set realistic goals. Many people expect to lose much more weight than is likely. A weight loss of 5% to 10% of your body weight may be enough to improve your health.  Get family and friends involved to provide support. Talk to them about why you are trying to lose weight, and ask them to help. They can help by participating in exercise and having meals with you, even if they may be eating something different.  Find what  works best for you. If you do not have time or do not like to cook, a program that offers meal replacement bars or shakes may be better for you. Or if you like to prepare meals, finding a plan that includes daily menus and recipes may be best.  Ask your doctor about other health professionals who can help you achieve your weight-loss goals.  A dietitian can help you make healthy changes in your diet.  An exercise specialist or personal trainer can help you develop a safe and effective exercise program.  A counselor or psychiatrist can help you cope with issues such as depression, anxiety, or family problems that can make it hard to focus on weight loss.  Consider joining a support group for people who are trying to lose weight. Your doctor can suggest groups in your area.  Where can you learn more?  Go to [employer] and enter U357 to learn more about Starting a Weight-Loss Plan: Care Instructions.  Current as of: June 17, 2022  Content Version: 14.5   [address], [place].   Care instructions adapted under license by [facility]. If you have questions about a medical condition or this instruction, always ask your healthcare professional. [place], [facility], disclaims any warranty  or liability for your use of this information.         A Healthy Heart: Care Instructions  Overview     Coronary artery disease, also called heart disease, occurs when a substance called plaque builds up in the vessels that supply oxygen-rich blood to your heart muscle. This can narrow the blood vessels and reduce blood flow. A heart attack happens when blood flow is completely blocked. A high-fat diet, smoking, and other factors increase the risk of heart disease.  Your doctor has found that you have a chance of having heart disease. A heart-healthy lifestyle can help keep your heart healthy and prevent heart disease. This lifestyle includes eating healthy, being active, staying at a weight  that's healthy for you, and not smoking or using tobacco. It also includes taking medicines as directed, managing other health conditions, and trying to get a healthy amount of sleep.  Follow-up care is a key part of your treatment and safety. Be sure to make and go to all appointments, and call your doctor if you are having problems. It's also a good idea to know your test results and keep a list of the medicines you take.  How can you care for yourself at home?  Diet    Use less salt when you cook and eat. This helps lower your blood pressure. Taste food before salting. Add only a little salt when you think you need it. With time, your taste buds will adjust to less salt.     Eat fewer snack items, fast foods, canned soups, and other high-salt, high-fat, processed foods.     Read food labels and try to avoid saturated and trans fats. They increase your risk of heart disease by raising cholesterol levels.     Limit the amount of solid fat--butter, margarine, and shortening--you eat. Use olive, peanut, or canola oil when you cook. Bake, broil, and steam foods instead of frying them.     Eat a variety of fruit and vegetables every day. Dark green, deep orange, red, or yellow fruits and vegetables are especially good for you. Examples include spinach, carrots, peaches, and berries.     Foods high in fiber can reduce your cholesterol and provide important vitamins and minerals. High-fiber foods include whole-grain cereals and breads, oatmeal, beans, brown rice, citrus fruits, and apples.     Eat lean proteins. Heart-healthy proteins include seafood, lean meats and poultry, eggs, beans, peas, nuts, seeds, and soy products.     Limit drinks and foods with added sugar. These include candy, desserts, and soda pop.   Heart-healthy lifestyle    If your doctor recommends it, get more exercise. For many people, walking is a good choice. Or you may want to swim, bike, or do other activities. Bit by bit, increase the time you're  active every day. Try for at least 30 minutes on most days of the week.     Try to quit or cut back on using tobacco and other nicotine products. This includes smoking and vaping. If you need help quitting, talk to your doctor about stop-smoking programs and medicines. These can increase your chances of quitting for good. Quitting is one of the most important things you can do to protect your heart. It is never too late to quit. Try to avoid secondhand smoke too.     Stay at a weight that's healthy for you. Talk to your doctor if you need help losing weight.     Try to  get 7 to 9 hours of sleep each night.     Limit alcohol to 2 drinks a day for men and 1 drink a day for women. Too much alcohol can cause health problems.     Manage other health problems such as diabetes, high blood pressure, and high cholesterol. If you think you may have a problem with alcohol or drug use, talk to your doctor.   Medicines    Take your medicines exactly as prescribed. Call your doctor if you think you are having a problem with your medicine.     If your doctor recommends aspirin , take the amount directed each day. Make sure you take aspirin  and not another kind of pain reliever, such as acetaminophen  (Tylenol ).   When should you call for help?   Call 911 if you have symptoms of a heart attack. These may include:    Chest pain or pressure, or a strange feeling in the chest.     Sweating.     Shortness of breath.     Pain, pressure, or a strange feeling in the back, neck, jaw, or upper belly or in one or both shoulders or arms.     Lightheadedness or sudden weakness.     A fast or irregular heartbeat.   After you call 911, the operator may tell you to chew 1 adult-strength or 2 to 4 low-dose aspirin . Wait for an ambulance. Do not try to drive yourself.  Watch closely for changes in your health, and be sure to contact your doctor if you have any problems.  Where can you learn more?  Go to [employer] and enter  F075 to learn more about A Healthy Heart: Care Instructions.  Current as of: September 17, 2022  Content Version: 14.5   [address] [place].   Care instructions adapted under license by [facility]. If you have questions about a medical condition or this instruction, always ask your healthcare professional. Romayne Alderman, [facility], disclaims any warranty or liability for your use of this information.    Personalized Preventive Plan for William [name] - 09/18/2023  Medicare offers a range of preventive health benefits. Some of the tests and screenings are paid in full while other may be subject to a deductible, co-insurance, and/or copay.  Some of these benefits include a comprehensive review of your medical history including lifestyle, illnesses that may run in your family, and various assessments and screenings as appropriate.  After reviewing your medical record and screening and assessments performed today your provider may have ordered immunizations, labs, imaging, and/or referrals for you.  A list of these orders (if applicable) as well as your Preventive Care list are included within your After Visit Summary for your review.

## 2023-10-20 ENCOUNTER — Ambulatory Visit: Admit: 2023-10-20 | Discharge: 2023-10-20 | Payer: MEDICARE | Attending: Family Medicine | Primary: Family Medicine

## 2023-10-20 DIAGNOSIS — F419 Anxiety disorder, unspecified: Principal | ICD-10-CM

## 2023-10-20 MED ORDER — NITROGLYCERIN 0.4 MG SL SUBL
0.4 | ORAL_TABLET | SUBLINGUAL | 5 refills | 8.00000 days | Status: DC
Start: 2023-10-20 — End: 2023-12-22

## 2023-10-20 MED ORDER — CITALOPRAM HYDROBROMIDE 20 MG PO TABS
20 | ORAL_TABLET | Freq: Every day | ORAL | 0 refills | 90.00000 days | Status: DC
Start: 2023-10-20 — End: 2023-11-20

## 2023-10-20 NOTE — Progress Notes (Signed)
 William [name] (DOB:  1950/09/26) is a 73 y.o. male, here for evaluation of the following chief complaint(s):  Follow-up        Assessment & Plan   ASSESSMENT/PLAN:  1. Anxiety  -     citalopram  (CELEXA ) 20 MG tablet; Take 1 tablet by mouth daily, Disp-30 tablet, R-0Normal  2. Type 2 diabetes mellitus without complication, without long-term current use of insulin (HCC)  -     nitroGLYCERIN  (NITROSTAT ) 0.4 MG SL tablet; as directed Sublingual 1 tab under tongue q 5 minutes x 3 doses chest pain doe not help call 911, Disp-25 tablet, R-5Normal      Assessment & Plan  1. Anxiety:  - Minimal improvement in anxiety symptoms with the current 10 mg dose.  - Potential side effects, including initial discomfort that should subside within a week, were discussed. The side effect of decreased sexual desire was noted, and it will be monitored over the next two months.  - The dosage will be increased to 20 mg. Medication sent to pharmacy.       No follow-ups on file.       Subjective   SUBJECTIVE/OBJECTIVE:  HPI     History of Present Illness  The patient presents for evaluation of anxiety.    He reports no significant change in his condition, although his wife has observed a slight improvement. His primary concern is his anxiety while driving, which is exacerbated by other drivers' behaviors such as cutting in front of him or speeding. He did not experience any side effects when he initially started the medication, except for a decrease in sexual desire, which has not returned to its previous level. He is currently on a daily dose of 10 mg of medication.       Prior to Admission medications   Medication Sig Start Date End Date Taking? Authorizing Provider   citalopram  (CELEXA ) 20 MG tablet Take 1 tablet by mouth daily 10/20/23  Yes Rowe, Kimberla Driskill R Jr., MD   nitroGLYCERIN  (NITROSTAT ) 0.4 MG SL tablet as directed Sublingual 1 tab under tongue q 5 minutes x 3 doses chest pain doe not help call 911 10/20/23  Yes Rowe, Norleen JONELLE Raddle., MD    carvedilol  (COREG ) 6.25 MG tablet TAKE 1 TABLET TWICE A DAY  AS DIRECTED 09/18/23  Yes Rowe, Norleen JONELLE Raddle., MD   gabapentin  (NEURONTIN ) 300 MG capsule Take 1 capsule by mouth 2 times daily for 180 days. 09/18/23 03/16/24 Yes Rowe, Norleen JONELLE Raddle., MD   losartan  (COZAAR ) 100 MG tablet Take 1 tablet by mouth daily 09/18/23  Yes Rowe, Leyani Gargus R Jr., MD   omeprazole  (PRILOSEC) 20 MG delayed release capsule Take 1 capsule by mouth daily 09/18/23  Yes Rowe, Annalise Mcdiarmid R Jr., MD   rosuvastatin  (CRESTOR ) 40 MG tablet Take 1 tablet by mouth daily 09/18/23  Yes Rowe, Annaclaire Walsworth R Jr., MD   semaglutide , 2 MG/DOSE, (OZEMPIC , 2 MG/DOSE,) 8 MG/3ML SOPN sc injection Inject 2 mg into the skin every 7 days 09/18/23  Yes Rowe, Norleen JONELLE Raddle., MD   sildenafil  (VIAGRA ) 100 MG tablet Take 1 tablet by mouth daily as needed for Erectile Dysfunction 09/18/23  Yes Rowe, Norleen JONELLE Raddle., MD   diphenhydrAMINE -APAP, sleep, (TYLENOL  PM EXTRA STRENGTH) 25-500 MG tablet Take 1 tablet by mouth nightly as needed for Sleep   Yes [provider]   latanoprost  (XALATAN ) 0.005 % ophthalmic solution INSTILL 1 DROP IN BOTH EYES AT BEDTIME 02/26/23  Yes Kelley, Sarette J,  PA-C   Coenzyme Q10 (COQ-10) 200 MG CAPS Take 2 tablets by mouth nightly   Yes [provider]   Multiple Vitamins-Minerals (CENTRUM VITAMINTS PO) Take 1 tablet by mouth every other day   Yes [provider]   fluticasone  (FLONASE ) 50 MCG/ACT nasal spray 1 spray by Each Nostril route daily   Yes [provider]   aspirin  81 MG EC tablet Take 1 tablet by mouth daily   Yes [provider]   meloxicam  (MOBIC ) 15 MG tablet Take 1 tablet by mouth daily  Patient not taking: Reported on 10/20/2023 09/09/23 10/09/23  Charleen Alfreda PARAS, PA-C       No Known Allergies    Past Medical History:   Diagnosis Date    Eye pressure     GERD (gastroesophageal reflux disease)     Heart attack (HCC) 05/2021    Hyperlipidemia     Hypertension     Lumbar back pain     OSA on CPAP     Prediabetes     Type 2 diabetes  mellitus with hyperglycemia, without long-term current use of insulin (HCC) 04/08/2022       Past Surgical History:   Procedure Laterality Date    BACK SURGERY  1991    CARDIAC PROCEDURE N/A 08/21/2021    Left heart cath performed by Lynwood Lenon Sharps, MD at [facility] CARD[facility]AC [facility]    CARDIAC PROCEDURE N/A 08/21/2021    Insert stent des coronary performed by Lynwood Lenon Sharps, MD at [facility]ARDI[facility]ATH[facility]    CAROTID STENT  01/2018    COLONOSCOPY      KNEE CARTILAGE SURGERY      LUMBAR SPINE SURGERY N/A 04/16/2022    BILATERAL L3/4 LUMBAR DECOMPRESSION performed by Polo Norleen BIRCH, MD at [facility] MAIN OR    MOHS SURGERY Right 09/18/2022    [facility] rt side of face.    TONSILLECTOMY           Family History   Problem Relation Age of Onset    Alzheimer's Disease Mother         TIA    Heart Failure Father         DM    Diabetes Father         Died in 06-Nov-1994    Heart Failure Maternal Grandmother     No Known Problems Maternal Grandfather     No Known Problems Paternal Grandmother     No Known Problems Paternal Grandfather        Social History     Tobacco Use    Smoking status: Former     Current packs/day: 0.00     Average packs/day: 0.6 packs/day for 26.6 years (15.0 ttl pk-yrs)     Types: Cigarettes     Start date: 10/07/1966     Quit date: 11/06/1982     Years since quitting: 41.6    Smokeless tobacco: Never   Vaping Use    Vaping status: Never Used   Substance Use Topics    Alcohol use: Yes     Alcohol/week: 6.0 standard drinks of alcohol     Types: 6 Drinks containing 0.5 oz of alcohol per week    Drug use: Never          Review of Systems     Objective       Vitals:    10/20/23 1438   BP: 130/68   BP Site: Left Upper Arm  Patient Position: Sitting   BP Cuff Size: Large Adult   Pulse: 67   Resp: 12   Temp: 99.1 F (37.3 C)   TempSrc: Oral   SpO2: 98%   Weight: 115.4 kg (254 lb 6.4 oz)   Height: 1.905 m (6' 3)        BP Readings from Last 3 Encounters:   10/20/23 130/68   09/18/23 136/64   09/09/23 (!)  146/72     Wt Readings from Last 3 Encounters:   10/20/23 115.4 kg (254 lb 6.4 oz)   09/18/23 121.4 kg (267 lb 9.6 oz)   09/09/23 117.9 kg (259 lb 14.4 oz)       Physical Exam           Orders Only on 07/30/2023   Component Date Value Ref Range Status    PSA, Screening 07/30/2023 1.230  0.000 - 4.000 ng/mL Final    Comment: PSA INTERPRETATION:    PSA measured by [employer] electrochemiluminescence immunoassay ECLIA  methodology.    At this time, no major scientific/medical organization including the American  Cancer Society and the American Urological Association have specific  recommendations in regard to prostate specific antigen (PSA) testing other  than recommending that men at age 69 and above and those who are at high risk  at age 74-45 and above be counseled in regard to the pros and cons of PSA  testing.    Guidelines for risk stratification    1.  Below 1:  Very low risk of prostate cancer  2.  1-4:       Approximately 15% risk of prostate cancer  3.  4-10:      25% risk of prostate cancer  4.  >10:       50% risk of prostate cancer    The use of PSA velocity (rate of rise of PSA over time) may also be useful in  predicting the risk of prostate cancer.  Most authorities recommend PSA  testing on at least three occasions over a period of 18 months to get an  accurate PSA velocity.    References                            available by request.      TSH 07/30/2023 2.530  0.358 - 3.740 mcIU/mL Final    Comment: TSH INTERPRETATION:    Controversy exists over an acceptable TSH range. Some experts argue that a  narrower reference range is better and will increase the detection of thyroid  disease, particularly marginal hypothyroidism.      Hemoglobin A1C 07/30/2023 5.6  4.0 - 6.0 % Final    Comment: HEMOGLOBIN A1C INTERPRETATION:    The following arbitrary ranges may be used for interpretation of the results.  However, factors such as duration of diabetes, adherence to therapy, and  patient age should also be  considered in assessing degree of blood glucose  control.    Hemoglobin A1C                 Avg. Blood Sugar  --------------------------------------------------------------  6%                           135 mg/dL  7%                           170  mg/dL  8%                           205 mg/dL  9%                           240 mg/dL  89%                          275 mg/dL    ======================================================    A1C                      Glucose Control  ----------------------------------------------------------------  < 6.0 %                   Normal  6.0 - 6.9 %               Abnormal  7.0 - 7.9 %               Sub-Optimal Control  > 8.0 %                   Inadequate Control      Estimated Avg Glucose 07/30/2023 114   Final    Estimated Avg Glucose 07/30/2023 122   Final    Color, UA 07/30/2023 Yellow   Final    Appearance 07/30/2023 Clear   Final    Glucose, Ur 07/30/2023 Negative  Negative Final    Bilirubin, Urine 07/30/2023 Negative  Negative Final    Ketones, Urine 07/30/2023 Negative  Negative Final    Specific Gravity, UA 07/30/2023 1.020  1.003 - 1.035 Final    Blood, Urine 07/30/2023 Negative  Negative Final    pH, Urine 07/30/2023 7.0  4.5 - 8.0 Final    Protein, UA 07/30/2023 Negative  Negative Final    Urobilinogen, Urine 07/30/2023 1.0  0.2 - 1.0 EU/dL Final    Nitrite, Urine 07/30/2023 Negative  Negative Final    Leukocyte Esterase, Urine 07/30/2023 Negative  Negative Final    Cholesterol, Total 07/30/2023 122  100 - 200 mg/dL Final    Comment: The National Cholesterol Education Program has published reference  cholesterol values for cardiovascular risk to be:    Less than 200 mg/dL     = Low Risk    799 to 239 mg/dL        = Borderline Risk    240mg /dL and greater    = High Risk      HDL 07/30/2023 58  >=40 mg/dL Final    Comment: The National Lipid Association and the [employer] Cholesterol Education Program  (NCEP) have set the guidelines for high-density lipoprotein (HDL)  cholesterol  in adults ages 66 and up.      Triglycerides 07/30/2023 58  0 - 149 mg/dL Final    Comment:   TRIGLYCERIDE INTERPRETATION:                          Recommended Fasting Triglyc Levels for Adults                        =============================================                        Desirable                        <  150 mg/dL                        Average                          < 200 mg/dL                        Borderline High             200 to 500 mg/dL                        Hypertriglyceridemic             > 500 mg/dL                        =============================================      LDL Cholesterol 07/30/2023 52.4  0.0 - 100.0 mg/dL Final    Comment: [facility] utilize the [employer] equation for calculating LDL.    LDL = Total Cholesterol - HDL Cholesterol - (Triglyceride/5)      LDL/HDL Ratio 07/30/2023 0.9   Final    Chol/HDL Ratio 07/30/2023 2.1  0.0 - 4.4 Final    VLDL 07/30/2023 11.6  5.0 - 40.0 mg/dL Final    Sodium 93/87/7974 141  135 - 145 mmol/L Final    Potassium 07/30/2023 4.1  3.5 - 5.3 mmol/L Final    Chloride 07/30/2023 104  98 - 107 mmol/L Final    CO2 07/30/2023 27  22 - 29 mmol/L Final    Glucose 07/30/2023 99  70 - 99 mg/dL Final    BUN 93/87/7974 19  8 - 23 mg/dL Final    Creatinine 93/87/7974 0.9  0.7 - 1.3 mg/dL Final    Anion Gap 93/87/7974 10  2 - 17 mmol/L Final    Osmolaliy Calculated 07/30/2023 283  270 - 287 mOsm/kg Final    Calcium  07/30/2023 9.4  8.5 - 10.7 mg/dL Final    Total Protein 07/30/2023 6.5  5.7 - 8.3 g/dL Final    Albumin 93/87/7974 4.2  3.5 - 5.2 g/dL Final    Globulin 93/87/7974 2.3  1.9 - 4.4 g/dL Final    Albumin/Globulin Ratio 07/30/2023 1.80  1.00 - 2.70 Final    Total Bilirubin 07/30/2023 0.46  0.00 - 1.20 mg/dL Final    Alk Phosphatase 07/30/2023 43  40 - 130 unit/L Final    AST 07/30/2023 33  0 - 46 unit/L Final    ALT 07/30/2023 35  0 - 42 unit/L Final    Est, Glom Filt Rate 07/30/2023 90  >=60 mL/min/1.83m Final    Comment:  VERIFIED by Discern Expert.  GFR Interpretation:                                                                         % OF  KIDNEY  GFR  STAGE  FUNCTION  ==================================================================================    > 90        Normal kidney function                       STAGE 1  90-100%  89 to 60      Mild loss of kidney function                 STAGE 2  80-60%  59 to 45      Mild to moderate loss of kidney function     STAGE 3a  59-45%  44 to 30      Moderate to severe loss of kidney function   STAGE 3b  44-30%  29 to 15      Severe loss of kidney function               STAGE 4  29-15%    < 15        Kidney failure                               STAGE 5  <15%  ==================================================================================  Modified from National Kidney Foundation    GFR Calculation performed using the CKD-EPI 2021 equation developed for use  with IDMS traceable creatinine methods and                            is the calculation recommended by  the [facility] for estimating GFR in adults.      WBC 07/30/2023 4.8  3.8 - 10.6 x10e3/mcL Final    RBC 07/30/2023 3.94 (L)  4.00 - 5.60 x10e6/mcL Final    Hemoglobin 07/30/2023 11.9 (L)  13.0 - 17.3 g/dL Final    Hematocrit 93/87/7974 37.1 (L)  38.0 - 52.0 % Final    MCV 07/30/2023 94.2  84.0 - 100.0 fL Final    MCH 07/30/2023 30.2  27.0 - 34.5 pg Final    MCHC 07/30/2023 32.1  30.0 - 36.0 g/dL Final    RDW 93/87/7974 12.7  10.0 - 17.0 % Final    Platelets 07/30/2023 150  140 - 440 x10e3/mcL Final    MPV 07/30/2023 11.4  7.0 - 12.2 fL Final    NRBC Automated 07/30/2023 0.0  0.0 - 0.2 % Final    NRBC Absolute 07/30/2023 0.000  0.000 - 0.012 x10e3/mcL Final    Neutrophils % 07/30/2023 63.6  42.0 - 74.0 % Final    Lymphocytes 07/30/2023 24.2  15.0 - 45.0 % Final    Monocytes % 07/30/2023 9.3  4.0 - 12.0 % Final    Eosinophils % 07/30/2023 1.9  0.0 - 7.0 % Final     Basophils % 07/30/2023 0.8  0.0 - 2.0 % Final    Neutrophils Absolute 07/30/2023 3.1  1.6 - 7.3 x10e3/mcL Final    Lymphocytes Absolute 07/30/2023 1.2  1.0 - 3.2 x10e3/mcL Final    Monocytes Absolute 07/30/2023 0.5  0.3 - 1.0 x10e3/mcL Final    Eosinophils Absolute 07/30/2023 0.1  0.0 - 0.5 x10e3/mcL Final    Basophils Absolute 07/30/2023 0.0  0.0 - 0.2 x10e3/mcL Final    Immature Granulocytes % 07/30/2023 0.2  0.0 - 0.6 % Final    Immature Grans (Abs) 07/30/2023 0.01  0.00 - 0.06 x10e3/mcL Final      No results found for this visit on 10/20/23.  The patient (or guardian, if applicable) and other individuals in attendance with the patient were advised that Artificial Intelligence will be utilized during this visit to record and process the conversation to generate a clinical note. The patient (or guardian, if applicable) and other individuals in attendance at the appointment consented to the use of AI, including the recording.                          An electronic signature was used to authenticate this note.    --Norleen JONELLE Ethel Mickey, MD

## 2023-11-20 ENCOUNTER — Encounter

## 2023-11-20 MED ORDER — CITALOPRAM HYDROBROMIDE 20 MG PO TABS
20 | ORAL_TABLET | Freq: Every day | ORAL | 2 refills | 90.00000 days | Status: DC
Start: 2023-11-20 — End: 2023-12-14

## 2023-12-13 ENCOUNTER — Encounter

## 2023-12-14 MED ORDER — CITALOPRAM HYDROBROMIDE 20 MG PO TABS
20 | ORAL_TABLET | Freq: Every day | ORAL | 1 refills | 90.00000 days | Status: DC
Start: 2023-12-14 — End: 2024-01-26

## 2023-12-14 NOTE — Telephone Encounter (Signed)
"  Pls es  "

## 2023-12-15 ENCOUNTER — Encounter: Payer: MEDICARE | Attending: Family Medicine | Primary: Family Medicine

## 2023-12-22 ENCOUNTER — Ambulatory Visit: Admit: 2023-12-22 | Discharge: 2023-12-22 | Payer: MEDICARE | Attending: Family Medicine | Primary: Family Medicine

## 2023-12-22 ENCOUNTER — Encounter

## 2023-12-22 VITALS — BP 122/60 | HR 60 | Temp 99.00000°F | Ht 75.0 in | Wt 258.6 lb

## 2023-12-22 DIAGNOSIS — Z23 Encounter for immunization: Principal | ICD-10-CM

## 2023-12-22 MED ORDER — NITROGLYCERIN 0.4 MG SL SUBL
0.4 | ORAL_TABLET | SUBLINGUAL | 3 refills | Status: AC
Start: 2023-12-22 — End: ?

## 2023-12-22 MED ORDER — DESVENLAFAXINE SUCCINATE ER 25 MG PO TB24
25 | ORAL_TABLET | Freq: Every day | ORAL | 0 refills | 30.00000 days | Status: DC
Start: 2023-12-22 — End: 2024-01-26

## 2023-12-22 NOTE — Telephone Encounter (Signed)
"  Pls es  "

## 2023-12-22 NOTE — Progress Notes (Signed)
 "  William [name] (DOB:  1950-09-12) is a 73 y.o. male, here for evaluation of the following chief complaint(s):  Follow-up (Follow up.  no new concerns)        Assessment & Plan   ASSESSMENT/PLAN:  1. Need for influenza vaccination  -     Influenza, FLUZONE HIGH DOSE Trivalent, (age 3 y+), IM, Preservative Free, 0.5mL  2. Essential hypertension  -     desvenlafaxine  succinate (PRISTIQ ) 25 MG TB24 extended release tablet; Take 2 tablets by mouth daily, Disp-60 tablet, R-0Normal  -     CBC with Auto Differential; Future  -     Hemoglobin A1C; Future  -     Lipid Panel; Future  -     Albumin/Creatinine Ratio, Urine; Future  3. Acute bilateral low back pain with bilateral sciatica  4. Gastroesophageal reflux disease without esophagitis  5. Mixed hyperlipidemia  -     desvenlafaxine  succinate (PRISTIQ ) 25 MG TB24 extended release tablet; Take 2 tablets by mouth daily, Disp-60 tablet, R-0Normal  -     CBC with Auto Differential; Future  -     Hemoglobin A1C; Future  -     Lipid Panel; Future  -     Albumin/Creatinine Ratio, Urine; Future  6. Type 2 diabetes mellitus without complication, without long-term current use of insulin (HCC)  -     desvenlafaxine  succinate (PRISTIQ ) 25 MG TB24 extended release tablet; Take 2 tablets by mouth daily, Disp-60 tablet, R-0Normal  -     CBC with Auto Differential; Future  -     Hemoglobin A1C; Future  -     Lipid Panel; Future  -     Albumin/Creatinine Ratio, Urine; Future  7. Erectile dysfunction due to arterial insufficiency  8. Anxiety  -     desvenlafaxine  succinate (PRISTIQ ) 25 MG TB24 extended release tablet; Take 2 tablets by mouth daily, Disp-60 tablet, R-0Normal      Assessment & Plan  1. Anxiety:  - The current medication is not effective in managing anxiety, particularly while driving and during other activities such as watching the news.  - The patient will taper off the current medication by taking 10 mg for 3 days, then 5 mg for 2 days, and finally a quarter of a pill  for 2 days.  - After a 2-day break, Pristiq  25 mg will be started once daily for a week, followed by an increase to 2 tablets daily for the subsequent 3 weeks.  - Potential side effects, including nausea, queasiness, dry mouth, and rare difficulty passing urine, were discussed. If Pristiq  is not effective, alternative medications such as Paxil, Cymbalta , or Effexor will be considered.    2. Health maintenance:  - Blood work, including A1c and blood count, will be conducted on 01/29/2024. A urine test will also be performed.  - The last A1c was done on 07/30/2023.  - The last eye exam was on 10/14/2022, and he has upcoming appointments with Dr. Diona.    Follow-up: The patient will follow up in mid-December 2025.       No follow-ups on file.       Subjective   SUBJECTIVE/OBJECTIVE:  HPI     History of Present Illness  The patient presents for evaluation of anxiety.    He reports that his current medication regimen, which includes a 20 mg dose, has not been effective in managing his symptoms. Significant reactions continue while driving, and it is not apparent that he  is on any medication. This lack of improvement is also observed in other areas of his life, such as watching the news or changing television channels. He expresses a desire to discontinue the current medication and explore alternative treatment options. Additionally, he mentions a habit of biting the inside of his lip, particularly when driving, which he attributes to anxiety. To manage this, he typically resorts to using hard candy.    He has two upcoming appointments with Dr. Diona at [facility] and [facility] next month and in February. He sees her at least twice a year for pressure checks.       Prior to Admission medications   Medication Sig Start Date End Date Taking? Authorizing Provider   nitroGLYCERIN  (NITROSTAT ) 0.4 MG SL tablet DISSOLVE 1 TABLET UNDER THE  TONGUE EVERY 5 MINUTES AS NEEDED FOR CHEST PAIN. MAX OF 3 TABLETS IN 15 MINUTES. CALL  911 IF PAIN  PERSISTS. AS DIRECTED 12/22/23  Yes Kelley, Sarette J, PA-C   Magnesium  250 MG CAPS Take 250 mg by mouth in the morning and at bedtime   Yes [provider]   desvenlafaxine  succinate (PRISTIQ ) 25 MG TB24 extended release tablet Take 2 tablets by mouth daily 12/22/23  Yes Rowe, Shenaya Lebo R Jr., MD   citalopram  (CELEXA ) 20 MG tablet TAKE 1 TABLET BY MOUTH EVERY DAY 12/14/23  Yes Kelley, Sarette J, PA-C   latanoprost  (XALATAN ) 0.005 % ophthalmic solution INSTILL 1 DROP IN BOTH EYES AT BEDTIME 02/26/23  Yes Charleen, Sarette J, PA-C   Coenzyme Q10 (COQ-10) 200 MG CAPS Take 2 tablets by mouth nightly   Yes [provider]   Multiple Vitamins-Minerals (CENTRUM VITAMINTS PO) Take 1 tablet by mouth every other day   Yes [provider]   fluticasone  (FLONASE ) 50 MCG/ACT nasal spray 1 spray by Each Nostril route daily  Patient taking differently: 1 spray by Each Nostril route daily as needed   Yes [provider]   aspirin  81 MG EC tablet Take 1 tablet by mouth daily   Yes [provider]   carvedilol  (COREG ) 6.25 MG tablet TAKE 1 TABLET TWICE A DAY  AS DIRECTED 09/18/23   Ethel, Norleen JONELLE Raddle., MD   gabapentin  (NEURONTIN ) 300 MG capsule Take 1 capsule by mouth 2 times daily for 180 days. 09/18/23 03/16/24  Ethel Norleen JONELLE Raddle., MD   losartan  (COZAAR ) 100 MG tablet Take 1 tablet by mouth daily 09/18/23   Rowe, Juaquina Machnik R Jr., MD   omeprazole  [facility]) 20 MG delayed release capsule Take 1 capsule by mouth daily 09/18/23   Rowe, Marcia Lepera R Jr., MD   rosuvastatin  (CRESTOR ) 40 MG tablet Take 1 tablet by mouth daily 09/18/23   Rowe, Rod Majerus R Jr., MD   semaglutide , 2 MG/DOSE, (OZEMPIC , 2 MG/DOSE,) 8 MG/3ML SOPN sc injection Inject 2 mg into the skin every 7 days 09/18/23   Ethel Norleen JONELLE Raddle., MD   sildenafil  (VIAGRA ) 100 MG tablet Take 1 tablet by mouth daily as needed for Erectile Dysfunction 09/18/23   Ethel Norleen JONELLE Raddle., MD   diphenhydrAMINE -APAP, sleep, (TYLENOL  PM EXTRA STRENGTH) 25-500 MG tablet Take 1 tablet by  mouth nightly as needed for Sleep  Patient not taking: Reported on 12/22/2023    [provider]   meloxicam  (MOBIC ) 15 MG tablet Take 1 tablet by mouth daily  Patient not taking: Reported on 12/22/2023 09/09/23 10/09/23  Charleen Alfreda PARAS, PA-C       No Known Allergies  Past Medical History:   Diagnosis Date    Eye pressure     GERD (gastroesophageal reflux disease)     Heart attack (HCC) 05/2021    Hyperlipidemia     Hypertension     Lumbar back pain     OSA on CPAP     Prediabetes     Type 2 diabetes mellitus with hyperglycemia, without long-term current use of insulin (HCC) 04/08/2022       Past Surgical History:   Procedure Laterality Date    BACK SURGERY  1991    CARDIAC PROCEDURE N/A 08/21/2021    Left heart cath performed by Lynwood Lenon Sharps, MD at [facility] CARDIAC [facility]    CARDIAC PROCEDURE N/A 08/21/2021    Insert stent des coronary performed by Lynwood Lenon Sharps, MD at [facility] CARDI[facility]    CAROTID STENT  01/2018    COLONOSCOPY      KNEE CARTILAGE SURGERY      LUMBAR SPINE SURGERY N/A 04/16/2022    BILATERAL L3/4 LUMBAR DECOMPRESSION performed by Polo Norleen BIRCH, MD at [facility] MAIN OR    MOHS SURGERY Right 09/18/2022    [facility] rt side of face.    TONSILLECTOMY           Family History   Problem Relation Age of Onset    Alzheimer's Disease Mother         TIA    Heart Failure Father         DM    Diabetes Father         Died in 02/19/1994    Heart Failure Maternal Grandmother     No Known Problems Maternal Grandfather     No Known Problems Paternal Grandmother     No Known Problems Paternal Grandfather        Social History     Tobacco Use    Smoking status: Former     Current packs/day: 0.00     Average packs/day: 0.6 packs/day for 26.6 years (15.0 ttl pk-yrs)     Types: Cigarettes     Start date: 10/07/1966     Quit date: 1982/02/19     Years since quitting: 41.8    Smokeless tobacco: Never   Vaping Use    Vaping status: Never Used   Substance Use Topics    Alcohol use: Yes      Alcohol/week: 6.0 standard drinks of alcohol     Types: 6 Drinks containing 0.5 oz of alcohol per week    Drug use: Never          Review of Systems   All other systems reviewed and are negative.       Objective       Vitals:    12/22/23 1341   BP: 122/60   BP Site: Left Upper Arm   Patient Position: Sitting   BP Cuff Size: Large Adult   Pulse: 60   Temp: 99 F (37.2 C)   TempSrc: Oral   SpO2: 97%   Weight: 117.3 kg (258 lb 9.6 oz)   Height: 1.905 m (6' 3)        BP Readings from Last 3 Encounters:   12/22/23 122/60   10/20/23 130/68   09/18/23 136/64     Wt Readings from Last 3 Encounters:   12/22/23 117.3 kg (258 lb 9.6 oz)   10/20/23 115.4 kg (254 lb 6.4 oz)   09/18/23 121.4 kg (267 lb 9.6 oz)  Physical Exam  Vitals and nursing note reviewed.   Constitutional:       General: He is not in acute distress.  HENT:      Head: Normocephalic and atraumatic.   Cardiovascular:      Rate and Rhythm: Normal rate and regular rhythm.      Pulses: Normal pulses.      Heart sounds: Normal heart sounds. No murmur heard.  Pulmonary:      Breath sounds: Normal breath sounds.   Abdominal:      General: Abdomen is flat. Bowel sounds are normal.      Palpations: Abdomen is soft.   Neurological:      General: No focal deficit present.      Mental Status: He is alert and oriented to person, place, and time.                Orders Only on 07/30/2023   Component Date Value Ref Range Status    PSA, Screening 07/30/2023 1.230  0.000 - 4.000 ng/mL Final    Comment: PSA INTERPRETATION:    PSA measured by [employer] electrochemiluminescence immunoassay ECLIA  methodology.    At this time, no major scientific/medical organization including the American  Cancer Society and the American Urological Association have specific  recommendations in regard to prostate specific antigen (PSA) testing other  than recommending that men at age 8 and above and those who are at high risk  at age 15-45 and above be counseled in regard to the pros and  cons of PSA  testing.    Guidelines for risk stratification    1.  Below 1:  Very low risk of prostate cancer  2.  1-4:       Approximately 15% risk of prostate cancer  3.  4-10:      25% risk of prostate cancer  4.  >10:       50% risk of prostate cancer    The use of PSA velocity (rate of rise of PSA over time) may also be useful in  predicting the risk of prostate cancer.  Most authorities recommend PSA  testing on at least three occasions over a period of 18 months to get an  accurate PSA velocity.    References                            available by request.      TSH 07/30/2023 2.530  0.358 - 3.740 mcIU/mL Final    Comment: TSH INTERPRETATION:    Controversy exists over an acceptable TSH range. Some experts argue that a  narrower reference range is better and will increase the detection of thyroid  disease, particularly marginal hypothyroidism.      Hemoglobin A1C 07/30/2023 5.6  4.0 - 6.0 % Final    Comment: HEMOGLOBIN A1C INTERPRETATION:    The following arbitrary ranges may be used for interpretation of the results.  However, factors such as duration of diabetes, adherence to therapy, and  patient age should also be considered in assessing degree of blood glucose  control.    Hemoglobin A1C                 Avg. Blood Sugar  --------------------------------------------------------------  6%                           135 mg/dL  7%  170 mg/dL  8%                           205 mg/dL  9%                           240 mg/dL  89%                          275 mg/dL    ======================================================    A1C                      Glucose Control  ----------------------------------------------------------------  < 6.0 %                   Normal  6.0 - 6.9 %               Abnormal  7.0 - 7.9 %               Sub-Optimal Control  > 8.0 %                   Inadequate Control      Estimated Avg Glucose 07/30/2023 114   Final    Estimated Avg Glucose 07/30/2023 122   Final     Color, UA 07/30/2023 Yellow   Final    Appearance 07/30/2023 Clear   Final    Glucose, Ur 07/30/2023 Negative  Negative Final    Bilirubin, Urine 07/30/2023 Negative  Negative Final    Ketones, Urine 07/30/2023 Negative  Negative Final    Specific Gravity, UA 07/30/2023 1.020  1.003 - 1.035 Final    Blood, Urine 07/30/2023 Negative  Negative Final    pH, Urine 07/30/2023 7.0  4.5 - 8.0 Final    Protein, UA 07/30/2023 Negative  Negative Final    Urobilinogen, Urine 07/30/2023 1.0  0.2 - 1.0 EU/dL Final    Nitrite, Urine 07/30/2023 Negative  Negative Final    Leukocyte Esterase, Urine 07/30/2023 Negative  Negative Final    Cholesterol, Total 07/30/2023 122  100 - 200 mg/dL Final    Comment: The National Cholesterol Education Program has published reference  cholesterol values for cardiovascular risk to be:    Less than 200 mg/dL     = Low Risk    799 to 239 mg/dL        = Borderline Risk    240mg /dL and greater    = High Risk      HDL 07/30/2023 58  >=40 mg/dL Final    Comment: The National Lipid Association and the [employer] Cholesterol Education Program  (NCEP) have set the guidelines for high-density lipoprotein (HDL) cholesterol  in adults ages 73 and up.      Triglycerides 07/30/2023 58  0 - 149 mg/dL Final    Comment:   TRIGLYCERIDE INTERPRETATION:                          Recommended Fasting Triglyc Levels for Adults                        =============================================                        Desirable                        <  150 mg/dL                        Average                          < 200 mg/dL                        Borderline High             200 to 500 mg/dL                        Hypertriglyceridemic             > 500 mg/dL                        =============================================      LDL Cholesterol 07/30/2023 52.4  0.0 - 100.0 mg/dL Final    Comment: [facility] utilize the [employer] equation for calculating LDL.    LDL = Total Cholesterol - HDL Cholesterol -  (Triglyceride/5)      LDL/HDL Ratio 07/30/2023 0.9   Final    Chol/HDL Ratio 07/30/2023 2.1  0.0 - 4.4 Final    VLDL 07/30/2023 11.6  5.0 - 40.0 mg/dL Final    Sodium 93/87/7974 141  135 - 145 mmol/L Final    Potassium 07/30/2023 4.1  3.5 - 5.3 mmol/L Final    Chloride 07/30/2023 104  98 - 107 mmol/L Final    CO2 07/30/2023 27  22 - 29 mmol/L Final    Glucose 07/30/2023 99  70 - 99 mg/dL Final    BUN 93/87/7974 19  8 - 23 mg/dL Final    Creatinine 93/87/7974 0.9  0.7 - 1.3 mg/dL Final    Anion Gap 93/87/7974 10  2 - 17 mmol/L Final    Osmolaliy Calculated 07/30/2023 283  270 - 287 mOsm/kg Final    Calcium  07/30/2023 9.4  8.5 - 10.7 mg/dL Final    Total Protein 07/30/2023 6.5  5.7 - 8.3 g/dL Final    Albumin 93/87/7974 4.2  3.5 - 5.2 g/dL Final    Globulin 93/87/7974 2.3  1.9 - 4.4 g/dL Final    Albumin/Globulin Ratio 07/30/2023 1.80  1.00 - 2.70 Final    Total Bilirubin 07/30/2023 0.46  0.00 - 1.20 mg/dL Final    Alk Phosphatase 07/30/2023 43  40 - 130 unit/L Final    AST 07/30/2023 33  0 - 46 unit/L Final    ALT 07/30/2023 35  0 - 42 unit/L Final    Est, Glom Filt Rate 07/30/2023 90  >=60 mL/min/1.79m Final    Comment: VERIFIED by Discern Expert.  GFR Interpretation:                                                                         % OF  KIDNEY  GFR  STAGE  FUNCTION  ==================================================================================    > 90        Normal kidney function                       STAGE 1  90-100%  89 to 60      Mild loss of kidney function                 STAGE 2  80-60%  59 to 45      Mild to moderate loss of kidney function     STAGE 3a  59-45%  44 to 30      Moderate to severe loss of kidney function   STAGE 3b  44-30%  29 to 15      Severe loss of kidney function               STAGE 4  29-15%    < 15        Kidney failure                               STAGE  5  <15%  ==================================================================================  Modified from National Kidney Foundation    GFR Calculation performed using the CKD-EPI 2021 equation developed for use  with IDMS traceable creatinine methods and                            is the calculation recommended by  the [facility] for estimating GFR in adults.      WBC 07/30/2023 4.8  3.8 - 10.6 x10e3/mcL Final    RBC 07/30/2023 3.94 (L)  4.00 - 5.60 x10e6/mcL Final    Hemoglobin 07/30/2023 11.9 (L)  13.0 - 17.3 g/dL Final    Hematocrit 93/87/7974 37.1 (L)  38.0 - 52.0 % Final    MCV 07/30/2023 94.2  84.0 - 100.0 fL Final    MCH 07/30/2023 30.2  27.0 - 34.5 pg Final    MCHC 07/30/2023 32.1  30.0 - 36.0 g/dL Final    RDW 93/87/7974 12.7  10.0 - 17.0 % Final    Platelets 07/30/2023 150  140 - 440 x10e3/mcL Final    MPV 07/30/2023 11.4  7.0 - 12.2 fL Final    NRBC Automated 07/30/2023 0.0  0.0 - 0.2 % Final    NRBC Absolute 07/30/2023 0.000  0.000 - 0.012 x10e3/mcL Final    Neutrophils % 07/30/2023 63.6  42.0 - 74.0 % Final    Lymphocytes 07/30/2023 24.2  15.0 - 45.0 % Final    Monocytes % 07/30/2023 9.3  4.0 - 12.0 % Final    Eosinophils % 07/30/2023 1.9  0.0 - 7.0 % Final    Basophils % 07/30/2023 0.8  0.0 - 2.0 % Final    Neutrophils Absolute 07/30/2023 3.1  1.6 - 7.3 x10e3/mcL Final    Lymphocytes Absolute 07/30/2023 1.2  1.0 - 3.2 x10e3/mcL Final    Monocytes Absolute 07/30/2023 0.5  0.3 - 1.0 x10e3/mcL Final    Eosinophils Absolute 07/30/2023 0.1  0.0 - 0.5 x10e3/mcL Final    Basophils Absolute 07/30/2023 0.0  0.0 - 0.2 x10e3/mcL Final    Immature Granulocytes % 07/30/2023 0.2  0.0 - 0.6 % Final    Immature Grans (Abs) 07/30/2023 0.01  0.00 - 0.06 x10e3/mcL Final      No results found for this visit on 12/22/23.  The patient (or guardian, if applicable) and other individuals in attendance with the patient were advised that Artificial Intelligence will be utilized during this visit to record  and process the conversation to generate a clinical note. The patient (or guardian, if applicable) and other individuals in attendance at the appointment consented to the use of AI, including the recording.                          An electronic signature was used to authenticate this note.    --Norleen JONELLE Ethel Mickey, MD   "

## 2024-01-22 ENCOUNTER — Encounter

## 2024-01-22 LAB — ALBUMIN/CREATININE RATIO, URINE
Albumin Urine: <1.2 mg/dL (ref 0.0–20.0)
Creatinine, Ur: 101 mg/dL (ref 39.0–259.0)

## 2024-01-22 LAB — CBC WITH AUTO DIFFERENTIAL
Basophils %: 1 % (ref 0.0–2.0)
Basophils Absolute: 0 x10e3/mcL (ref 0.0–0.2)
Eosinophils %: 2.8 % (ref 0.0–7.0)
Eosinophils Absolute: 0.1 x10e3/mcL (ref 0.0–0.5)
Hematocrit: 37.5 % — ABNORMAL LOW (ref 38.0–52.0)
Hemoglobin: 12.2 g/dL — ABNORMAL LOW (ref 13.0–17.3)
Immature Grans (Abs): 0.02 x10e3/mcL (ref 0.00–0.06)
Immature Granulocytes %: 0.4 % (ref 0.0–0.6)
Lymphocytes Absolute: 1 x10e3/mcL (ref 1.0–3.2)
Lymphocytes: 20.5 % (ref 15.0–45.0)
MCH: 30.8 pg (ref 27.0–34.5)
MCHC: 32.5 g/dL (ref 30.0–36.0)
MCV: 94.7 fL (ref 84.0–100.0)
MPV: 11 fL (ref 7.0–12.2)
Monocytes %: 10.8 % (ref 4.0–12.0)
Monocytes Absolute: 0.5 x10e3/mcL (ref 0.3–1.0)
NRBC Absolute: 0 x10e3/mcL (ref 0.00–0.01)
NRBC Automated: 0 % (ref 0.0–0.2)
Neutrophils %: 64.5 % (ref 42.0–74.0)
Neutrophils Absolute: 3.2 x10e3/mcL (ref 1.6–7.3)
Platelets: 157 x10e3/mcL (ref 140–440)
RBC: 3.96 x10e6/mcL — ABNORMAL LOW (ref 4.00–5.60)
RDW: 12.8 % (ref 10.0–17.0)
WBC: 4.9 x10e3/mcL (ref 3.8–10.6)

## 2024-01-22 LAB — LIPID PANEL
Chol/HDL Ratio: 2 (ref 0.0–4.4)
Cholesterol, Total: 135 mg/dL (ref 100–200)
HDL: 66 mg/dL (ref >=40)
LDL Cholesterol: 58.4 mg/dL (ref 0.0–100.0)
LDL/HDL Ratio: 0.9
Triglycerides: 53 mg/dL (ref 0–149)
VLDL: 10.6 mg/dL (ref 5.0–40.0)

## 2024-01-22 LAB — HEMOGLOBIN A1C
Estimated Avg Glucose: 108
Estimated Avg Glucose: 115
Hemoglobin A1C: 5.4 % (ref 4.0–6.0)

## 2024-01-25 NOTE — Result Encounter Note (Signed)
"  We will review labs at upcoming appointment.Call us  if you need to reschedule.  "

## 2024-01-26 ENCOUNTER — Ambulatory Visit: Admit: 2024-01-26 | Discharge: 2024-01-26 | Payer: MEDICARE | Attending: Family Medicine | Primary: Family Medicine

## 2024-01-26 VITALS — BP 134/60 | HR 68 | Temp 98.10000°F | Ht 75.0 in | Wt 260.0 lb

## 2024-01-26 DIAGNOSIS — E119 Type 2 diabetes mellitus without complications: Principal | ICD-10-CM

## 2024-01-26 MED ORDER — LOSARTAN POTASSIUM 100 MG PO TABS
100 | ORAL_TABLET | Freq: Every day | ORAL | 1 refills | Status: AC
Start: 2024-01-26 — End: ?

## 2024-01-26 MED ORDER — CITALOPRAM HYDROBROMIDE 20 MG PO TABS
20 | ORAL_TABLET | Freq: Every day | ORAL | 1 refills | 90.00000 days | Status: DC
Start: 2024-01-26 — End: 2024-02-23

## 2024-01-26 MED ORDER — OMEPRAZOLE 20 MG PO CPDR
20 | ORAL_CAPSULE | Freq: Every day | ORAL | 1 refills | Status: AC
Start: 2024-01-26 — End: ?

## 2024-01-26 MED ORDER — ROSUVASTATIN CALCIUM 40 MG PO TABS
40 | ORAL_TABLET | Freq: Every day | ORAL | 1 refills | Status: AC
Start: 2024-01-26 — End: ?

## 2024-01-26 MED ORDER — GABAPENTIN 300 MG PO CAPS
300 | ORAL_CAPSULE | Freq: Two times a day (BID) | ORAL | 1 refills | Status: AC
Start: 2024-01-26 — End: 2024-07-24

## 2024-01-26 MED ORDER — CARVEDILOL 6.25 MG PO TABS
6.25 | ORAL_TABLET | ORAL | 1 refills | Status: AC
Start: 2024-01-26 — End: ?

## 2024-01-26 MED ORDER — OZEMPIC (2 MG/DOSE) 8 MG/3ML SC SOPN
8 | SUBCUTANEOUS | 1 refills | Status: AC
Start: 2024-01-26 — End: ?

## 2024-01-26 MED ORDER — DESVENLAFAXINE SUCCINATE ER 50 MG PO TB24
50 | ORAL_TABLET | Freq: Every day | ORAL | 3 refills | 90.00000 days | Status: DC
Start: 2024-01-26 — End: 2024-02-23

## 2024-01-26 NOTE — Progress Notes (Signed)
 "  William [name] (DOB:  06/14/1950) is a 73 y.o. male, here for evaluation of the following chief complaint(s):  Follow-up        Assessment & Plan   ASSESSMENT/PLAN:  1. Type 2 diabetes mellitus without complication, without long-term current use of insulin (HCC)  -     semaglutide , 2 MG/DOSE, (OZEMPIC , 2 MG/DOSE,) 8 MG/3ML SOPN sc injection; Inject 2 mg into the skin every 7 days, Disp-9 mL, R-1Normal  2. Mixed hyperlipidemia  -     rosuvastatin  (CRESTOR ) 40 MG tablet; Take 1 tablet by mouth daily, Disp-90 tablet, R-1Please send a replace/new response with 90-Day Supply if appropriate to maximize member benefit. Requesting 1 year supply.Normal  3. Gastroesophageal reflux disease without esophagitis  -     omeprazole  (PRILOSEC) 20 MG delayed release capsule; Take 1 capsule by mouth daily, Disp-90 capsule, R-1Please send a replace/new response with 90-Day Supply if appropriate to maximize member benefit. Requesting 1 year supply.Normal  4. Essential hypertension  -     losartan  (COZAAR ) 100 MG tablet; Take 1 tablet by mouth daily, Disp-90 tablet, R-1Please send a replace/new response with 90-Day Supply if appropriate to maximize member benefit. Requesting 1 year supply.Normal  -     carvedilol  (COREG ) 6.25 MG tablet; TAKE 1 TABLET TWICE A DAY  AS DIRECTED, Disp-180 tablet, R-1Normal  5. Acute bilateral low back pain with bilateral sciatica  -     gabapentin  (NEURONTIN ) 300 MG capsule; Take 1 capsule by mouth 2 times daily for 180 days., Disp-180 capsule, R-1Normal  6. Anxiety  -     citalopram  (CELEXA ) 20 MG tablet; Take 1 tablet by mouth daily, Disp-90 tablet, R-1Normal  -     desvenlafaxine  succinate (PRISTIQ ) 50 MG TB24 extended release tablet; Take 2 tablets by mouth daily, Disp-60 tablet, R-3Normal      Assessment & Plan  1. Anxiety:  - Anxiety symptoms have not improved with the current medication regimen of 50 mg, which has been taken for a month. Low sex drive is reported as a side effect.  -  Driving-related anxiety appears to have worsened with the current medication.  - Dosage will be increased to 75 mg for the next week, followed by an increase to 100 mg thereafter.  - He is advised to monitor for any side effects and report them promptly.    2. Anemia:  - Hemoglobin levels have increased from 12.3 to 12.6, with normal iron test results. This may indicate a new baseline.  - A complete blood count (CBC) will be conducted in 60 days to monitor his anemia.  - No blood work is required before next month's appointment.    3. Suspected mini stroke:  - An episode of near syncope occurred two months ago, before starting the current medication. Memory impairment was noted for several days following the incident.  - Blood pressure and pulse were within normal limits during this period. Motor function in all four extremities was retained, and he was able to smile.  - He is advised to inform the clinic immediately if a similar episode occurs again.    Follow-up: The patient will follow up in 30 days.       Return in about 4 weeks (around 02/23/2024).       Subjective   SUBJECTIVE/OBJECTIVE:  HPI     History of Present Illness  The patient presents for evaluation of anxiety. He is accompanied by his wife.    He reports that his  current anxiety medication, which he has been taking at a dosage of 50 mg for the past month, appears to be ineffective. He experiences persistent anxiety during driving and recalls an incident at a pharmacy where he felt extremely anxious while waiting in line. His wife notes that his driving behavior has become more reckless since starting the medication, although he disagrees with this assessment. He also reports a decrease in libido, which he attributes to the medication. He has approximately one week's supply of the medication remaining at home.    His wife recounts an episode that occurred two months ago, prior to the initiation of the medication, where he nearly fell while rising from  a chair. It required the assistance of two individuals to prevent the fall. He remained on the ground for some time and exhibited memory impairment for several days following the incident. His blood pressure and pulse were within normal limits during this period, and he retained motor function in all four extremities and was able to smile.       Prior to Admission medications   Medication Sig Start Date End Date Taking? Authorizing Provider   semaglutide , 2 MG/DOSE, (OZEMPIC , 2 MG/DOSE,) 8 MG/3ML SOPN sc injection Inject 2 mg into the skin every 7 days 01/26/24  Yes Rowe, Norleen JONELLE Raddle., MD   rosuvastatin  (CRESTOR ) 40 MG tablet Take 1 tablet by mouth daily 01/26/24  Yes Rowe, Maybel Dambrosio R Jr., MD   omeprazole  [facility]) 20 MG delayed release capsule Take 1 capsule by mouth daily 01/26/24  Yes Rowe, Dana Dorner R Jr., MD   losartan  (COZAAR ) 100 MG tablet Take 1 tablet by mouth daily 01/26/24  Yes Rowe, Ayde Record R Jr., MD   gabapentin  (NEURONTIN ) 300 MG capsule Take 1 capsule by mouth 2 times daily for 180 days. 01/26/24 07/24/24 Yes Rowe, Norleen JONELLE Raddle., MD   citalopram  (CELEXA ) 20 MG tablet Take 1 tablet by mouth daily 01/26/24  Yes Rowe, Taylyn Brame R Jr., MD   carvedilol  (COREG ) 6.25 MG tablet TAKE 1 TABLET TWICE A DAY  AS DIRECTED 01/26/24  Yes Rowe, Norleen JONELLE Raddle., MD   desvenlafaxine  succinate (PRISTIQ ) 50 MG TB24 extended release tablet Take 2 tablets by mouth daily 01/26/24  Yes Rowe, Wilberto Console R Jr., MD   nitroGLYCERIN  (NITROSTAT ) 0.4 MG SL tablet DISSOLVE 1 TABLET UNDER THE  TONGUE EVERY 5 MINUTES AS NEEDED FOR CHEST PAIN. MAX OF 3 TABLETS IN 15 MINUTES. CALL 911 IF PAIN  PERSISTS. AS DIRECTED 12/22/23  Yes Kelley, Sarette J, PA-C   Magnesium  250 MG CAPS Take 250 mg by mouth in the morning and at bedtime   Yes [provider]   sildenafil  (VIAGRA ) 100 MG tablet Take 1 tablet by mouth daily as needed for Erectile Dysfunction 09/18/23  Yes Rowe, Norleen JONELLE Raddle., MD   latanoprost  (XALATAN ) 0.005 % ophthalmic solution INSTILL 1 DROP IN BOTH EYES AT BEDTIME  02/26/23  Yes Charleen, Sarette J, PA-C   Coenzyme Q10 (COQ-10) 200 MG CAPS Take 2 tablets by mouth nightly   Yes [provider]   Multiple Vitamins-Minerals (CENTRUM VITAMINTS PO) Take 1 tablet by mouth every other day   Yes [provider]   fluticasone  (FLONASE ) 50 MCG/ACT nasal spray 1 spray by Each Nostril route daily   Yes [provider]   aspirin  81 MG EC tablet Take 1 tablet by mouth daily   Yes [provider]   diphenhydrAMINE -APAP, sleep, (TYLENOL  PM EXTRA STRENGTH) 25-500 MG tablet Take 1 tablet  by mouth nightly as needed for Sleep  Patient not taking: Reported on 12/22/2023    [provider]       No Known Allergies    Past Medical History:   Diagnosis Date    Eye pressure     GERD (gastroesophageal reflux disease)     Heart attack (HCC) 05/2021    Hyperlipidemia     Hypertension     Lumbar back pain     OSA on CPAP     Prediabetes     Type 2 diabetes mellitus with hyperglycemia, without long-term current use of insulin (HCC) 04/08/2022       Past Surgical History:   Procedure Laterality Date    BACK SURGERY  1991    CARDIAC PROCEDURE N/A 08/21/2021    Left heart cath performed by Lynwood Lenon Sharps, MD at [facility]ARD[facility]    CARDIAC PROCEDURE N/A 08/21/2021    Insert stent des coronary performed by Lynwood Lenon Sharps, MD at [facility]    CAROTID STENT  01/2018    COLONOSCOPY      KNEE CARTILAGE SURGERY      LUMBAR SPINE SURGERY N/A 04/16/2022    BILATERAL L3/4 LUMBAR DECOMPRESSION performed by Polo Norleen BIRCH, MD at [facility] MAIN OR    MOHS SURGERY Right 09/18/2022    [facility] rt side of face.    TONSILLECTOMY           Family History   Problem Relation Age of Onset    Alzheimer's Disease Mother         TIA    Heart Failure Father         DM    Diabetes Father         Died in 02/21/1994    Heart Failure Maternal Grandmother     No Known Problems Maternal Grandfather     No Known Problems Paternal Grandmother     No Known Problems  Paternal Grandfather        Social History     Tobacco Use    Smoking status: Former     Current packs/day: 0.00     Average packs/day: 0.6 packs/day for 26.6 years (15.0 ttl pk-yrs)     Types: Cigarettes     Start date: 10/07/1966     Quit date: 1982-02-21     Years since quitting: 41.9    Smokeless tobacco: Never   Vaping Use    Vaping status: Never Used   Substance Use Topics    Alcohol use: Yes     Alcohol/week: 6.0 standard drinks of alcohol     Types: 6 Drinks containing 0.5 oz of alcohol per week    Drug use: Never          Review of Systems   All other systems reviewed and are negative.       Objective       Vitals:    01/26/24 1411   BP: 134/60   Pulse: 68   Temp: 98.1 F (36.7 C)   SpO2: 98%   Weight: 117.9 kg (260 lb)   Height: 1.905 m (6' 3)        BP Readings from Last 3 Encounters:   01/26/24 134/60   12/22/23 122/60   10/20/23 130/68     Wt Readings from Last 3 Encounters:   01/26/24 117.9 kg (260 lb)   12/22/23 117.3 kg (258 lb 9.6 oz)   10/20/23 115.4 kg (  254 lb 6.4 oz)       Physical Exam  Vitals and nursing note reviewed.   Constitutional:       General: He is not in acute distress.  HENT:      Head: Normocephalic and atraumatic.   Cardiovascular:      Rate and Rhythm: Normal rate and regular rhythm.      Pulses: Normal pulses.      Heart sounds: Normal heart sounds. No murmur heard.  Pulmonary:      Breath sounds: Normal breath sounds.   Abdominal:      General: Abdomen is flat. Bowel sounds are normal.      Palpations: Abdomen is soft.   Neurological:      General: No focal deficit present.      Mental Status: He is alert and oriented to person, place, and time.                Orders Only on 01/22/2024   Component Date Value Ref Range Status    Albumin Urine 01/22/2024 <1.2  0.0 - 20.0 mg/dL Final    Comment: Notes/References:    This range represents a general estimate for most normal patients. More  specific ranges are not firmly established due to variability in urinary  albumin excretion. Refer  to Microalbumin/Creatinine Ratio for long term  monitoring.      Creatinine, Ur 01/22/2024 101.0  39.0 - 259.0 mg/dL Final    Albumin/Creatinine Ratio 01/22/2024 NOTE (A)  0 - 30 mg/g Cr Final    Comment: Unable to Calculate  Notes/References:     Microalbuminuria  = 30-300 mg/24hrs   Overt Proteinuria > 300 mg/24hrs     Follow-up testing on 2nd and 3rd, first morning specimens, is recommended to  confirm positive results.      Cholesterol, Total 01/22/2024 135  100 - 200 mg/dL Final    Comment: The National Cholesterol Education Program has published reference  cholesterol values for cardiovascular risk to be:    Less than 200 mg/dL     = Low Risk    799 to 239 mg/dL        = Borderline Risk    240mg /dL and greater    = High Risk      HDL 01/22/2024 66  >=40 mg/dL Final    Comment: The National Lipid Association and the [employer] Cholesterol Education Program  (NCEP) have set the guidelines for high-density lipoprotein (HDL) cholesterol  in adults ages 29 and up.      Triglycerides 01/22/2024 53  0 - 149 mg/dL Final    Comment:   TRIGLYCERIDE INTERPRETATION:                          Recommended Fasting Triglyc Levels for Adults                        =============================================                        Desirable                        < 150 mg/dL                        Average                          <  200 mg/dL                        Borderline High             200 to 500 mg/dL                        Hypertriglyceridemic             > 500 mg/dL                        =============================================      LDL Cholesterol 01/22/2024 58.4  0.0 - 100.0 mg/dL Final    Comment: [facility] utilize the [employer] equation for calculating LDL.    LDL = Total Cholesterol - HDL Cholesterol - (Triglyceride/5)      LDL/HDL Ratio 01/22/2024 0.9   Final    Chol/HDL Ratio 01/22/2024 2.0  0.0 - 4.4 Final    VLDL 01/22/2024 10.6  5.0 - 40.0 mg/dL Final    Hemoglobin J8R 01/22/2024 5.4  4.0 - 6.0  % Final    Comment: HEMOGLOBIN A1C INTERPRETATION:    The following arbitrary ranges may be used for interpretation of the results.  However, factors such as duration of diabetes, adherence to therapy, and  patient age should also be considered in assessing degree of blood glucose  control.    Hemoglobin A1C                 Avg. Blood Sugar  --------------------------------------------------------------  6%                           135 mg/dL  7%                           170 mg/dL  8%                           205 mg/dL  9%                           240 mg/dL  89%                          275 mg/dL    ======================================================    A1C                      Glucose Control  ----------------------------------------------------------------  < 6.0 %                   Normal  6.0 - 6.9 %               Abnormal  7.0 - 7.9 %               Sub-Optimal Control  > 8.0 %                   Inadequate Control      Estimated Avg Glucose 01/22/2024 108   Final    Estimated Avg Glucose 01/22/2024 115   Final    WBC 01/22/2024 4.9  3.8 - 10.6 x10e3/mcL Final    RBC 01/22/2024 3.96 (L)  4.00 - 5.60 x10e6/mcL Final    Hemoglobin 01/22/2024 12.2 (L)  13.0 -  17.3 g/dL Final    Hematocrit 87/94/7974 37.5 (L)  38.0 - 52.0 % Final    MCV 01/22/2024 94.7  84.0 - 100.0 fL Final    MCH 01/22/2024 30.8  27.0 - 34.5 pg Final    MCHC 01/22/2024 32.5  30.0 - 36.0 g/dL Final    RDW 87/94/7974 12.8  10.0 - 17.0 % Final    Platelets 01/22/2024 157  140 - 440 x10e3/mcL Final    MPV 01/22/2024 11.0  7.0 - 12.2 fL Final    NRBC Automated 01/22/2024 0.0  0.0 - 0.2 % Final    NRBC Absolute 01/22/2024 0.00  0.00 - 0.01 x10e3/mcL Final    Neutrophils % 01/22/2024 64.5  42.0 - 74.0 % Final    Lymphocytes 01/22/2024 20.5  15.0 - 45.0 % Final    Monocytes % 01/22/2024 10.8  4.0 - 12.0 % Final    Eosinophils % 01/22/2024 2.8  0.0 - 7.0 % Final    Basophils % 01/22/2024 1.0  0.0 - 2.0 % Final    Neutrophils Absolute 01/22/2024 3.2   1.6 - 7.3 x10e3/mcL Final    Lymphocytes Absolute 01/22/2024 1.0  1.0 - 3.2 x10e3/mcL Final    Monocytes Absolute 01/22/2024 0.5  0.3 - 1.0 x10e3/mcL Final    Eosinophils Absolute 01/22/2024 0.1  0.0 - 0.5 x10e3/mcL Final    Basophils Absolute 01/22/2024 0.0  0.0 - 0.2 x10e3/mcL Final    Immature Granulocytes % 01/22/2024 0.4  0.0 - 0.6 % Final    Immature Grans (Abs) 01/22/2024 0.02  0.00 - 0.06 x10e3/mcL Final      No results found for this visit on 01/26/24.     The patient (or guardian, if applicable) and other individuals in attendance with the patient were advised that Artificial Intelligence will be utilized during this visit to record and process the conversation to generate a clinical note. The patient (or guardian, if applicable) and other individuals in attendance at the appointment consented to the use of AI, including the recording.                          An electronic signature was used to authenticate this note.    --Norleen JONELLE Ethel Mickey, MD   "

## 2024-02-23 ENCOUNTER — Ambulatory Visit: Admit: 2024-02-23 | Discharge: 2024-02-23 | Payer: MEDICARE | Attending: Family Medicine | Primary: Family Medicine

## 2024-02-23 MED ORDER — DESVENLAFAXINE SUCCINATE ER 50 MG PO TB24
50 | ORAL_TABLET | Freq: Every day | ORAL | 3 refills | 90.00000 days | Status: DC
Start: 2024-02-23 — End: 2024-03-21

## 2024-02-23 NOTE — Assessment & Plan Note (Signed)
 Chronic, at goal (stable),

## 2024-02-23 NOTE — Progress Notes (Signed)
 William [name] (DOB:  [birth year]) is a 74 y.o. male, here for evaluation of the following chief complaint(s):  Follow-up (Follow up on increased medications.  Should pt be taking Citalopram ?  Pt saw pulm. this am for CPAP.)        Assessment & Plan   ASSESSMENT/PLAN:  1. Type 2 diabetes mellitus without complication, without long-term current use of insulin (HCC)  Assessment & Plan:   Chronic, at goal (stable),   2. Anxiety  -     desvenlafaxine  succinate (PRISTIQ ) 50 MG TB24 extended release tablet; Take 3 tablets by mouth daily, Disp-90 tablet, R-3Normal      Assessment & Plan  1.anxiety:  - The patient reports minimal improvement as noted by his wife, though he does not perceive much change himself.  - He is currently taking desvenlafaxine  50 mg, 2 tablets daily in the morning.  - The dosage of desvenlafaxine  will be increased to 3 tablets daily, with a discussion on the potential side effect of urinary retention and instructions to report any difficulty passing urine.  - A prescription for the adjusted dosage will be sent to the pharmacy. Monthly follow-ups are recommended until the optimal dosage is determined.    Follow-up: The patient will follow up in early February 2026.       Return in about 4 weeks (around 03/22/2024).       Subjective   SUBJECTIVE/OBJECTIVE:  HPI     History of Present Illness  The patient is a 73 year old male who presents for evaluation of depression.    He reports a slight improvement in his condition, although he does not perceive it as significant. His current medication regimen includes two 50 mg tablets of Pristiq  daily, taken in the morning. He is not currently taking Celexa . He has expressed a desire to increase the dosage of Pristiq . He underwent blood work last month.       Prior to Admission medications   Medication Sig Start Date End Date Taking? Authorizing Provider   desvenlafaxine  succinate (PRISTIQ ) 50 MG TB24 extended release tablet Take 3 tablets by mouth daily  02/23/24  Yes Rowe, Ebunoluwa Gernert R Jr., MD   semaglutide , 2 MG/DOSE, (OZEMPIC , 2 MG/DOSE,) 8 MG/3ML SOPN sc injection Inject 2 mg into the skin every 7 days 01/26/24  Yes Rowe, Norleen JONELLE Raddle., MD   rosuvastatin  (CRESTOR ) 40 MG tablet Take 1 tablet by mouth daily 01/26/24  Yes Rowe, Norleen JONELLE Raddle., MD   omeprazole  [facility]) 20 MG delayed release capsule Take 1 capsule by mouth daily 01/26/24  Yes Rowe, Derotha Fishbaugh R Jr., MD   losartan  (COZAAR ) 100 MG tablet Take 1 tablet by mouth daily 01/26/24  Yes Rowe, Elza Sortor R Jr., MD   gabapentin  (NEURONTIN ) 300 MG capsule Take 1 capsule by mouth 2 times daily for 180 days. 01/26/24 07/24/24 Yes Rowe, Norleen JONELLE Raddle., MD   carvedilol  (COREG ) 6.25 MG tablet TAKE 1 TABLET TWICE A DAY  AS DIRECTED 01/26/24  Yes Rowe, Norleen JONELLE Raddle., MD   nitroGLYCERIN  (NITROSTAT ) 0.4 MG SL tablet DISSOLVE 1 TABLET UNDER THE  TONGUE EVERY 5 MINUTES AS NEEDED FOR CHEST PAIN. MAX OF 3 TABLETS IN 15 MINUTES. CALL 911 IF PAIN  PERSISTS. AS DIRECTED 12/22/23  Yes Kelley, Sarette J, PA-C   Magnesium  250 MG CAPS Take 250 mg by mouth in the morning and at bedtime   Yes [provider]   sildenafil  (VIAGRA ) 100 MG tablet Take 1 tablet by mouth daily as needed for  Erectile Dysfunction 09/18/23  Yes Rowe, Norleen JONELLE Raddle., MD   diphenhydrAMINE -APAP, sleep, (TYLENOL  PM EXTRA STRENGTH) 25-500 MG tablet Take 1 tablet by mouth nightly as needed for Sleep   Yes [provider]   latanoprost  (XALATAN ) 0.005 % ophthalmic solution INSTILL 1 DROP IN BOTH EYES AT BEDTIME 02/26/23  Yes Charleen, Sarette J, PA-C   Coenzyme Q10 (COQ-10) 200 MG CAPS Take 2 tablets by mouth nightly   Yes [provider]   Multiple Vitamins-Minerals (CENTRUM VITAMINTS PO) Take 1 tablet by mouth every other day   Yes [provider]   fluticasone  (FLONASE ) 50 MCG/ACT nasal spray 1 spray by Each Nostril route daily   Yes [provider]   aspirin  81 MG EC tablet Take 1 tablet by mouth daily   Yes [provider]       No Known Allergies    Past  Medical History:   Diagnosis Date    Eye pressure     GERD (gastroesophageal reflux disease)     Heart attack (HCC) 05/2021    Hyperlipidemia     Hypertension     Lumbar back pain     OSA on CPAP     Prediabetes     Type 2 diabetes mellitus with hyperglycemia, without long-term current use of insulin (HCC) 04/08/2022       Past Surgical History:   Procedure Laterality Date    BACK SURGERY  1991    CARDIAC PROCEDURE N/A 08/21/2021    Left heart cath performed by Lynwood Lenon Sharps, MD at [facility]    CARDIAC PROCEDURE N/A 08/21/2021    Insert stent des coronary performed by Lynwood Lenon Sharps, MD at [facility]    CAROTID STENT  01/2018    COLONOSCOPY      KNEE CARTILAGE SURGERY      LUMBAR SPINE SURGERY N/A 04/16/2022    BILATERAL L3/4 LUMBAR DECOMPRESSION performed by Polo Norleen BIRCH, MD at [facility] MAIN OR    MOHS SURGERY Right 09/18/2022    [facility] rt side of face.    TONSILLECTOMY           Family History   Problem Relation Age of Onset    Alzheimer's Disease Mother         TIA    Heart Failure Father         DM    Diabetes Father         Died in 1994-02-28    Heart Failure Maternal Grandmother     No Known Problems Maternal Grandfather     No Known Problems Paternal Grandmother     No Known Problems Paternal Grandfather        Social History     Tobacco Use    Smoking status: Former     Current packs/day: 0.00     Average packs/day: 0.6 packs/day for 26.6 years (15.0 ttl pk-yrs)     Types: Cigarettes     Start date: 10/07/1966     Quit date: Feb 28, 1982     Years since quitting: 42.0    Smokeless tobacco: Never   Vaping Use    Vaping status: Never Used   Substance Use Topics    Alcohol use: Yes     Alcohol/week: 6.0 standard drinks of alcohol     Types: 6 Drinks containing 0.5 oz of alcohol per week    Drug use: Never  Review of Systems   All other systems reviewed and are negative.       Objective       Vitals:    02/23/24 1501   BP: 132/70   BP Site: Left Upper Arm   Patient  Position: Sitting   BP Cuff Size: Large Adult   Pulse: 70   Temp: 98.3 F (36.8 C)   TempSrc: Oral   SpO2: 97%   Weight: 120 kg (264 lb 9.6 oz)   Height: 1.905 m (6' 3)        BP Readings from Last 3 Encounters:   02/23/24 132/70   01/26/24 134/60   12/22/23 122/60     Wt Readings from Last 3 Encounters:   02/23/24 120 kg (264 lb 9.6 oz)   01/26/24 117.9 kg (260 lb)   12/22/23 117.3 kg (258 lb 9.6 oz)       Physical Exam  Vitals and nursing note reviewed.   Constitutional:       General: He is not in acute distress.  HENT:      Head: Normocephalic and atraumatic.   Cardiovascular:      Rate and Rhythm: Normal rate and regular rhythm.      Pulses: Normal pulses.      Heart sounds: Normal heart sounds. No murmur heard.  Pulmonary:      Breath sounds: Normal breath sounds.   Abdominal:      General: Abdomen is flat. Bowel sounds are normal.      Palpations: Abdomen is soft.   Neurological:      General: No focal deficit present.      Mental Status: He is alert and oriented to person, place, and time.                Orders Only on 01/22/2024   Component Date Value Ref Range Status    Albumin Urine 01/22/2024 <1.2  0.0 - 20.0 mg/dL Final    Comment: Notes/References:    This range represents a general estimate for most normal patients. More  specific ranges are not firmly established due to variability in urinary  albumin excretion. Refer to Microalbumin/Creatinine Ratio for long term  monitoring.      Creatinine, Ur 01/22/2024 101.0  39.0 - 259.0 mg/dL Final    Albumin/Creatinine Ratio 01/22/2024 NOTE (A)  0 - 30 mg/g Cr Final    Comment: Unable to Calculate  Notes/References:     Microalbuminuria  = 30-300 mg/24hrs   Overt Proteinuria > 300 mg/24hrs     Follow-up testing on 2nd and 3rd, first morning specimens, is recommended to  confirm positive results.      Cholesterol, Total 01/22/2024 135  100 - 200 mg/dL Final    Comment: The National Cholesterol Education Program has published reference  cholesterol values for  cardiovascular risk to be:    Less than 200 mg/dL     = Low Risk    799 to 239 mg/dL        = Borderline Risk    240mg /dL and greater    = High Risk      HDL 01/22/2024 66  >=40 mg/dL Final    Comment: The National Lipid Association and the [employer] Cholesterol Education Program  (NCEP) have set the guidelines for high-density lipoprotein (HDL) cholesterol  in adults ages 37 and up.      Triglycerides 01/22/2024 53  0 - 149 mg/dL Final    Comment:   TRIGLYCERIDE INTERPRETATION:  Recommended Fasting Triglyc Levels for Adults                        =============================================                        Desirable                        < 150 mg/dL                        Average                          < 200 mg/dL                        Borderline High             200 to 500 mg/dL                        Hypertriglyceridemic             > 500 mg/dL                        =============================================      LDL Cholesterol 01/22/2024 58.4  0.0 - 100.0 mg/dL Final    Comment: [facility] utilize the [employer] equation for calculating LDL.    LDL = Total Cholesterol - HDL Cholesterol - (Triglyceride/5)      LDL/HDL Ratio 01/22/2024 0.9   Final    Chol/HDL Ratio 01/22/2024 2.0  0.0 - 4.4 Final    VLDL 01/22/2024 10.6  5.0 - 40.0 mg/dL Final    Hemoglobin J8R 01/22/2024 5.4  4.0 - 6.0 % Final    Comment: HEMOGLOBIN A1C INTERPRETATION:    The following arbitrary ranges may be used for interpretation of the results.  However, factors such as duration of diabetes, adherence to therapy, and  patient age should also be considered in assessing degree of blood glucose  control.    Hemoglobin A1C                 Avg. Blood Sugar  --------------------------------------------------------------  6%                           135 mg/dL  7%                           170 mg/dL  8%                           205 mg/dL  9%                           240 mg/dL  89%                           275 mg/dL    ======================================================    A1C                      Glucose Control  ----------------------------------------------------------------  < 6.0 %  Normal  6.0 - 6.9 %               Abnormal  7.0 - 7.9 %               Sub-Optimal Control  > 8.0 %                   Inadequate Control      Estimated Avg Glucose 01/22/2024 108   Final    Estimated Avg Glucose 01/22/2024 115   Final    WBC 01/22/2024 4.9  3.8 - 10.6 x10e3/mcL Final    RBC 01/22/2024 3.96 (L)  4.00 - 5.60 x10e6/mcL Final    Hemoglobin 01/22/2024 12.2 (L)  13.0 - 17.3 g/dL Final    Hematocrit 87/94/7974 37.5 (L)  38.0 - 52.0 % Final    MCV 01/22/2024 94.7  84.0 - 100.0 fL Final    MCH 01/22/2024 30.8  27.0 - 34.5 pg Final    MCHC 01/22/2024 32.5  30.0 - 36.0 g/dL Final    RDW 87/94/7974 12.8  10.0 - 17.0 % Final    Platelets 01/22/2024 157  140 - 440 x10e3/mcL Final    MPV 01/22/2024 11.0  7.0 - 12.2 fL Final    NRBC Automated 01/22/2024 0.0  0.0 - 0.2 % Final    NRBC Absolute 01/22/2024 0.00  0.00 - 0.01 x10e3/mcL Final    Neutrophils % 01/22/2024 64.5  42.0 - 74.0 % Final    Lymphocytes 01/22/2024 20.5  15.0 - 45.0 % Final    Monocytes % 01/22/2024 10.8  4.0 - 12.0 % Final    Eosinophils % 01/22/2024 2.8  0.0 - 7.0 % Final    Basophils % 01/22/2024 1.0  0.0 - 2.0 % Final    Neutrophils Absolute 01/22/2024 3.2  1.6 - 7.3 x10e3/mcL Final    Lymphocytes Absolute 01/22/2024 1.0  1.0 - 3.2 x10e3/mcL Final    Monocytes Absolute 01/22/2024 0.5  0.3 - 1.0 x10e3/mcL Final    Eosinophils Absolute 01/22/2024 0.1  0.0 - 0.5 x10e3/mcL Final    Basophils Absolute 01/22/2024 0.0  0.0 - 0.2 x10e3/mcL Final    Immature Granulocytes % 01/22/2024 0.4  0.0 - 0.6 % Final    Immature Grans (Abs) 01/22/2024 0.02  0.00 - 0.06 x10e3/mcL Final      No results found for this visit on 02/23/24.     The patient (or guardian, if applicable) and other individuals in attendance with the patient were advised that Artificial  Intelligence will be utilized during this visit to record and process the conversation to generate a clinical note. The patient (or guardian, if applicable) and other individuals in attendance at the appointment consented to the use of AI, including the recording.                          An electronic signature was used to authenticate this note.    --Norleen JONELLE Ethel Mickey, MD

## 2024-03-09 ENCOUNTER — Encounter

## 2024-03-09 NOTE — Telephone Encounter (Signed)
 error

## 2024-03-13 ENCOUNTER — Ambulatory Visit: Admit: 2024-03-13 | Discharge: 2024-03-13 | Payer: MEDICARE | Attending: Physician Assistant | Primary: Family Medicine

## 2024-03-13 VITALS — BP 138/80 | HR 67 | Temp 98.40000°F | Resp 16 | Ht 75.0 in | Wt 264.0 lb

## 2024-03-13 DIAGNOSIS — S50812A Abrasion of left forearm, initial encounter: Principal | ICD-10-CM

## 2024-03-13 MED ORDER — CEPHALEXIN 500 MG PO CAPS
500 | ORAL_CAPSULE | Freq: Two times a day (BID) | ORAL | 0 refills | Status: AC
Start: 2024-03-13 — End: 2024-03-23

## 2024-03-13 MED ORDER — MUPIROCIN 2 % EX OINT
2 | CUTANEOUS | 0 refills | Status: AC
Start: 2024-03-13 — End: ?

## 2024-03-13 NOTE — Progress Notes (Signed)
 William [name] (DOB:  [birth year]) is a 74 y.o. male,Established patient, here for evaluation of the following chief complaint(s):  Fall (FELL LAST WEDS SCRAPPED SKIN ON LEFT ARM NOW RED AND SWOLLEN)      Assessment & Plan :  Visit Diagnoses and Associated Orders         Infected abrasion of left forearm, initial encounter    -  Primary    cephALEXin  (KEFLEX ) 500 MG capsule [9500]      mupirocin  (BACTROBAN ) 2 % ointment [10674]                      Subjective :  73 YO MALE PRESENT TO [facility] WITH NEW ABRASION LEFT FOREARM.  STATES HE SLIPPED DOWN A SMALL EMBANKMENT LAST WEEK WHILE PLAYING GOLF.   ADMITS HE SCRAPED HIS LEFT FOREARM.  HE ADMITS NO PAIN IN HIS ARM OVER LAST WEEK BUT OVER LAST 24H ADMITS REDNESS TO ABRASION AND SOME SWELLING AROUND ABRASION. HE IS CONCERNED FOR INFECTION. FEELS WELL OTHERWISE. HE DID NOT HIT HEAD DURING FALL.  NO RESTING LUE PAIN.   NO F/C/N/V.  NO HX OF RECURRENT SKIN IFX      Fall      Vitals:    03/13/24 0959   BP: 138/80   Pulse: 67   Resp: 16   Temp: 98.4 F (36.9 C)   SpO2: 98%   Weight: 119.7 kg (264 lb)   Height: 1.905 m (6' 3)       No results found for this visit on 03/13/24.      Objective   Physical Exam  Vitals reviewed.   Constitutional:       General: He is not in acute distress.     Appearance: Normal appearance. He is not ill-appearing or toxic-appearing.   HENT:      Head: Normocephalic and atraumatic.   Cardiovascular:      Rate and Rhythm: Normal rate and regular rhythm.   Pulmonary:      Effort: Pulmonary effort is normal.      Breath sounds: Normal breath sounds.   Musculoskeletal:      Left shoulder: Normal.      Left upper arm: Normal.      Left elbow: Normal.      Left forearm: Swelling and tenderness present. No edema, deformity or bony tenderness.      Cervical back: Full passive range of motion without pain, normal range of motion and neck supple. No signs of trauma. No pain with movement, spinous process tenderness or muscular tenderness.      Comments:  NOTED LARGE 13X3 CM ABRASION VOLAR ASPECT LEFT FOREARM.   DRESSING WAS REMOVED AND MACERATED TISSUE WAS OBSERVED. NO ACTIVE WEEPING OR DISCHARGE.  LOCALIZED TTP WITH 1 CM HALO OF ERYTHEMA IS PRESENT.  NO FLUID COLLECTION OR ABSCESS IS OBSERVED.      Skin:     General: Skin is warm and dry.   Neurological:      General: No focal deficit present.      Mental Status: He is alert and oriented to person, place, and time.   Psychiatric:         Mood and Affect: Mood normal.     SEE MEDIA PHOTO   KEEP WOUND CLEAN AND DRY.  OBSERVE FOR GRADUAL IMPROVEMENT OVER NEXT COUPLE OF DAYS.  MAY KEEP WOUND OPEN TO AIR WHILE AT HOME.   RTC FOR NEW/WORSE SYMPTOMS, NEW FLUCTUANT REGION, NEW T>100.4,  UNWANTED SE WITH ABX.  IF SYMPTOMS BECOME WORSE, YOU MAY NEED RE-EVALUATION  COMPLETE COURSE OF ANTIBIOTICS, AS WRITTEN, UNTIL GONE     An electronic signature was used to authenticate this note.    Elveria LOISE Sharps, PA-C

## 2024-03-13 NOTE — Patient Instructions (Signed)
 KEEP WOUND CLEAN AND DRY.  OBSERVE FOR GRADUAL IMPROVEMENT OVER NEXT COUPLE OF DAYS.  MAY KEEP WOUND OPEN TO AIR WHILE AT HOME.   RTC FOR NEW/WORSE SYMPTOMS, NEW FLUCTUANT REGION, NEW T>100.4, UNWANTED SE WITH ABX.  IF SYMPTOMS BECOME WORSE, YOU MAY NEED RE-EVALUATION  COMPLETE COURSE OF ANTIBIOTICS, AS WRITTEN, UNTIL GONE

## 2024-03-21 ENCOUNTER — Ambulatory Visit: Admit: 2024-03-21 | Discharge: 2024-03-21 | Payer: MEDICARE | Attending: Family Medicine | Primary: Family Medicine

## 2024-03-21 DIAGNOSIS — E119 Type 2 diabetes mellitus without complications: Principal | ICD-10-CM

## 2024-03-21 MED ORDER — MUPIROCIN 2 % EX OINT
2 | CUTANEOUS | 0 refills | 11.00000 days | Status: AC
Start: 2024-03-21 — End: 2024-03-28

## 2024-03-21 MED ORDER — DESVENLAFAXINE SUCCINATE ER 50 MG PO TB24
50 | ORAL_TABLET | Freq: Every day | ORAL | 3 refills | 90.00000 days | Status: AC
Start: 2024-03-21 — End: ?
